# Patient Record
Sex: Female | Born: 1970
Health system: Southern US, Community
[De-identification: ages and names within clinical notes are randomized; demographics above are authoritative.]

## PROBLEM LIST (undated history)

## (undated) ENCOUNTER — Ambulatory Visit: Admission: EM | Payer: Self-pay

## (undated) DIAGNOSIS — H9011 Conductive hearing loss, unilateral, right ear, with unrestricted hearing on the contralateral side: Secondary | ICD-10-CM

## (undated) DIAGNOSIS — M0820 Juvenile rheumatoid arthritis with systemic onset, unspecified site: Secondary | ICD-10-CM

## (undated) DIAGNOSIS — F32A Depression, unspecified: Secondary | ICD-10-CM

## (undated) DIAGNOSIS — M1990 Unspecified osteoarthritis, unspecified site: Secondary | ICD-10-CM

## (undated) DIAGNOSIS — H9012 Conductive hearing loss, unilateral, left ear, with unrestricted hearing on the contralateral side: Secondary | ICD-10-CM

## (undated) DIAGNOSIS — F909 Attention-deficit hyperactivity disorder, unspecified type: Secondary | ICD-10-CM

## (undated) DIAGNOSIS — F329 Major depressive disorder, single episode, unspecified: Secondary | ICD-10-CM

## (undated) DIAGNOSIS — T7840XA Allergy, unspecified, initial encounter: Secondary | ICD-10-CM

## (undated) DIAGNOSIS — E785 Hyperlipidemia, unspecified: Secondary | ICD-10-CM

## (undated) HISTORY — DX: Juvenile rheumatoid arthritis with systemic onset, unspecified site: M08.20

## (undated) HISTORY — DX: Depression, unspecified: F32.A

## (undated) HISTORY — DX: Hyperlipidemia, unspecified: E78.5

## (undated) HISTORY — DX: Allergy, unspecified, initial encounter: T78.40XA

## (undated) HISTORY — DX: Unspecified osteoarthritis, unspecified site: M19.90

## (undated) HISTORY — DX: Major depressive disorder, single episode, unspecified: F32.9

## (undated) HISTORY — DX: Conductive hearing loss, unilateral, left ear, with unrestricted hearing on the contralateral side: H90.12

## (undated) HISTORY — DX: Attention-deficit hyperactivity disorder, unspecified type: F90.9

## (undated) HISTORY — DX: Conductive hearing loss, unilateral, right ear, with unrestricted hearing on the contralateral side: H90.11

## (undated) HISTORY — PX: TUBAL LIGATION: SHX77

---

## 1997-07-09 ENCOUNTER — Other Ambulatory Visit: Admission: RE | Admit: 1997-07-09 | Discharge: 1997-07-09 | Payer: Self-pay | Admitting: Obstetrics and Gynecology

## 1997-09-20 ENCOUNTER — Inpatient Hospital Stay (HOSPITAL_COMMUNITY): Admission: AD | Admit: 1997-09-20 | Discharge: 1997-09-20 | Payer: Self-pay | Admitting: Obstetrics and Gynecology

## 1997-11-16 ENCOUNTER — Ambulatory Visit (HOSPITAL_COMMUNITY): Admission: RE | Admit: 1997-11-16 | Discharge: 1997-11-16 | Payer: Self-pay | Admitting: Obstetrics and Gynecology

## 1998-02-01 ENCOUNTER — Inpatient Hospital Stay (HOSPITAL_COMMUNITY): Admission: AD | Admit: 1998-02-01 | Discharge: 1998-02-04 | Payer: Self-pay | Admitting: Obstetrics and Gynecology

## 1998-03-28 ENCOUNTER — Other Ambulatory Visit: Admission: RE | Admit: 1998-03-28 | Discharge: 1998-03-28 | Payer: Self-pay | Admitting: Obstetrics and Gynecology

## 2001-11-18 ENCOUNTER — Other Ambulatory Visit: Admission: RE | Admit: 2001-11-18 | Discharge: 2001-11-18 | Payer: Self-pay | Admitting: Obstetrics and Gynecology

## 2003-03-29 ENCOUNTER — Other Ambulatory Visit: Admission: RE | Admit: 2003-03-29 | Discharge: 2003-03-29 | Payer: Self-pay | Admitting: Obstetrics and Gynecology

## 2003-05-01 HISTORY — PX: SEPTOPLASTY: SUR1290

## 2004-04-28 ENCOUNTER — Ambulatory Visit: Payer: Self-pay | Admitting: Unknown Physician Specialty

## 2004-06-02 ENCOUNTER — Other Ambulatory Visit: Admission: RE | Admit: 2004-06-02 | Discharge: 2004-06-02 | Payer: Self-pay | Admitting: Obstetrics and Gynecology

## 2005-04-30 HISTORY — PX: LAPAROSCOPIC ENDOMETRIOSIS FULGURATION: SUR769

## 2007-06-07 ENCOUNTER — Ambulatory Visit: Payer: Self-pay | Admitting: Emergency Medicine

## 2014-08-30 ENCOUNTER — Other Ambulatory Visit: Payer: Self-pay | Admitting: Otolaryngology

## 2014-08-30 DIAGNOSIS — E041 Nontoxic single thyroid nodule: Secondary | ICD-10-CM

## 2014-08-31 ENCOUNTER — Ambulatory Visit
Admission: RE | Admit: 2014-08-31 | Discharge: 2014-08-31 | Disposition: A | Discharge status: Home / Self Care | Payer: BLUE CROSS/BLUE SHIELD | Source: Ambulatory Visit | Attending: Otolaryngology | Admitting: Otolaryngology

## 2014-08-31 DIAGNOSIS — E041 Nontoxic single thyroid nodule: Secondary | ICD-10-CM | POA: Diagnosis present

## 2014-08-31 DIAGNOSIS — E042 Nontoxic multinodular goiter: Secondary | ICD-10-CM | POA: Diagnosis not present

## 2014-12-30 ENCOUNTER — Other Ambulatory Visit: Payer: Self-pay | Admitting: Family Medicine

## 2014-12-30 ENCOUNTER — Telehealth: Payer: Self-pay

## 2014-12-30 MED ORDER — AMPHETAMINE-DEXTROAMPHETAMINE 20 MG PO TABS: 20.0000 mg | ORAL_TABLET | Freq: Two times a day (BID) | ORAL | Status: DC

## 2014-12-30 NOTE — Telephone Encounter (Signed)
Refill for Amphetamine Salts 20 mg 1tab BID  [address]

## 2015-03-30 ENCOUNTER — Other Ambulatory Visit: Payer: Self-pay | Admitting: Family Medicine

## 2015-07-20 ENCOUNTER — Other Ambulatory Visit: Payer: Self-pay | Admitting: Family Medicine

## 2015-08-05 ENCOUNTER — Telehealth: Payer: Self-pay | Admitting: Family Medicine

## 2015-08-05 NOTE — Telephone Encounter (Signed)
Pt came in looking for refill on Adderrall. No RX up front. Pt very upset. Pt has not been seen since May 2016. Dr. Laural BenesJohnson refused to write. Please call when RX is ready or please advise of next steps. Thanks.

## 2015-08-08 ENCOUNTER — Other Ambulatory Visit: Payer: Self-pay | Admitting: Family Medicine

## 2015-08-08 MED ORDER — AMPHETAMINE-DEXTROAMPHETAMINE 20 MG PO TABS: 20.0000 mg | ORAL_TABLET | Freq: Two times a day (BID) | ORAL | Status: DC

## 2015-08-08 NOTE — Telephone Encounter (Signed)
No answer, will call later (left VM)

## 2015-08-08 NOTE — Telephone Encounter (Signed)
Please check phone as pt waited 9.5 min for phone to be answered today Girl at front dest very poor.

## 2015-08-08 NOTE — Telephone Encounter (Signed)
Call pt 

## 2015-08-15 NOTE — Telephone Encounter (Signed)
Dr. Dossie Arbourrissman,  I will reach out to the patient to discuss her concerns.  I would also like to speak with you regarding Stephanie's feedback as well.  I'll try to catch you for a few minutes during lunch.  Thanks!

## 2015-08-30 ENCOUNTER — Other Ambulatory Visit: Payer: Self-pay | Admitting: Otolaryngology

## 2015-08-30 DIAGNOSIS — E041 Nontoxic single thyroid nodule: Secondary | ICD-10-CM

## 2015-09-06 ENCOUNTER — Ambulatory Visit
Admission: RE | Admit: 2015-09-06 | Discharge: 2015-09-06 | Disposition: A | Discharge status: Home / Self Care | Payer: BLUE CROSS/BLUE SHIELD | Source: Ambulatory Visit | Attending: Otolaryngology | Admitting: Otolaryngology

## 2015-09-06 DIAGNOSIS — E041 Nontoxic single thyroid nodule: Secondary | ICD-10-CM

## 2015-09-07 ENCOUNTER — Ambulatory Visit (INDEPENDENT_AMBULATORY_CARE_PROVIDER_SITE_OTHER): Payer: BLUE CROSS/BLUE SHIELD | Admitting: Family Medicine

## 2015-09-07 ENCOUNTER — Encounter: Payer: Self-pay | Admitting: Family Medicine

## 2015-09-07 VITALS — BP 121/76 | HR 82 | Temp 98.2°F | Ht 70.0 in | Wt 233.0 lb

## 2015-09-07 DIAGNOSIS — F900 Attention-deficit hyperactivity disorder, predominantly inattentive type: Secondary | ICD-10-CM | POA: Diagnosis not present

## 2015-09-07 DIAGNOSIS — F329 Major depressive disorder, single episode, unspecified: Secondary | ICD-10-CM | POA: Diagnosis not present

## 2015-09-07 DIAGNOSIS — F909 Attention-deficit hyperactivity disorder, unspecified type: Secondary | ICD-10-CM

## 2015-09-07 DIAGNOSIS — Z23 Encounter for immunization: Secondary | ICD-10-CM | POA: Diagnosis not present

## 2015-09-07 DIAGNOSIS — F32A Depression, unspecified: Secondary | ICD-10-CM

## 2015-09-07 MED ORDER — AMPHETAMINE-DEXTROAMPHETAMINE 20 MG PO TABS: 20.0000 mg | ORAL_TABLET | Freq: Two times a day (BID) | ORAL | Status: DC

## 2015-09-07 NOTE — Assessment & Plan Note (Signed)
The current medical regimen is effective;  continue present plan and medications.  

## 2015-09-07 NOTE — Assessment & Plan Note (Signed)
Doing well with meds no side effects 

## 2015-09-07 NOTE — Progress Notes (Signed)
   BP 121/76 mmHg  Pulse 82  Temp(Src) 98.2 F (36.8 C)  Ht 5\' 10"  (1.778 m)  Wt 233 lb (105.688 kg)  BMI 33.43 kg/m2  SpO2 99%   Subjective:    Patient ID: Cassandra Robbins, female    DOB: 21-Nov-1970, 45 y.o.   MRN: [id number]  HPI: Cassandra Robbins is a 45 y.o. female  Chief Complaint  Patient presents with  . ADHD   Patient doing well on medication sleeping well no complaints medication helping takes most days with occasional weekends off a lot of times doesn't take 2 a day and doesn't take past 2:00 in the afternoon. Depression nerves doing well with no complaints from medication taken faithfully without problems Relevant past medical, surgical, family and social history reviewed and updated as indicated. Interim medical history since our last visit reviewed. Allergies and medications reviewed and updated.  Review of Systems  Constitutional: Negative.   Respiratory: Negative.   Cardiovascular: Negative.     Per HPI unless specifically indicated above     Objective:    BP 121/76 mmHg  Pulse 82  Temp(Src) 98.2 F (36.8 C)  Ht 5\' 10"  (1.778 m)  Wt 233 lb (105.688 kg)  BMI 33.43 kg/m2  SpO2 99%  Wt Readings from Last 3 Encounters:  09/07/15 233 lb (105.688 kg)  08/31/14 222 lb (100.699 kg)    Physical Exam  Constitutional: She is oriented to person, place, and time. She appears well-developed and well-nourished. No distress.  HENT:  Head: Normocephalic and atraumatic.  Right Ear: Hearing normal.  Left Ear: Hearing normal.  Nose: Nose normal.  Eyes: Conjunctivae and lids are normal. Right eye exhibits no discharge. Left eye exhibits no discharge. No scleral icterus.  Cardiovascular: Normal rate, regular rhythm and normal heart sounds.   Pulmonary/Chest: Effort normal and breath sounds normal. No respiratory distress.  Musculoskeletal: Normal range of motion.  Neurological: She is alert and oriented to person, place, and time.  Skin: Skin is intact. No rash  noted.  Psychiatric: She has a normal mood and affect. Her speech is normal and behavior is normal. Judgment and thought content normal. Cognition and memory are normal.    No results found for this or any previous visit.    Assessment & Plan:   Problem List Items Addressed This Visit      Other   Adult ADHD    Doing well with meds no side effects      Depression    The current medical regimen is effective;  continue present plan and medications.        Other Visit Diagnoses    Immunization due    -  Primary    Relevant Medications    fluticasone (FLONASE) 50 MCG/ACT nasal spray    Other Relevant Orders    Tdap vaccine greater than or equal to 7yo IM (Completed)        Follow up plan: Return in about 6 months (around 03/09/2016) for ADHD check.

## 2015-09-07 NOTE — Patient Instructions (Signed)
Tdap Vaccine (Tetanus, Diphtheria and Pertussis): What You Need to Know 1. Why get vaccinated? Tetanus, diphtheria and pertussis are very serious diseases. Tdap vaccine can protect us from these diseases. And, Tdap vaccine given to pregnant women can protect newborn babies against pertussis. TETANUS (Lockjaw) is rare in the [place] today. It causes painful muscle tightening and stiffness, usually all over the body.  It can lead to tightening of muscles in the head and neck so you can't open your mouth, swallow, or sometimes even breathe. Tetanus kills about 1 out of 10 people who are infected even after receiving the best medical care. DIPHTHERIA is also rare in the [place] today. It can cause a thick coating to form in the back of the throat.  It can lead to breathing problems, heart failure, paralysis, and death. PERTUSSIS (Whooping Cough) causes severe coughing spells, which can cause difficulty breathing, vomiting and disturbed sleep.  It can also lead to weight loss, incontinence, and rib fractures. Up to 2 in 100 adolescents and 5 in 100 adults with pertussis are hospitalized or have complications, which could include pneumonia or death. These diseases are caused by bacteria. Diphtheria and pertussis are spread from person to person through secretions from coughing or sneezing. Tetanus enters the body through cuts, scratches, or wounds. Before vaccines, as many as 200,000 cases of diphtheria, 200,000 cases of pertussis, and hundreds of cases of tetanus, were reported in the [place] each year. Since vaccination began, reports of cases for tetanus and diphtheria have dropped by about 99% and for pertussis by about 80%. 2. Tdap vaccine Tdap vaccine can protect adolescents and adults from tetanus, diphtheria, and pertussis. One dose of Tdap is routinely given at age 11 or 12. People who did not get Tdap at that age should get it as soon as possible. Tdap is especially important  for healthcare professionals and anyone having close contact with a baby younger than 12 months. Pregnant women should get a dose of Tdap during every pregnancy, to protect the newborn from pertussis. Infants are most at risk for severe, life-threatening complications from pertussis. Another vaccine, called Td, protects against tetanus and diphtheria, but not pertussis. A Td booster should be given every 10 years. Tdap may be given as one of these boosters if you have never gotten Tdap before. Tdap may also be given after a severe cut or burn to prevent tetanus infection. Your doctor or the person giving you the vaccine can give you more information. Tdap may safely be given at the same time as other vaccines. 3. Some people should not get this vaccine  A person who has ever had a life-threatening allergic reaction after a previous dose of any diphtheria, tetanus or pertussis containing vaccine, OR has a severe allergy to any part of this vaccine, should not get Tdap vaccine. Tell the person giving the vaccine about any severe allergies.  Anyone who had coma or long repeated seizures within 7 days after a childhood dose of DTP or DTaP, or a previous dose of Tdap, should not get Tdap, unless a cause other than the vaccine was found. They can still get Td.  Talk to your doctor if you:  have seizures or another nervous system problem,  had severe pain or swelling after any vaccine containing diphtheria, tetanus or pertussis,  ever had a condition called Guillain-Barr Syndrome (GBS),  aren't feeling well on the day the shot is scheduled. 4. Risks With any medicine, including vaccines, there is   a chance of side effects. These are usually mild and go away on their own. Serious reactions are also possible but are rare. Most people who get Tdap vaccine do not have any problems with it. Mild problems following Tdap (Did not interfere with activities)  Pain where the shot was given (about 3 in 4  adolescents or 2 in 3 adults)  Redness or swelling where the shot was given (about 1 person in 5)  Mild fever of at least 100.4F (up to about 1 in 25 adolescents or 1 in 100 adults)  Headache (about 3 or 4 people in 10)  Tiredness (about 1 person in 3 or 4)  Nausea, vomiting, diarrhea, stomach ache (up to 1 in 4 adolescents or 1 in 10 adults)  Chills, sore joints (about 1 person in 10)  Body aches (about 1 person in 3 or 4)  Rash, swollen glands (uncommon) Moderate problems following Tdap (Interfered with activities, but did not require medical attention)  Pain where the shot was given (up to 1 in 5 or 6)  Redness or swelling where the shot was given (up to about 1 in 16 adolescents or 1 in 12 adults)  Fever over 102F (about 1 in 100 adolescents or 1 in 250 adults)  Headache (about 1 in 7 adolescents or 1 in 10 adults)  Nausea, vomiting, diarrhea, stomach ache (up to 1 or 3 people in 100)  Swelling of the entire arm where the shot was given (up to about 1 in 500). Severe problems following Tdap (Unable to perform usual activities; required medical attention)  Swelling, severe pain, bleeding and redness in the arm where the shot was given (rare). Problems that could happen after any vaccine:  People sometimes faint after a medical procedure, including vaccination. Sitting or lying down for about 15 minutes can help prevent fainting, and injuries caused by a fall. Tell your doctor if you feel dizzy, or have vision changes or ringing in the ears.  Some people get severe pain in the shoulder and have difficulty moving the arm where a shot was given. This happens very rarely.  Any medication can cause a severe allergic reaction. Such reactions from a vaccine are very rare, estimated at fewer than 1 in a million doses, and would happen within a few minutes to a few hours after the vaccination. As with any medicine, there is a very remote chance of a vaccine causing a serious  injury or death. The safety of vaccines is always being monitored. For more information, visit: www.cdc.gov/vaccinesafety/ 5. What if there is a serious problem? What should I look for?  Look for anything that concerns you, such as signs of a severe allergic reaction, very high fever, or unusual behavior.  Signs of a severe allergic reaction can include hives, swelling of the face and throat, difficulty breathing, a fast heartbeat, dizziness, and weakness. These would usually start a few minutes to a few hours after the vaccination. What should I do?  If you think it is a severe allergic reaction or other emergency that can't wait, call 9-1-1 or get the person to the nearest hospital. Otherwise, call your doctor.  Afterward, the reaction should be reported to the Vaccine Adverse Event Reporting System (VAERS). Your doctor might file this report, or you can do it yourself through the VAERS web site at www.vaers.hhs.gov, or by calling 1-800-822-7967. VAERS does not give medical advice.  6. The National Vaccine Injury Compensation Program The National Vaccine Injury Compensation Program (  VICP) is a federal program that was created to compensate people who may have been injured by certain vaccines. Persons who believe they may have been injured by a vaccine can learn about the program and about filing a claim by calling 1-800-338-2382 or visiting the VICP website at www.hrsa.gov/vaccinecompensation. There is a time limit to file a claim for compensation. 7. How can I learn more?  Ask your doctor. He or she can give you the vaccine package insert or suggest other sources of information.  Call your local or state health department.  Contact the Centers for Disease Control and Prevention (CDC):  Call 1-800-232-4636 (1-800-CDC-INFO) or  Visit CDC's website at www.cdc.gov/vaccines CDC Tdap Vaccine VIS (06/23/13)   This information is not intended to replace advice given to you by your health care  provider. Make sure you discuss any questions you have with your health care provider.   Document Released: 10/16/2011 Document Revised: 05/07/2014 Document Reviewed: 07/29/2013 Elsevier Interactive Patient Education 2016 Elsevier Inc.  

## 2016-01-20 ENCOUNTER — Other Ambulatory Visit: Payer: Self-pay | Admitting: Family Medicine

## 2016-03-08 ENCOUNTER — Ambulatory Visit (INDEPENDENT_AMBULATORY_CARE_PROVIDER_SITE_OTHER): Payer: BLUE CROSS/BLUE SHIELD | Admitting: Family Medicine

## 2016-03-08 ENCOUNTER — Encounter: Payer: Self-pay | Admitting: Family Medicine

## 2016-03-08 VITALS — BP 118/74 | HR 89 | Temp 98.3°F | Ht 70.1 in | Wt 226.4 lb

## 2016-03-08 DIAGNOSIS — Z23 Encounter for immunization: Secondary | ICD-10-CM

## 2016-03-08 DIAGNOSIS — Z0000 Encounter for general adult medical examination without abnormal findings: Secondary | ICD-10-CM | POA: Diagnosis not present

## 2016-03-08 LAB — URINALYSIS, ROUTINE W REFLEX MICROSCOPIC
Bilirubin, UA: NEGATIVE
Glucose, UA: NEGATIVE
Ketones, UA: NEGATIVE
Leukocytes, UA: NEGATIVE
Nitrite, UA: NEGATIVE
Protein, UA: NEGATIVE
RBC, UA: NEGATIVE
Specific Gravity, UA: 1.015 (ref 1.005–1.030)
Urobilinogen, Ur: 0.2 mg/dL (ref 0.2–1.0)
pH, UA: 6.5 (ref 5.0–7.5)

## 2016-03-08 MED ORDER — AMPHETAMINE-DEXTROAMPHETAMINE 20 MG PO TABS: 20.0000 mg | ORAL_TABLET | Freq: Two times a day (BID) | ORAL | 0 refills | Status: DC

## 2016-03-08 NOTE — Patient Instructions (Signed)

## 2016-03-08 NOTE — Progress Notes (Signed)
BP 118/74 (BP Location: Left Arm, Patient Position: Sitting, Cuff Size: Large)   Pulse 89   Temp 98.3 F (36.8 C)   Ht 5' 10.1" (1.781 m)   Wt 226 lb 6.4 oz (102.7 kg)   LMP 03/05/2016 (Exact Date)   SpO2 98%   BMI 32.39 kg/m    Subjective:    Patient ID: Cassandra Robbins, female    DOB: 18-May-1970, 45 y.o.   MRN: [id number]  HPI: Cassandra Robbins is a 45 y.o. female  Chief Complaint  Patient presents with  . ADHD  . Annual Exam   Patient doing well ADHD stable has some side effects of grinding her teeth at night. Has stopped and started medication and this problem stopped and started. Patient's been to her dentist and has a mouth guard ordered which will be coming soon. Going to GYN for breast exam pelvic exam No GYN complaints Relevant past medical, surgical, family and social history reviewed and updated as indicated. Interim medical history since our last visit reviewed. Allergies and medications reviewed and updated.  Review of Systems  Constitutional: Negative.   HENT: Negative.   Eyes: Negative.   Respiratory: Negative.   Cardiovascular: Negative.   Gastrointestinal: Negative.   Endocrine: Negative.   Genitourinary: Negative.   Musculoskeletal: Negative.   Skin: Negative.   Allergic/Immunologic: Negative.   Neurological: Negative.   Hematological: Negative.   Psychiatric/Behavioral: Negative.     Per HPI unless specifically indicated above     Objective:    BP 118/74 (BP Location: Left Arm, Patient Position: Sitting, Cuff Size: Large)   Pulse 89   Temp 98.3 F (36.8 C)   Ht 5' 10.1" (1.781 m)   Wt 226 lb 6.4 oz (102.7 kg)   LMP 03/05/2016 (Exact Date)   SpO2 98%   BMI 32.39 kg/m   Wt Readings from Last 3 Encounters:  03/08/16 226 lb 6.4 oz (102.7 kg)  09/07/15 233 lb (105.7 kg)  08/31/14 222 lb (100.7 kg)    Physical Exam  Constitutional: She is oriented to person, place, and time. She appears well-developed and well-nourished.  HENT:  Head:  Normocephalic and atraumatic.  Right Ear: External ear normal.  Left Ear: External ear normal.  Nose: Nose normal.  Mouth/Throat: Oropharynx is clear and moist.  Eyes: Conjunctivae and EOM are normal. Pupils are equal, round, and reactive to light.  Neck: Normal range of motion. Neck supple. Carotid bruit is not present.  Cardiovascular: Normal rate, regular rhythm and normal heart sounds.   No murmur heard. Pulmonary/Chest: Effort normal and breath sounds normal.  Abdominal: Soft. Bowel sounds are normal. There is no hepatosplenomegaly.  Musculoskeletal: Normal range of motion.  Neurological: She is alert and oriented to person, place, and time.  Skin: No rash noted.  Psychiatric: She has a normal mood and affect. Her behavior is normal. Judgment and thought content normal.    No results found for this or any previous visit.    Assessment & Plan:   Problem List Items Addressed This Visit    None    Visit Diagnoses    Healthcare maintenance    -  Primary   Relevant Orders   HIV antibody   Need for influenza vaccination       Relevant Orders   Flu Vaccine QUAD 36+ mos IM   Routine general medical examination at a health care facility       Relevant Orders   CBC with Differential/Platelet   Comprehensive  metabolic panel   TSH   Lipid panel   Urinalysis, Routine w reflex microscopic (not at [facility])       Follow up plan: Return in about 6 months (around 09/05/2016) for ADHD check.

## 2016-03-08 NOTE — Assessment & Plan Note (Signed)
The current medical regimen is effective;  continue present plan and medications.  

## 2016-03-09 LAB — LIPID PANEL
CHOL/HDL RATIO: 3.2 ratio (ref 0.0–4.4)
Cholesterol, Total: 199 mg/dL (ref 100–199)
HDL: 63 mg/dL (ref >39)
LDL CALC: 123 mg/dL — AB (ref 0–99)
TRIGLYCERIDES: 66 mg/dL (ref 0–149)
VLDL CHOLESTEROL CAL: 13 mg/dL (ref 5–40)

## 2016-03-09 LAB — CBC WITH DIFFERENTIAL/PLATELET
BASOS ABS: 0 10*3/uL (ref 0.0–0.2)
BASOS: 1 %
EOS (ABSOLUTE): 0.2 10*3/uL (ref 0.0–0.4)
Eos: 4 %
HEMOGLOBIN: 14.2 g/dL (ref 11.1–15.9)
Hematocrit: 41.7 % (ref 34.0–46.6)
IMMATURE GRANS (ABS): 0 10*3/uL (ref 0.0–0.1)
IMMATURE GRANULOCYTES: 0 %
LYMPHS: 34 %
Lymphocytes Absolute: 1.7 10*3/uL (ref 0.7–3.1)
MCH: 29.8 pg (ref 26.6–33.0)
MCHC: 34.1 g/dL (ref 31.5–35.7)
MCV: 87 fL (ref 79–97)
MONOCYTES: 6 %
Monocytes Absolute: 0.3 10*3/uL (ref 0.1–0.9)
NEUTROS ABS: 2.8 10*3/uL (ref 1.4–7.0)
NEUTROS PCT: 55 %
PLATELETS: 374 10*3/uL (ref 150–379)
RBC: 4.77 x10E6/uL (ref 3.77–5.28)
RDW: 12.5 % (ref 12.3–15.4)
WBC: 5 10*3/uL (ref 3.4–10.8)

## 2016-03-09 LAB — COMPREHENSIVE METABOLIC PANEL
ALBUMIN: 4.3 g/dL (ref 3.5–5.5)
ALT: 16 IU/L (ref 0–32)
AST: 15 IU/L (ref 0–40)
Albumin/Globulin Ratio: 1.4 (ref 1.2–2.2)
Alkaline Phosphatase: 97 IU/L (ref 39–117)
BUN / CREAT RATIO: 20 (ref 9–23)
BUN: 17 mg/dL (ref 6–24)
Bilirubin Total: 0.5 mg/dL (ref 0.0–1.2)
CALCIUM: 9.6 mg/dL (ref 8.7–10.2)
CO2: 26 mmol/L (ref 18–29)
Chloride: 98 mmol/L (ref 96–106)
Creatinine, Ser: 0.85 mg/dL (ref 0.57–1.00)
GFR, EST AFRICAN AMERICAN: 95 mL/min/{1.73_m2} (ref >59)
GFR, EST NON AFRICAN AMERICAN: 82 mL/min/{1.73_m2} (ref >59)
GLUCOSE: 92 mg/dL (ref 65–99)
Globulin, Total: 3 g/dL (ref 1.5–4.5)
Potassium: 4.7 mmol/L (ref 3.5–5.2)
Sodium: 140 mmol/L (ref 134–144)
TOTAL PROTEIN: 7.3 g/dL (ref 6.0–8.5)

## 2016-03-09 LAB — HIV ANTIBODY (ROUTINE TESTING W REFLEX): HIV Screen 4th Generation wRfx: NONREACTIVE

## 2016-03-09 LAB — TSH: TSH: 0.783 u[IU]/mL (ref 0.450–4.500)

## 2016-03-12 ENCOUNTER — Encounter: Payer: Self-pay | Admitting: Family Medicine

## 2016-06-11 ENCOUNTER — Other Ambulatory Visit: Payer: Self-pay

## 2016-06-11 MED ORDER — AMPHETAMINE-DEXTROAMPHETAMINE 20 MG PO TABS: 20.0000 mg | ORAL_TABLET | Freq: Two times a day (BID) | ORAL | 0 refills | Status: DC

## 2016-06-11 NOTE — Telephone Encounter (Signed)
Pt stopped by for refill on adderall. Pt requested 90 day supply, advised I was unsure we would write for 90 days but did agree to pass info on to provider. Pt was last given refill in November - 30 day supply. Please advise.

## 2016-06-11 NOTE — Telephone Encounter (Signed)
90 days ok

## 2016-09-05 ENCOUNTER — Ambulatory Visit (INDEPENDENT_AMBULATORY_CARE_PROVIDER_SITE_OTHER): Payer: BLUE CROSS/BLUE SHIELD | Admitting: Family Medicine

## 2016-09-05 ENCOUNTER — Encounter: Payer: Self-pay | Admitting: Family Medicine

## 2016-09-05 VITALS — BP 112/58 | HR 91 | Wt 230.0 lb

## 2016-09-05 DIAGNOSIS — F909 Attention-deficit hyperactivity disorder, unspecified type: Secondary | ICD-10-CM | POA: Diagnosis not present

## 2016-09-05 MED ORDER — AMPHETAMINE-DEXTROAMPHETAMINE 20 MG PO TABS: 20.0000 mg | ORAL_TABLET | Freq: Two times a day (BID) | ORAL | 0 refills | Status: DC

## 2016-09-05 NOTE — Progress Notes (Signed)
BP (!) 112/58   Pulse 91   Wt 230 lb (104.3 kg)   SpO2 97%   BMI 32.91 kg/m    Subjective:    Patient ID: Cassandra Robbins, female    DOB: 28-Feb-1971, 46 y.o.   MRN: [id number]  HPI: Cassandra Cowingithiel L Heyer is a 46 y.o. female  Chief Complaint  Patient presents with  . Follow-up    ADHD  . Skin check    left side face/cheek  . Hand Injury    Pointer finger, joint. Cyst?   Patient all in all for ADHD doing okay mostly taken Adderall one a day instead of 2 a day has some cheek chewing issues that to a day. Works going well. 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 On left index finger anterior surface has small wartlike lesions superficially. Also has small seborrheic keratosis on left cheek  Relevant past medical, surgical, family and social history reviewed and updated as indicated. Interim medical history since our last visit reviewed. Allergies and medications reviewed and updated.  Review of Systems  Constitutional: Negative.   Respiratory: Negative.   Cardiovascular: Negative.     Per HPI unless specifically indicated above     Objective:    BP (!) 112/58   Pulse 91   Wt 230 lb (104.3 kg)   SpO2 97%   BMI 32.91 kg/m   Wt Readings from Last 3 Encounters:  09/05/16 230 lb (104.3 kg)  03/08/16 226 lb 6.4 oz (102.7 kg)  09/07/15 233 lb (105.7 kg)    Physical Exam  Constitutional: She is oriented to person, place, and time. She appears well-developed and well-nourished. No distress.  HENT:  Head: Normocephalic and atraumatic.  Right Ear: Hearing normal.  Left Ear: Hearing normal.  Nose: Nose normal.  Eyes: Conjunctivae and lids are normal. Right eye exhibits no discharge. Left eye exhibits no discharge. No scleral icterus.  Pulmonary/Chest: Effort normal. No respiratory distress.  Musculoskeletal: Normal range of motion.  Neurological: She is alert and oriented to person, place, and time.  Skin: Skin is intact. No rash noted.  Skin lesions as noted above    Psychiatric: She has a normal mood and affect. Her speech is normal and behavior is normal. Judgment and thought content normal. Cognition and memory are normal.    Results for orders placed or performed in visit on 03/08/16  HIV antibody  Result Value Ref Range   HIV Screen 4th Generation wRfx Non Reactive Non Reactive  CBC with Differential/Platelet  Result Value Ref Range   WBC 5.0 3.4 - 10.8 x10E3/uL   RBC 4.77 3.77 - 5.28 x10E6/uL   Hemoglobin 14.2 11.1 - 15.9 g/dL   Hematocrit 40.941.7 81.134.0 - 46.6 %   MCV 87 79 - 97 fL   MCH 29.8 26.6 - 33.0 pg   MCHC 34.1 31.5 - 35.7 g/dL   RDW 91.412.5 78.212.3 - 95.615.4 %   Platelets 374 150 - 379 x10E3/uL   Neutrophils 55 Not Estab. %   Lymphs 34 Not Estab. %   Monocytes 6 Not Estab. %   Eos 4 Not Estab. %   Basos 1 Not Estab. %   Neutrophils Absolute 2.8 1.4 - 7.0 x10E3/uL   Lymphocytes Absolute 1.7 0.7 - 3.1 x10E3/uL   Monocytes Absolute 0.3 0.1 - 0.9 x10E3/uL   EOS (ABSOLUTE) 0.2 0.0 - 0.4 x10E3/uL   Basophils Absolute 0.0 0.0 - 0.2 x10E3/uL   Immature Granulocytes 0 Not Estab. %  Immature Grans (Abs) 0.0 0.0 - 0.1 x10E3/uL  Comprehensive metabolic panel  Result Value Ref Range   Glucose 92 65 - 99 mg/dL   BUN 17 6 - 24 mg/dL   Creatinine, Ser 1.61 0.57 - 1.00 mg/dL   GFR calc non Af Amer 82 >59 mL/min/1.73   GFR calc Af Amer 95 >59 mL/min/1.73   BUN/Creatinine Ratio 20 9 - 23   Sodium 140 134 - 144 mmol/L   Potassium 4.7 3.5 - 5.2 mmol/L   Chloride 98 96 - 106 mmol/L   CO2 26 18 - 29 mmol/L   Calcium 9.6 8.7 - 10.2 mg/dL   Total Protein 7.3 6.0 - 8.5 g/dL   Albumin 4.3 3.5 - 5.5 g/dL   Globulin, Total 3.0 1.5 - 4.5 g/dL   Albumin/Globulin Ratio 1.4 1.2 - 2.2   Bilirubin Total 0.5 0.0 - 1.2 mg/dL   Alkaline Phosphatase 97 39 - 117 IU/L   AST 15 0 - 40 IU/L   ALT 16 0 - 32 IU/L  TSH  Result Value Ref Range   TSH 0.783 0.450 - 4.500 uIU/mL  Lipid panel  Result Value Ref Range   Cholesterol, Total 199 100 - 199 mg/dL    Triglycerides 66 0 - 149 mg/dL   HDL 63 >09 mg/dL   VLDL Cholesterol Cal 13 5 - 40 mg/dL   LDL Calculated 604 (H) 0 - 99 mg/dL   Chol/HDL Ratio 3.2 0.0 - 4.4 ratio units  Urinalysis, Routine w reflex microscopic (not at [facility])  Result Value Ref Range   Specific Gravity, UA 1.015 1.005 - 1.030   pH, UA 6.5 5.0 - 7.5   Color, UA Yellow Yellow   Appearance Ur Clear Clear   Leukocytes, UA Negative Negative   Protein, UA Negative Negative/Trace   Glucose, UA Negative Negative   Ketones, UA Negative Negative   RBC, UA Negative Negative   Bilirubin, UA Negative Negative   Urobilinogen, Ur 0.2 0.2 - 1.0 mg/dL   Nitrite, UA Negative Negative      Assessment & Plan:   Problem List Items Addressed This Visit      Other   Adult ADHD - Primary    The current medical regimen is effective;  continue present plan and medications.           Follow up plan: Return in about 6 months (around 03/08/2017) for Physical Exam.

## 2016-09-05 NOTE — Assessment & Plan Note (Signed)
The current medical regimen is effective;  continue present plan and medications.  

## 2016-09-11 ENCOUNTER — Other Ambulatory Visit: Payer: Self-pay | Admitting: Otolaryngology

## 2016-09-11 DIAGNOSIS — E041 Nontoxic single thyroid nodule: Secondary | ICD-10-CM

## 2016-09-12 ENCOUNTER — Other Ambulatory Visit: Payer: Self-pay | Admitting: Otolaryngology

## 2016-09-12 DIAGNOSIS — E041 Nontoxic single thyroid nodule: Secondary | ICD-10-CM

## 2017-03-18 ENCOUNTER — Ambulatory Visit: Payer: BLUE CROSS/BLUE SHIELD | Admitting: Family Medicine

## 2017-03-18 ENCOUNTER — Encounter: Payer: Self-pay | Admitting: Family Medicine

## 2017-03-18 VITALS — BP 118/80 | HR 80 | Temp 98.5°F | Ht 70.87 in | Wt 235.0 lb

## 2017-03-18 DIAGNOSIS — Z23 Encounter for immunization: Secondary | ICD-10-CM

## 2017-03-18 DIAGNOSIS — Z0000 Encounter for general adult medical examination without abnormal findings: Secondary | ICD-10-CM

## 2017-03-18 DIAGNOSIS — F909 Attention-deficit hyperactivity disorder, unspecified type: Secondary | ICD-10-CM

## 2017-03-18 LAB — MICROSCOPIC EXAMINATION: BACTERIA UA: NONE SEEN

## 2017-03-18 LAB — URINALYSIS, ROUTINE W REFLEX MICROSCOPIC
Bilirubin, UA: NEGATIVE
Glucose, UA: NEGATIVE
KETONES UA: NEGATIVE
NITRITE UA: NEGATIVE
PH UA: 6.5 (ref 5.0–7.5)
Protein, UA: NEGATIVE
RBC, UA: NEGATIVE
Specific Gravity, UA: 1.01 (ref 1.005–1.030)
Urobilinogen, Ur: 0.2 mg/dL (ref 0.2–1.0)

## 2017-03-18 MED ORDER — AMPHETAMINE-DEXTROAMPHETAMINE 20 MG PO TABS: 20.0000 mg | ORAL_TABLET | Freq: Two times a day (BID) | ORAL | 0 refills | Status: DC

## 2017-03-18 MED ORDER — FLUTICASONE PROPIONATE 50 MCG/ACT NA SUSP: 2.0000 | Freq: Every day | NASAL | 11 refills | Status: DC

## 2017-03-18 NOTE — Assessment & Plan Note (Signed)
The current medical regimen is effective;  continue present plan and medications.  

## 2017-03-18 NOTE — Progress Notes (Signed)
BP 118/80   Pulse 80   Temp 98.5 F (36.9 C) (Oral)   Ht 5' 10.87" (1.8 m)   Wt 235 lb (106.6 kg)   SpO2 96%   BMI 32.90 kg/m    Subjective:    Patient ID: Cassandra Robbins, female    DOB: [birth year], 46 y.o.   MRN: [id number]  HPI: Cassandra Cowingithiel L Pankowski is a 46 y.o. female  Chief Complaint  Patient presents with  . Annual Exam   Doing okay with ADHD taken Adderall to a day mostly some days doesn't take depends on work schedule. When does take it is able to sleep. Does well otherwise. Flonase for allergies doing stable. Does have a URI now that's may be getting better.  Relevant past medical, surgical, family and social history reviewed and updated as indicated. Interim medical history since our last visit reviewed. Allergies and medications reviewed and updated.  Review of Systems  Constitutional: Negative.   HENT: Negative.   Eyes: Negative.   Respiratory: Negative.   Cardiovascular: Negative.   Gastrointestinal: Negative.   Endocrine: Negative.   Genitourinary: Negative.   Musculoskeletal: Negative.   Skin: Negative.   Allergic/Immunologic: Negative.   Neurological: Negative.   Hematological: Negative.   Psychiatric/Behavioral: Negative.     Per HPI unless specifically indicated above     Objective:    BP 118/80   Pulse 80   Temp 98.5 F (36.9 C) (Oral)   Ht 5' 10.87" (1.8 m)   Wt 235 lb (106.6 kg)   SpO2 96%   BMI 32.90 kg/m   Wt Readings from Last 3 Encounters:  03/18/17 235 lb (106.6 kg)  09/05/16 230 lb (104.3 kg)  03/08/16 226 lb 6.4 oz (102.7 kg)    Physical Exam  Constitutional: She is oriented to person, place, and time. She appears well-developed and well-nourished.  HENT:  Head: Normocephalic and atraumatic.  Right Ear: External ear normal.  Left Ear: External ear normal.  Nose: Nose normal.  Mouth/Throat: Oropharynx is clear and moist.  Eyes: Conjunctivae and EOM are normal. Pupils are equal, round, and reactive to light.  Neck: Normal  range of motion. Neck supple. Carotid bruit is not present.  Cardiovascular: Normal rate, regular rhythm and normal heart sounds.  No murmur heard. Pulmonary/Chest: Effort normal and breath sounds normal. She exhibits no mass. Right breast exhibits no mass, no skin change and no tenderness. Left breast exhibits no mass, no skin change and no tenderness. Breasts are symmetrical.  Abdominal: Soft. Bowel sounds are normal. There is no hepatosplenomegaly.  Musculoskeletal: Normal range of motion.  Neurological: She is alert and oriented to person, place, and time.  Skin: No rash noted.  Psychiatric: She has a normal mood and affect. Her behavior is normal. Judgment and thought content normal.    Results for orders placed or performed in visit on 03/08/16  HIV antibody  Result Value Ref Range   HIV Screen 4th Generation wRfx Non Reactive Non Reactive  CBC with Differential/Platelet  Result Value Ref Range   WBC 5.0 3.4 - 10.8 x10E3/uL   RBC 4.77 3.77 - 5.28 x10E6/uL   Hemoglobin 14.2 11.1 - 15.9 g/dL   Hematocrit 91.441.7 78.234.0 - 46.6 %   MCV 87 79 - 97 fL   MCH 29.8 26.6 - 33.0 pg   MCHC 34.1 31.5 - 35.7 g/dL   RDW 95.612.5 21.312.3 - 08.615.4 %   Platelets 374 150 - 379 x10E3/uL   Neutrophils 55 Not Estab. %  Lymphs 34 Not Estab. %   Monocytes 6 Not Estab. %   Eos 4 Not Estab. %   Basos 1 Not Estab. %   Neutrophils Absolute 2.8 1.4 - 7.0 x10E3/uL   Lymphocytes Absolute 1.7 0.7 - 3.1 x10E3/uL   Monocytes Absolute 0.3 0.1 - 0.9 x10E3/uL   EOS (ABSOLUTE) 0.2 0.0 - 0.4 x10E3/uL   Basophils Absolute 0.0 0.0 - 0.2 x10E3/uL   Immature Granulocytes 0 Not Estab. %   Immature Grans (Abs) 0.0 0.0 - 0.1 x10E3/uL  Comprehensive metabolic panel  Result Value Ref Range   Glucose 92 65 - 99 mg/dL   BUN 17 6 - 24 mg/dL   Creatinine, Ser 1.610.85 0.57 - 1.00 mg/dL   GFR calc non Af Amer 82 >59 mL/min/1.73   GFR calc Af Amer 95 >59 mL/min/1.73   BUN/Creatinine Ratio 20 9 - 23   Sodium 140 134 - 144 mmol/L    Potassium 4.7 3.5 - 5.2 mmol/L   Chloride 98 96 - 106 mmol/L   CO2 26 18 - 29 mmol/L   Calcium 9.6 8.7 - 10.2 mg/dL   Total Protein 7.3 6.0 - 8.5 g/dL   Albumin 4.3 3.5 - 5.5 g/dL   Globulin, Total 3.0 1.5 - 4.5 g/dL   Albumin/Globulin Ratio 1.4 1.2 - 2.2   Bilirubin Total 0.5 0.0 - 1.2 mg/dL   Alkaline Phosphatase 97 39 - 117 IU/L   AST 15 0 - 40 IU/L   ALT 16 0 - 32 IU/L  TSH  Result Value Ref Range   TSH 0.783 0.450 - 4.500 uIU/mL  Lipid panel  Result Value Ref Range   Cholesterol, Total 199 100 - 199 mg/dL   Triglycerides 66 0 - 149 mg/dL   HDL 63 >09>39 mg/dL   VLDL Cholesterol Cal 13 5 - 40 mg/dL   LDL Calculated 604123 (H) 0 - 99 mg/dL   Chol/HDL Ratio 3.2 0.0 - 4.4 ratio units  Urinalysis, Routine w reflex microscopic ([facility])  Result Value Ref Range   Specific Gravity, UA 1.015 1.005 - 1.030   pH, UA 6.5 5.0 - 7.5   Color, UA Yellow Yellow   Appearance Ur Clear Clear   Leukocytes, UA Negative Negative   Protein, UA Negative Negative/Trace   Glucose, UA Negative Negative   Ketones, UA Negative Negative   RBC, UA Negative Negative   Bilirubin, UA Negative Negative   Urobilinogen, Ur 0.2 0.2 - 1.0 mg/dL   Nitrite, UA Negative Negative      Assessment & Plan:   Problem List Items Addressed This Visit    None    Visit Diagnoses    Immunization due           Follow up plan: No Follow-up on file.

## 2017-03-19 ENCOUNTER — Encounter: Payer: Self-pay | Admitting: Family Medicine

## 2017-03-19 LAB — COMPREHENSIVE METABOLIC PANEL
ALK PHOS: 97 IU/L (ref 39–117)
ALT: 22 IU/L (ref 0–32)
AST: 31 IU/L (ref 0–40)
Albumin/Globulin Ratio: 1.4 (ref 1.2–2.2)
Albumin: 4.1 g/dL (ref 3.5–5.5)
BILIRUBIN TOTAL: 0.4 mg/dL (ref 0.0–1.2)
BUN/Creatinine Ratio: 25 — ABNORMAL HIGH (ref 9–23)
BUN: 15 mg/dL (ref 6–24)
CHLORIDE: 101 mmol/L (ref 96–106)
CO2: 24 mmol/L (ref 20–29)
Calcium: 9 mg/dL (ref 8.7–10.2)
Creatinine, Ser: 0.6 mg/dL (ref 0.57–1.00)
GFR calc Af Amer: 126 mL/min/{1.73_m2} (ref >59)
GFR calc non Af Amer: 109 mL/min/{1.73_m2} (ref >59)
GLUCOSE: 96 mg/dL (ref 65–99)
Globulin, Total: 3 g/dL (ref 1.5–4.5)
Potassium: 4.3 mmol/L (ref 3.5–5.2)
Sodium: 141 mmol/L (ref 134–144)
Total Protein: 7.1 g/dL (ref 6.0–8.5)

## 2017-03-19 LAB — LIPID PANEL
CHOLESTEROL TOTAL: 201 mg/dL — AB (ref 100–199)
Chol/HDL Ratio: 3.6 ratio (ref 0.0–4.4)
HDL: 56 mg/dL (ref >39)
LDL Calculated: 129 mg/dL — ABNORMAL HIGH (ref 0–99)
Triglycerides: 80 mg/dL (ref 0–149)
VLDL CHOLESTEROL CAL: 16 mg/dL (ref 5–40)

## 2017-03-19 LAB — CBC WITH DIFFERENTIAL/PLATELET
BASOS: 0 %
Basophils Absolute: 0 10*3/uL (ref 0.0–0.2)
EOS (ABSOLUTE): 0.3 10*3/uL (ref 0.0–0.4)
EOS: 3 %
HEMATOCRIT: 40.5 % (ref 34.0–46.6)
HEMOGLOBIN: 13.8 g/dL (ref 11.1–15.9)
IMMATURE GRANULOCYTES: 0 %
Immature Grans (Abs): 0 10*3/uL (ref 0.0–0.1)
LYMPHS ABS: 1.8 10*3/uL (ref 0.7–3.1)
Lymphs: 20 %
MCH: 30.3 pg (ref 26.6–33.0)
MCHC: 34.1 g/dL (ref 31.5–35.7)
MCV: 89 fL (ref 79–97)
MONOCYTES: 7 %
MONOS ABS: 0.6 10*3/uL (ref 0.1–0.9)
NEUTROS PCT: 70 %
Neutrophils Absolute: 6.3 10*3/uL (ref 1.4–7.0)
Platelets: 343 10*3/uL (ref 150–379)
RBC: 4.55 x10E6/uL (ref 3.77–5.28)
RDW: 13.1 % (ref 12.3–15.4)
WBC: 9 10*3/uL (ref 3.4–10.8)

## 2017-03-19 LAB — TSH: TSH: 0.68 u[IU]/mL (ref 0.450–4.500)

## 2017-06-20 ENCOUNTER — Encounter: Payer: Self-pay | Admitting: Family Medicine

## 2017-08-13 ENCOUNTER — Ambulatory Visit: Payer: BLUE CROSS/BLUE SHIELD | Admitting: Family Medicine

## 2017-08-13 ENCOUNTER — Ambulatory Visit
Admission: RE | Admit: 2017-08-13 | Discharge: 2017-08-13 | Disposition: A | Discharge status: Home / Self Care | Payer: BLUE CROSS/BLUE SHIELD | Source: Ambulatory Visit | Attending: Family Medicine | Admitting: Family Medicine

## 2017-08-13 ENCOUNTER — Telehealth: Payer: Self-pay | Admitting: Family Medicine

## 2017-08-13 ENCOUNTER — Encounter: Payer: Self-pay | Admitting: Family Medicine

## 2017-08-13 VITALS — BP 144/92 | HR 89 | Temp 98.5°F | Wt 232.4 lb

## 2017-08-13 DIAGNOSIS — M769 Unspecified enthesopathy, lower limb, excluding foot: Secondary | ICD-10-CM | POA: Diagnosis not present

## 2017-08-13 DIAGNOSIS — W228XXA Striking against or struck by other objects, initial encounter: Secondary | ICD-10-CM | POA: Insufficient documentation

## 2017-08-13 DIAGNOSIS — M25562 Pain in left knee: Secondary | ICD-10-CM | POA: Insufficient documentation

## 2017-08-13 DIAGNOSIS — S92514A Nondisplaced fracture of proximal phalanx of right lesser toe(s), initial encounter for closed fracture: Secondary | ICD-10-CM | POA: Diagnosis not present

## 2017-08-13 DIAGNOSIS — G8929 Other chronic pain: Secondary | ICD-10-CM | POA: Diagnosis not present

## 2017-08-13 DIAGNOSIS — M79674 Pain in right toe(s): Secondary | ICD-10-CM | POA: Diagnosis present

## 2017-08-13 NOTE — Telephone Encounter (Signed)
Please let her know that she did break her toe. It's not displaced and she didn't do anything to the metarsal. I can refer her to podiatry if she wants, but it doesn't look like she should need surgery- just keep it buddy taped and try to stay off of it as much as she can.   Her knee showed some very mild arthritis, but was otherwise normal.  Let me know what she wants to do.

## 2017-08-13 NOTE — Telephone Encounter (Signed)
Patient states that she does not want to do anything further.

## 2017-08-13 NOTE — Progress Notes (Signed)
BP (!) 144/92 (BP Location: Right Arm, Patient Position: Sitting, Cuff Size: Large)   Pulse 89   Temp 98.5 F (36.9 C)   Wt 232 lb 7 oz (105.4 kg)   SpO2 100%   BMI 32.54 kg/m    Subjective:    Patient ID: Cassandra Robbins, female    DOB: Apr 10, 1971, 47 y.o.   MRN: [id number]  HPI: Cassandra Robbins is a 47 y.o. female  Chief Complaint  Patient presents with  . Toe Pain    right 5th toe, patient hit it on a box Sunday morning, she states that it was out a 45 degree angle, she put it back into place. She would like an xray  . Knee Pain    left   TOE PAIN Duration: Since Sunday Involved toe: rightpinky toe  Mechanism of injury: trauma Onset: sudden Severity: severe  Quality: dull, aching and throbbing Frequency: constant Radiation: yes Aggravating factors: weight bearing, walking and running  Alleviating factors: rest and NSAIDs  Status: stable Treatments attempted: ice, rest and ibuprofen  Relief with NSAIDs?: mild Morning stiffness: no Redness: no  Bruising: yes Swelling: yes Paresthesias / decreased sensation: no Fevers: no   KNEE PAIN Duration: weeks- has been worse the past couple of days Involved knee: left Mechanism of injury: trauma Location:just the knee cap Onset: sudden Severity: moderate  Quality:  sharp Frequency: constant Radiation: no Aggravating factors: moving it    Alleviating factors: not moving it   Status: stable Treatments attempted: rest and ibuprofen  Relief with NSAIDs?:  mild Weakness with weight bearing or walking: no Sensation of giving way: yes Locking: no Popping: no Bruising: no Swelling: yes Redness: yes Paresthesias/decreased sensation: no Fevers: no   Relevant past medical, surgical, family and social history reviewed and updated as indicated. Interim medical history since our last visit reviewed. Allergies and medications reviewed and updated.  Review of Systems  Constitutional: Negative.   Respiratory:  Negative.   Cardiovascular: Negative.   Musculoskeletal: Positive for arthralgias, gait problem, joint swelling and myalgias. Negative for back pain, neck pain and neck stiffness.  Skin: Positive for color change. Negative for pallor, rash and wound.  Neurological: Negative for dizziness, tremors, seizures, syncope, facial asymmetry, speech difficulty, weakness, light-headedness, numbness and headaches.  Hematological: Negative for adenopathy. Does not bruise/bleed easily.  Psychiatric/Behavioral: Negative.     Per HPI unless specifically indicated above     Objective:    BP (!) 144/92 (BP Location: Right Arm, Patient Position: Sitting, Cuff Size: Large)   Pulse 89   Temp 98.5 F (36.9 C)   Wt 232 lb 7 oz (105.4 kg)   SpO2 100%   BMI 32.54 kg/m   Wt Readings from Last 3 Encounters:  08/13/17 232 lb 7 oz (105.4 kg)  03/18/17 235 lb (106.6 kg)  09/05/16 230 lb (104.3 kg)    Physical Exam  Constitutional: She is oriented to person, place, and time. She appears well-developed and well-nourished. No distress.  HENT:  Head: Normocephalic and atraumatic.  Right Ear: Hearing normal.  Left Ear: Hearing normal.  Nose: Nose normal.  Eyes: Conjunctivae and lids are normal. Right eye exhibits no discharge. Left eye exhibits no discharge. No scleral icterus.  Cardiovascular: Normal rate, regular rhythm, normal heart sounds and intact distal pulses. Exam reveals no gallop and no friction rub.  No murmur heard. Pulmonary/Chest: Effort normal and breath sounds normal. No stridor. No respiratory distress. She has no wheezes. She has no rales. She  exhibits no tenderness.  Musculoskeletal: Normal range of motion. She exhibits edema, tenderness and deformity.  Bruising and swelling and tenderness to R pinky toe, currently buddy taped to R 4th toe  Tenderness to palpation of the knee cap and patellar tendon. FROM, negative anterior and posterior drawer, negative mcmurrays, negative appley's  compression and distraction  Neurological: She is alert and oriented to person, place, and time.  Skin: Skin is warm and intact. Capillary refill takes less than 2 seconds. No rash noted. She is not diaphoretic. No erythema. No pallor.  Psychiatric: She has a normal mood and affect. Her speech is normal and behavior is normal. Judgment and thought content normal. Cognition and memory are normal.  Nursing note and vitals reviewed.   Results for orders placed or performed in visit on 03/18/17  Microscopic Examination  Result Value Ref Range   WBC, UA 0-5 0 - 5 /hpf   RBC, UA 0-2 0 - 2 /hpf   Epithelial Cells (non renal) 0-10 0 - 10 /hpf   Bacteria, UA None seen None seen/Few  Comprehensive metabolic panel  Result Value Ref Range   Glucose 96 65 - 99 mg/dL   BUN 15 6 - 24 mg/dL   Creatinine, Ser 0.45 0.57 - 1.00 mg/dL   GFR calc non Af Amer 109 >59 mL/min/1.73   GFR calc Af Amer 126 >59 mL/min/1.73   BUN/Creatinine Ratio 25 (H) 9 - 23   Sodium 141 134 - 144 mmol/L   Potassium 4.3 3.5 - 5.2 mmol/L   Chloride 101 96 - 106 mmol/L   CO2 24 20 - 29 mmol/L   Calcium 9.0 8.7 - 10.2 mg/dL   Total Protein 7.1 6.0 - 8.5 g/dL   Albumin 4.1 3.5 - 5.5 g/dL   Globulin, Total 3.0 1.5 - 4.5 g/dL   Albumin/Globulin Ratio 1.4 1.2 - 2.2   Bilirubin Total 0.4 0.0 - 1.2 mg/dL   Alkaline Phosphatase 97 39 - 117 IU/L   AST 31 0 - 40 IU/L   ALT 22 0 - 32 IU/L  Lipid panel  Result Value Ref Range   Cholesterol, Total 201 (H) 100 - 199 mg/dL   Triglycerides 80 0 - 149 mg/dL   HDL 56 >40 mg/dL   VLDL Cholesterol Cal 16 5 - 40 mg/dL   LDL Calculated 981 (H) 0 - 99 mg/dL   Chol/HDL Ratio 3.6 0.0 - 4.4 ratio  CBC with Differential/Platelet  Result Value Ref Range   WBC 9.0 3.4 - 10.8 x10E3/uL   RBC 4.55 3.77 - 5.28 x10E6/uL   Hemoglobin 13.8 11.1 - 15.9 g/dL   Hematocrit 19.1 47.8 - 46.6 %   MCV 89 79 - 97 fL   MCH 30.3 26.6 - 33.0 pg   MCHC 34.1 31.5 - 35.7 g/dL   RDW 29.5 62.1 - 30.8 %    Platelets 343 150 - 379 x10E3/uL   Neutrophils 70 Not Estab. %   Lymphs 20 Not Estab. %   Monocytes 7 Not Estab. %   Eos 3 Not Estab. %   Basos 0 Not Estab. %   Neutrophils Absolute 6.3 1.4 - 7.0 x10E3/uL   Lymphocytes Absolute 1.8 0.7 - 3.1 x10E3/uL   Monocytes Absolute 0.6 0.1 - 0.9 x10E3/uL   EOS (ABSOLUTE) 0.3 0.0 - 0.4 x10E3/uL   Basophils Absolute 0.0 0.0 - 0.2 x10E3/uL   Immature Granulocytes 0 Not Estab. %   Immature Grans (Abs) 0.0 0.0 - 0.1 x10E3/uL  TSH  Result Value  Ref Range   TSH 0.680 0.450 - 4.500 uIU/mL  Urinalysis, Routine w reflex microscopic  Result Value Ref Range   Specific Gravity, UA 1.010 1.005 - 1.030   pH, UA 6.5 5.0 - 7.5   Color, UA Yellow Yellow   Appearance Ur Hazy (A) Clear   Leukocytes, UA Trace (A) Negative   Protein, UA Negative Negative/Trace   Glucose, UA Negative Negative   Ketones, UA Negative Negative   RBC, UA Negative Negative   Bilirubin, UA Negative Negative   Urobilinogen, Ur 0.2 0.2 - 1.0 mg/dL   Nitrite, UA Negative Negative   Microscopic Examination See below:       Assessment & Plan:   Problem List Items Addressed This Visit    None    Visit Diagnoses    Pain in right toe(s)    -  Primary   Concern for fracture. Will obtain x-ray. Pain controlled with ibuprofen. Call with any concerns. Stay off of it as much as she can.    Relevant Orders   DG Foot Complete Right   Chronic pain of left knee       Will obtain x-ray. Stretches given. Continue ibuprofen. Call with any concerns or if not getting better.    Relevant Orders   DG Knee Complete 4 Views Left       Follow up plan: Return Pending results.

## 2017-09-11 ENCOUNTER — Ambulatory Visit
Admission: RE | Admit: 2017-09-11 | Discharge: 2017-09-11 | Disposition: A | Discharge status: Home / Self Care | Payer: BLUE CROSS/BLUE SHIELD | Source: Ambulatory Visit | Attending: Otolaryngology | Admitting: Otolaryngology

## 2017-09-11 DIAGNOSIS — E042 Nontoxic multinodular goiter: Secondary | ICD-10-CM | POA: Diagnosis not present

## 2017-09-11 DIAGNOSIS — E041 Nontoxic single thyroid nodule: Secondary | ICD-10-CM

## 2017-09-16 ENCOUNTER — Ambulatory Visit: Payer: BLUE CROSS/BLUE SHIELD | Admitting: Family Medicine

## 2017-10-08 ENCOUNTER — Encounter: Payer: Self-pay | Admitting: Family Medicine

## 2017-10-08 ENCOUNTER — Ambulatory Visit (INDEPENDENT_AMBULATORY_CARE_PROVIDER_SITE_OTHER): Payer: BLUE CROSS/BLUE SHIELD | Admitting: Family Medicine

## 2017-10-08 DIAGNOSIS — S92511A Displaced fracture of proximal phalanx of right lesser toe(s), initial encounter for closed fracture: Secondary | ICD-10-CM

## 2017-10-08 DIAGNOSIS — F909 Attention-deficit hyperactivity disorder, unspecified type: Secondary | ICD-10-CM | POA: Diagnosis not present

## 2017-10-08 DIAGNOSIS — S92911A Unspecified fracture of right toe(s), initial encounter for closed fracture: Secondary | ICD-10-CM

## 2017-10-08 HISTORY — DX: Unspecified fracture of right toe(s), initial encounter for closed fracture: S92.911A

## 2017-10-08 MED ORDER — AMPHETAMINE-DEXTROAMPHETAMINE 20 MG PO TABS: 20.0000 mg | ORAL_TABLET | Freq: Two times a day (BID) | ORAL | 0 refills | Status: DC

## 2017-10-08 NOTE — Assessment & Plan Note (Signed)
With continued problems in her foot will refer to podiatry to further evaluate.

## 2017-10-08 NOTE — Assessment & Plan Note (Signed)
The current medical regimen is effective;  continue present plan and medications.  

## 2017-10-08 NOTE — Progress Notes (Signed)
BP 118/76   Pulse 87   Wt 231 lb (104.8 kg)   SpO2 98%   BMI 32.34 kg/m    Subjective:    Patient ID: Cassandra Robbins, female    DOB: [birth year], 47 y.o.   MRN: [id number]  HPI: Cassandra Robbins is a 47 y.o. female  Chief Complaint  Patient presents with  . Follow-up   Patient 2 months ago broke her right little toe.  Patient self reduced from sticking out at 90 degrees to straight.  Now toe is still not doing right and significant discomfort and swelling. Also taking Adderall without problems.  Takes generally a half a tablet in the mornings.  So prescription last longer than expected.  Patient having good response and able to be productive at work and not have problems with sleep. Does have some teeth clenching side effects but otherwise does okay. Relevant past medical, surgical, family and social history reviewed and updated as indicated. Interim medical history since our last visit reviewed. Allergies and medications reviewed and updated.  Review of Systems  Constitutional: Negative.   Respiratory: Negative.   Cardiovascular: Negative.     Per HPI unless specifically indicated above     Objective:    BP 118/76   Pulse 87   Wt 231 lb (104.8 kg)   SpO2 98%   BMI 32.34 kg/m   Wt Readings from Last 3 Encounters:  10/08/17 231 lb (104.8 kg)  08/13/17 232 lb 7 oz (105.4 kg)  03/18/17 235 lb (106.6 kg)    Physical Exam  Constitutional: She is oriented to person, place, and time. She appears well-developed and well-nourished.  HENT:  Head: Normocephalic and atraumatic.  Eyes: Conjunctivae and EOM are normal.  Neck: Normal range of motion.  Cardiovascular: Normal rate, regular rhythm and normal heart sounds.  Pulmonary/Chest: Effort normal and breath sounds normal.  Musculoskeletal: Normal range of motion.  Neurological: She is alert and oriented to person, place, and time.  Skin: No erythema.  Psychiatric: She has a normal mood and affect. Her behavior is  normal. Judgment and thought content normal.    Results for orders placed or performed in visit on 03/18/17  Microscopic Examination  Result Value Ref Range   WBC, UA 0-5 0 - 5 /hpf   RBC, UA 0-2 0 - 2 /hpf   Epithelial Cells (non renal) 0-10 0 - 10 /hpf   Bacteria, UA None seen None seen/Few  Comprehensive metabolic panel  Result Value Ref Range   Glucose 96 65 - 99 mg/dL   BUN 15 6 - 24 mg/dL   Creatinine, Ser 4.09 0.57 - 1.00 mg/dL   GFR calc non Af Amer 109 >59 mL/min/1.73   GFR calc Af Amer 126 >59 mL/min/1.73   BUN/Creatinine Ratio 25 (H) 9 - 23   Sodium 141 134 - 144 mmol/L   Potassium 4.3 3.5 - 5.2 mmol/L   Chloride 101 96 - 106 mmol/L   CO2 24 20 - 29 mmol/L   Calcium 9.0 8.7 - 10.2 mg/dL   Total Protein 7.1 6.0 - 8.5 g/dL   Albumin 4.1 3.5 - 5.5 g/dL   Globulin, Total 3.0 1.5 - 4.5 g/dL   Albumin/Globulin Ratio 1.4 1.2 - 2.2   Bilirubin Total 0.4 0.0 - 1.2 mg/dL   Alkaline Phosphatase 97 39 - 117 IU/L   AST 31 0 - 40 IU/L   ALT 22 0 - 32 IU/L  Lipid panel  Result Value Ref Range  Cholesterol, Total 201 (H) 100 - 199 mg/dL   Triglycerides 80 0 - 149 mg/dL   HDL 56 >16>39 mg/dL   VLDL Cholesterol Cal 16 5 - 40 mg/dL   LDL Calculated 109129 (H) 0 - 99 mg/dL   Chol/HDL Ratio 3.6 0.0 - 4.4 ratio  CBC with Differential/Platelet  Result Value Ref Range   WBC 9.0 3.4 - 10.8 x10E3/uL   RBC 4.55 3.77 - 5.28 x10E6/uL   Hemoglobin 13.8 11.1 - 15.9 g/dL   Hematocrit 60.440.5 54.034.0 - 46.6 %   MCV 89 79 - 97 fL   MCH 30.3 26.6 - 33.0 pg   MCHC 34.1 31.5 - 35.7 g/dL   RDW 98.113.1 19.112.3 - 47.815.4 %   Platelets 343 150 - 379 x10E3/uL   Neutrophils 70 Not Estab. %   Lymphs 20 Not Estab. %   Monocytes 7 Not Estab. %   Eos 3 Not Estab. %   Basos 0 Not Estab. %   Neutrophils Absolute 6.3 1.4 - 7.0 x10E3/uL   Lymphocytes Absolute 1.8 0.7 - 3.1 x10E3/uL   Monocytes Absolute 0.6 0.1 - 0.9 x10E3/uL   EOS (ABSOLUTE) 0.3 0.0 - 0.4 x10E3/uL   Basophils Absolute 0.0 0.0 - 0.2 x10E3/uL    Immature Granulocytes 0 Not Estab. %   Immature Grans (Abs) 0.0 0.0 - 0.1 x10E3/uL  TSH  Result Value Ref Range   TSH 0.680 0.450 - 4.500 uIU/mL  Urinalysis, Routine w reflex microscopic  Result Value Ref Range   Specific Gravity, UA 1.010 1.005 - 1.030   pH, UA 6.5 5.0 - 7.5   Color, UA Yellow Yellow   Appearance Ur Hazy (A) Clear   Leukocytes, UA Trace (A) Negative   Protein, UA Negative Negative/Trace   Glucose, UA Negative Negative   Ketones, UA Negative Negative   RBC, UA Negative Negative   Bilirubin, UA Negative Negative   Urobilinogen, Ur 0.2 0.2 - 1.0 mg/dL   Nitrite, UA Negative Negative   Microscopic Examination See below:       Assessment & Plan:   Problem List Items Addressed This Visit      Musculoskeletal and Integument   Toe fracture, right    With continued problems in her foot will refer to podiatry to further evaluate.      Relevant Orders   Ambulatory referral to Podiatry     Other   Adult ADHD    The current medical regimen is effective;  continue present plan and medications.           Follow up plan: Return in about 6 months (around 04/09/2018) for Physical Exam.

## 2017-11-13 ENCOUNTER — Encounter: Payer: Self-pay | Admitting: Podiatry

## 2017-11-13 ENCOUNTER — Ambulatory Visit (INDEPENDENT_AMBULATORY_CARE_PROVIDER_SITE_OTHER): Payer: BLUE CROSS/BLUE SHIELD

## 2017-11-13 ENCOUNTER — Ambulatory Visit: Payer: BLUE CROSS/BLUE SHIELD | Admitting: Podiatry

## 2017-11-13 DIAGNOSIS — S90121A Contusion of right lesser toe(s) without damage to nail, initial encounter: Secondary | ICD-10-CM

## 2017-11-13 DIAGNOSIS — S92504A Nondisplaced unspecified fracture of right lesser toe(s), initial encounter for closed fracture: Secondary | ICD-10-CM

## 2017-11-13 NOTE — Progress Notes (Signed)
  Subjective:  Patient ID: Cassandra Robbins, female    DOB: 02-12-1971,  MRN: [id number] HPI Chief Complaint  Patient presents with  . Toe Pain    Patient presents today for right 5th toe pain x 3 months.  She reports back in April she hit her 5th toe on a box and broke it.  She had xray which showed nondisplaced fracture.  She continues to have pain and swelling in her toe at the joint.  She has been taking Ibuprofen and buddy taping her 4th, 5th toes    47 y.o. female presents with the above complaint.   ROS: Denies fever chills nausea vomiting muscle aches pains calf pain back pain chest pain shortness of breath and headache.  Past Medical History:  Diagnosis Date  . Adult ADHD   . Conductive hearing loss in right ear   . Depression   . 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 LAPAROSCOPIC ENDOMETRIOSIS FULGURATION  2007  . SEPTOPLASTY  2005  . TUBAL LIGATION      Current Outpatient Medications:  .  amphetamine-dextroamphetamine (ADDERALL) 20 MG tablet, Take 1 tablet (20 mg total) by mouth 2 (two) times daily., Disp: 180 tablet, Rfl: 0 .  fluticasone (FLONASE) 50 MCG/ACT nasal spray, Place 2 sprays daily into both nostrils., Disp: 16 g, Rfl: 11  Allergies  Allergen Reactions  . Penicillins Anaphylaxis  . Amoxicillin Rash   Review of Systems Objective:  There were no vitals filed for this visit.  General: Well developed, nourished, in no acute distress, alert and oriented x3   Dermatological: Skin is warm, dry and supple bilateral. Nails x 10 are well maintained; remaining integument appears unremarkable at this time. There are no open sores, no preulcerative lesions, no rash or signs of infection present.  Vascular: Dorsalis Pedis artery and Posterior Tibial artery pedal pulses are 2/4 bilateral with immedate capillary fill time. Pedal hair growth present. No varicosities and no lower extremity edema present bilateral.   Neruologic:  Grossly intact via light touch bilateral. Vibratory intact via tuning fork bilateral. Protective threshold with Semmes Wienstein monofilament intact to all pedal sites bilateral. Patellar and Achilles deep tendon reflexes 2+ bilateral. No Babinski or clonus noted bilateral.   Musculoskeletal: No gross boney pedal deformities bilateral. No pain, crepitus, or limitation noted with foot and ankle range of motion bilateral. Muscular strength 5/5 in all groups tested bilateral.  Mildly tender fifth toe right foot on palpation of the proximal phalanx.  Appears to be no instability of the toe at this point.  Gait: Unassisted, Nonantalgic.    Radiographs:  Radiographs taken today demonstrate a fracture proximal phalanx fifth digit right foot appears to be healing minimally displaced.  Assessment & Plan:   Assessment: Fracture proximal phalanx fifth digit right foot minimal increase in deformity.  Plan: Instructed her not to buddy tape it to the fourth toe but to tape it individually and demonstrated this to her explained to her this will take several months for this to feel much better she understands that and is amenable to it.      T. [place], [place]

## 2018-04-15 ENCOUNTER — Ambulatory Visit (INDEPENDENT_AMBULATORY_CARE_PROVIDER_SITE_OTHER): Payer: BLUE CROSS/BLUE SHIELD | Admitting: Family Medicine

## 2018-04-15 ENCOUNTER — Encounter: Payer: Self-pay | Admitting: Family Medicine

## 2018-04-15 VITALS — BP 130/90 | HR 86 | Temp 98.4°F | Ht 69.3 in | Wt 232.0 lb

## 2018-04-15 DIAGNOSIS — Z0000 Encounter for general adult medical examination without abnormal findings: Secondary | ICD-10-CM

## 2018-04-15 DIAGNOSIS — Z23 Encounter for immunization: Secondary | ICD-10-CM | POA: Diagnosis not present

## 2018-04-15 DIAGNOSIS — F3341 Major depressive disorder, recurrent, in partial remission: Secondary | ICD-10-CM

## 2018-04-15 DIAGNOSIS — F909 Attention-deficit hyperactivity disorder, unspecified type: Secondary | ICD-10-CM

## 2018-04-15 LAB — URINALYSIS, ROUTINE W REFLEX MICROSCOPIC
BILIRUBIN UA: NEGATIVE
Glucose, UA: NEGATIVE
Ketones, UA: NEGATIVE
NITRITE UA: NEGATIVE
PH UA: 7.5 (ref 5.0–7.5)
Protein, UA: NEGATIVE
RBC UA: NEGATIVE
Specific Gravity, UA: 1.015 (ref 1.005–1.030)
UUROB: 0.2 mg/dL (ref 0.2–1.0)

## 2018-04-15 LAB — MICROSCOPIC EXAMINATION: RBC MICROSCOPIC, UA: NONE SEEN /HPF (ref 0–2)

## 2018-04-15 MED ORDER — AMPHETAMINE-DEXTROAMPHETAMINE 20 MG PO TABS: 20.0000 mg | ORAL_TABLET | Freq: Two times a day (BID) | ORAL | 0 refills | Status: DC

## 2018-04-15 MED ORDER — FLUTICASONE PROPIONATE 50 MCG/ACT NA SUSP: 2.0000 | Freq: Every day | NASAL | 11 refills | Status: DC

## 2018-04-15 NOTE — Patient Instructions (Signed)

## 2018-04-15 NOTE — Assessment & Plan Note (Signed)
The current medical regimen is effective;  continue present plan and medications.  

## 2018-04-15 NOTE — Assessment & Plan Note (Signed)
Stable no meds for now

## 2018-04-15 NOTE — Progress Notes (Signed)
BP 130/90   Pulse 86   Temp 98.4 F (36.9 C) (Oral)   Ht 5' 9.3" (1.76 m)   Wt 232 lb (105.2 kg)   LMP 03/16/2018 (Approximate)   SpO2 99%   BMI 33.96 kg/m    Subjective:    Patient ID: Cassandra Robbins, female    DOB: 02-06-1971, 47 y.o.   MRN: [id number]  HPI: Cassandra Robbins is a 47 y.o. female  Chief Complaint  Patient presents with  . Annual Exam   Patient all in all doing well personally but having a great deal of stress taking care of her daughter who has a presumptive diagnosis of P OTS. Patient taking care of her daughter who continues to have syncopal spells when she stands up and so is bedridden.  She is only 47 years old with limited resources. Patient responds to her stress with eating and sleeping and consequently sleeps about 12 hours a night does limited exercise and has gained weight.  Goes to GYN for pelvic and breast exam.   Relevant past medical, surgical, family and social history reviewed and updated as indicated. Interim medical history since our last visit reviewed. Allergies and medications reviewed and updated.  Review of Systems  Constitutional: Negative.   HENT: Negative.   Eyes: Negative.   Respiratory: Negative.   Cardiovascular: Negative.   Gastrointestinal: Negative.   Endocrine: Negative.   Genitourinary: Negative.   Musculoskeletal: Negative.   Skin: Negative.   Allergic/Immunologic: Negative.   Neurological: Negative.   Hematological: Negative.   Psychiatric/Behavioral: Negative.     Per HPI unless specifically indicated above     Objective:    BP 130/90   Pulse 86   Temp 98.4 F (36.9 C) (Oral)   Ht 5' 9.3" (1.76 m)   Wt 232 lb (105.2 kg)   LMP 03/16/2018 (Approximate)   SpO2 99%   BMI 33.96 kg/m   Wt Readings from Last 3 Encounters:  04/15/18 232 lb (105.2 kg)  10/08/17 231 lb (104.8 kg)  08/13/17 232 lb 7 oz (105.4 kg)    Physical Exam Constitutional:      Appearance: She is well-developed.  HENT:   Head: Normocephalic and atraumatic.     Right Ear: External ear normal.     Left Ear: External ear normal.     Nose: Nose normal.  Eyes:     Conjunctiva/sclera: Conjunctivae normal.     Pupils: Pupils are equal, round, and reactive to light.  Neck:     Musculoskeletal: Normal range of motion and neck supple.     Vascular: No carotid bruit.  Cardiovascular:     Rate and Rhythm: Normal rate and regular rhythm.     Heart sounds: Normal heart sounds. No murmur.  Pulmonary:     Effort: Pulmonary effort is normal.     Breath sounds: Normal breath sounds.  Abdominal:     General: Bowel sounds are normal.     Palpations: Abdomen is soft.  Musculoskeletal: Normal range of motion.  Skin:    Findings: No rash.  Neurological:     Mental Status: She is alert and oriented to person, place, and time.  Psychiatric:        Behavior: Behavior normal.        Thought Content: Thought content normal.        Judgment: Judgment normal.     Results for orders placed or performed in visit on 03/18/17  Microscopic Examination  Result Value Ref Range  WBC, UA 0-5 0 - 5 /hpf   RBC, UA 0-2 0 - 2 /hpf   Epithelial Cells (non renal) 0-10 0 - 10 /hpf   Bacteria, UA None seen None seen/Few  Comprehensive metabolic panel  Result Value Ref Range   Glucose 96 65 - 99 mg/dL   BUN 15 6 - 24 mg/dL   Creatinine, Ser 5.78 0.57 - 1.00 mg/dL   GFR calc non Af Amer 109 >59 mL/min/1.73   GFR calc Af Amer 126 >59 mL/min/1.73   BUN/Creatinine Ratio 25 (H) 9 - 23   Sodium 141 134 - 144 mmol/L   Potassium 4.3 3.5 - 5.2 mmol/L   Chloride 101 96 - 106 mmol/L   CO2 24 20 - 29 mmol/L   Calcium 9.0 8.7 - 10.2 mg/dL   Total Protein 7.1 6.0 - 8.5 g/dL   Albumin 4.1 3.5 - 5.5 g/dL   Globulin, Total 3.0 1.5 - 4.5 g/dL   Albumin/Globulin Ratio 1.4 1.2 - 2.2   Bilirubin Total 0.4 0.0 - 1.2 mg/dL   Alkaline Phosphatase 97 39 - 117 IU/L   AST 31 0 - 40 IU/L   ALT 22 0 - 32 IU/L  Lipid panel  Result Value Ref Range    Cholesterol, Total 201 (H) 100 - 199 mg/dL   Triglycerides 80 0 - 149 mg/dL   HDL 56 >46 mg/dL   VLDL Cholesterol Cal 16 5 - 40 mg/dL   LDL Calculated 962 (H) 0 - 99 mg/dL   Chol/HDL Ratio 3.6 0.0 - 4.4 ratio  CBC with Differential/Platelet  Result Value Ref Range   WBC 9.0 3.4 - 10.8 x10E3/uL   RBC 4.55 3.77 - 5.28 x10E6/uL   Hemoglobin 13.8 11.1 - 15.9 g/dL   Hematocrit 95.2 84.1 - 46.6 %   MCV 89 79 - 97 fL   MCH 30.3 26.6 - 33.0 pg   MCHC 34.1 31.5 - 35.7 g/dL   RDW 32.4 40.1 - 02.7 %   Platelets 343 150 - 379 x10E3/uL   Neutrophils 70 Not Estab. %   Lymphs 20 Not Estab. %   Monocytes 7 Not Estab. %   Eos 3 Not Estab. %   Basos 0 Not Estab. %   Neutrophils Absolute 6.3 1.4 - 7.0 x10E3/uL   Lymphocytes Absolute 1.8 0.7 - 3.1 x10E3/uL   Monocytes Absolute 0.6 0.1 - 0.9 x10E3/uL   EOS (ABSOLUTE) 0.3 0.0 - 0.4 x10E3/uL   Basophils Absolute 0.0 0.0 - 0.2 x10E3/uL   Immature Granulocytes 0 Not Estab. %   Immature Grans (Abs) 0.0 0.0 - 0.1 x10E3/uL  TSH  Result Value Ref Range   TSH 0.680 0.450 - 4.500 uIU/mL  Urinalysis, Routine w reflex microscopic  Result Value Ref Range   Specific Gravity, UA 1.010 1.005 - 1.030   pH, UA 6.5 5.0 - 7.5   Color, UA Yellow Yellow   Appearance Ur Hazy (A) Clear   Leukocytes, UA Trace (A) Negative   Protein, UA Negative Negative/Trace   Glucose, UA Negative Negative   Ketones, UA Negative Negative   RBC, UA Negative Negative   Bilirubin, UA Negative Negative   Urobilinogen, Ur 0.2 0.2 - 1.0 mg/dL   Nitrite, UA Negative Negative   Microscopic Examination See below:       Assessment & Plan:   Problem List Items Addressed This Visit      Other   Adult ADHD    The current medical regimen is effective;  continue present plan  and medications.       Depression    Stable no meds for now       Other Visit Diagnoses    Need for influenza vaccination    -  Primary   Relevant Orders   Flu Vaccine QUAD 36+ mos IM (Completed)    Routine general medical examination at a health care facility       Relevant Orders   CBC with Differential/Platelet   Comprehensive metabolic panel   TSH   Lipid panel   Urinalysis, Routine w reflex microscopic   Immunization due       Relevant Medications   fluticasone (FLONASE) 50 MCG/ACT nasal spray       Follow up plan: Return in about 6 months (around 10/15/2018) for ADHD.

## 2018-04-16 ENCOUNTER — Encounter: Payer: Self-pay | Admitting: Family Medicine

## 2018-04-16 LAB — CBC WITH DIFFERENTIAL/PLATELET
BASOS: 1 %
Basophils Absolute: 0 10*3/uL (ref 0.0–0.2)
EOS (ABSOLUTE): 0.3 10*3/uL (ref 0.0–0.4)
Eos: 4 %
HEMOGLOBIN: 14 g/dL (ref 11.1–15.9)
Hematocrit: 42.7 % (ref 34.0–46.6)
Immature Grans (Abs): 0 10*3/uL (ref 0.0–0.1)
Immature Granulocytes: 0 %
LYMPHS ABS: 2 10*3/uL (ref 0.7–3.1)
Lymphs: 32 %
MCH: 28.9 pg (ref 26.6–33.0)
MCHC: 32.8 g/dL (ref 31.5–35.7)
MCV: 88 fL (ref 79–97)
MONOS ABS: 0.5 10*3/uL (ref 0.1–0.9)
Monocytes: 7 %
NEUTROS ABS: 3.5 10*3/uL (ref 1.4–7.0)
Neutrophils: 56 %
Platelets: 337 10*3/uL (ref 150–450)
RBC: 4.85 x10E6/uL (ref 3.77–5.28)
RDW: 12.3 % (ref 12.3–15.4)
WBC: 6.3 10*3/uL (ref 3.4–10.8)

## 2018-04-16 LAB — COMPREHENSIVE METABOLIC PANEL
A/G RATIO: 1.7 (ref 1.2–2.2)
ALBUMIN: 4.4 g/dL (ref 3.5–5.5)
ALK PHOS: 100 IU/L (ref 39–117)
ALT: 17 IU/L (ref 0–32)
AST: 16 IU/L (ref 0–40)
BUN / CREAT RATIO: 18 (ref 9–23)
BUN: 12 mg/dL (ref 6–24)
Bilirubin Total: 0.3 mg/dL (ref 0.0–1.2)
CO2: 23 mmol/L (ref 20–29)
Calcium: 9.2 mg/dL (ref 8.7–10.2)
Chloride: 102 mmol/L (ref 96–106)
Creatinine, Ser: 0.68 mg/dL (ref 0.57–1.00)
GFR calc Af Amer: 120 mL/min/{1.73_m2} (ref >59)
GFR, EST NON AFRICAN AMERICAN: 104 mL/min/{1.73_m2} (ref >59)
Globulin, Total: 2.6 g/dL (ref 1.5–4.5)
Glucose: 96 mg/dL (ref 65–99)
POTASSIUM: 4.6 mmol/L (ref 3.5–5.2)
Sodium: 139 mmol/L (ref 134–144)
Total Protein: 7 g/dL (ref 6.0–8.5)

## 2018-04-16 LAB — LIPID PANEL
CHOLESTEROL TOTAL: 228 mg/dL — AB (ref 100–199)
Chol/HDL Ratio: 3.9 ratio (ref 0.0–4.4)
HDL: 58 mg/dL (ref >39)
LDL CALC: 145 mg/dL — AB (ref 0–99)
Triglycerides: 127 mg/dL (ref 0–149)
VLDL Cholesterol Cal: 25 mg/dL (ref 5–40)

## 2018-04-16 LAB — TSH: TSH: 0.771 u[IU]/mL (ref 0.450–4.500)

## 2018-10-16 ENCOUNTER — Encounter: Payer: Self-pay | Admitting: Family Medicine

## 2018-10-16 ENCOUNTER — Other Ambulatory Visit: Payer: Self-pay

## 2018-10-16 ENCOUNTER — Ambulatory Visit (INDEPENDENT_AMBULATORY_CARE_PROVIDER_SITE_OTHER): Payer: Self-pay | Admitting: Family Medicine

## 2018-10-16 DIAGNOSIS — F909 Attention-deficit hyperactivity disorder, unspecified type: Secondary | ICD-10-CM

## 2018-10-16 DIAGNOSIS — F331 Major depressive disorder, recurrent, moderate: Secondary | ICD-10-CM | POA: Insufficient documentation

## 2018-10-16 DIAGNOSIS — F3341 Major depressive disorder, recurrent, in partial remission: Secondary | ICD-10-CM

## 2018-10-16 MED ORDER — AMPHETAMINE-DEXTROAMPHETAMINE 20 MG PO TABS: 20.0000 mg | ORAL_TABLET | Freq: Two times a day (BID) | ORAL | 0 refills | Status: DC

## 2018-10-16 NOTE — Assessment & Plan Note (Signed)
The current medical regimen is effective;  continue present plan and medications.  

## 2018-10-16 NOTE — Progress Notes (Signed)
There were no vitals taken for this visit.   Subjective:    Patient ID: Cassandra Robbins, female    DOB: 08/01/1970, 48 y.o.   MRN: [id number]  HPI: Cassandra Robbins is a 48 y.o. female  Med check  Discussed with patient doing well rare use of ADHD medication but sometimes still needs it.  Biggest challenges taking care of her daughter with newly diagnosed pots.   Relevant past medical, surgical, family and social history reviewed and updated as indicated. Interim medical history since our last visit reviewed. Allergies and medications reviewed and updated.  Review of Systems  Constitutional: Negative.   Respiratory: Negative.   Cardiovascular: Negative.     Per HPI unless specifically indicated above     Objective:    There were no vitals taken for this visit.  Wt Readings from Last 3 Encounters:  04/15/18 232 lb (105.2 kg)  10/08/17 231 lb (104.8 kg)  08/13/17 232 lb 7 oz (105.4 kg)    Physical Exam  Results for orders placed or performed in visit on 04/15/18  Microscopic Examination   URINE  Result Value Ref Range   WBC, UA 0-5 0 - 5 /hpf   RBC, UA None seen 0 - 2 /hpf   Epithelial Cells (non renal) 0-10 0 - 10 /hpf   Renal Epithel, UA 0-10 (A) None seen /hpf   Bacteria, UA Few None seen/Few  CBC with Differential/Platelet  Result Value Ref Range   WBC 6.3 3.4 - 10.8 x10E3/uL   RBC 4.85 3.77 - 5.28 x10E6/uL   Hemoglobin 14.0 11.1 - 15.9 g/dL   Hematocrit 42.7 34.0 - 46.6 %   MCV 88 79 - 97 fL   MCH 28.9 26.6 - 33.0 pg   MCHC 32.8 31.5 - 35.7 g/dL   RDW 12.3 12.3 - 15.4 %   Platelets 337 150 - 450 x10E3/uL   Neutrophils 56 Not Estab. %   Lymphs 32 Not Estab. %   Monocytes 7 Not Estab. %   Eos 4 Not Estab. %   Basos 1 Not Estab. %   Neutrophils Absolute 3.5 1.4 - 7.0 x10E3/uL   Lymphocytes Absolute 2.0 0.7 - 3.1 x10E3/uL   Monocytes Absolute 0.5 0.1 - 0.9 x10E3/uL   EOS (ABSOLUTE) 0.3 0.0 - 0.4 x10E3/uL   Basophils Absolute 0.0 0.0 - 0.2 x10E3/uL   Immature Granulocytes 0 Not Estab. %   Immature Grans (Abs) 0.0 0.0 - 0.1 x10E3/uL  Comprehensive metabolic panel  Result Value Ref Range   Glucose 96 65 - 99 mg/dL   BUN 12 6 - 24 mg/dL   Creatinine, Ser 0.68 0.57 - 1.00 mg/dL   GFR calc non Af Amer 104 >59 mL/min/1.73   GFR calc Af Amer 120 >59 mL/min/1.73   BUN/Creatinine Ratio 18 9 - 23   Sodium 139 134 - 144 mmol/L   Potassium 4.6 3.5 - 5.2 mmol/L   Chloride 102 96 - 106 mmol/L   CO2 23 20 - 29 mmol/L   Calcium 9.2 8.7 - 10.2 mg/dL   Total Protein 7.0 6.0 - 8.5 g/dL   Albumin 4.4 3.5 - 5.5 g/dL   Globulin, Total 2.6 1.5 - 4.5 g/dL   Albumin/Globulin Ratio 1.7 1.2 - 2.2   Bilirubin Total 0.3 0.0 - 1.2 mg/dL   Alkaline Phosphatase 100 39 - 117 IU/L   AST 16 0 - 40 IU/L   ALT 17 0 - 32 IU/L  TSH  Result Value Ref Range  TSH 0.771 0.450 - 4.500 uIU/mL  Lipid panel  Result Value Ref Range   Cholesterol, Total 228 (H) 100 - 199 mg/dL   Triglycerides 960127 0 - 149 mg/dL   HDL 58 >45>39 mg/dL   VLDL Cholesterol Cal 25 5 - 40 mg/dL   LDL Calculated 409145 (H) 0 - 99 mg/dL   Chol/HDL Ratio 3.9 0.0 - 4.4 ratio  Urinalysis, Routine w reflex microscopic  Result Value Ref Range   Specific Gravity, UA 1.015 1.005 - 1.030   pH, UA 7.5 5.0 - 7.5   Color, UA Yellow Yellow   Appearance Ur Clear Clear   Leukocytes, UA 1+ (A) Negative   Protein, UA Negative Negative/Trace   Glucose, UA Negative Negative   Ketones, UA Negative Negative   RBC, UA Negative Negative   Bilirubin, UA Negative Negative   Urobilinogen, Ur 0.2 0.2 - 1.0 mg/dL   Nitrite, UA Negative Negative   Microscopic Examination See below:       Assessment & Plan:   Problem List Items Addressed This Visit      Other   Adult ADHD    The current medical regimen is effective;  continue present plan and medications.       Recurrent major depressive disorder, in partial remission (HCC)    The current medical regimen is effective;  continue present plan and medications.            Telemedicine using audio/video telecommunications for a synchronous communication visit. Today's visit due to COVID-19 isolation precautions I connected with and verified that I am speaking with the correct person using two identifiers.   I discussed the limitations, risks, security and privacy concerns of performing an evaluation and management service by telecommunication and the availability of in person appointments. I also discussed with the patient that there may be a patient responsible charge related to this service. The patient expressed understanding and agreed to proceed. The patient's location is home. I am at home.   I discussed the assessment and treatment plan with the patient. The patient was provided an opportunity to ask questions and all were answered. The patient agreed with the plan and demonstrated an understanding of the instructions.   The patient was advised to call back or seek an in-person evaluation if the symptoms worsen or if the condition fails to improve as anticipated.   I provided 21+ minutes of time during this encounter. Follow up plan: Return in about 6 months (around 04/17/2019).

## 2019-02-24 ENCOUNTER — Telehealth: Payer: Self-pay | Admitting: Internal Medicine

## 2019-02-24 NOTE — Telephone Encounter (Signed)
Unable to contact patient, voice box full ,to set up new patient appointment with Dr. Derrel Nip.

## 2019-03-09 ENCOUNTER — Encounter: Payer: Self-pay | Admitting: Family Medicine

## 2019-04-16 ENCOUNTER — Ambulatory Visit: Payer: BLUE CROSS/BLUE SHIELD | Admitting: Internal Medicine

## 2019-05-05 ENCOUNTER — Ambulatory Visit: Payer: Self-pay | Admitting: Family Medicine

## 2019-05-08 ENCOUNTER — Encounter: Payer: Self-pay | Admitting: Internal Medicine

## 2019-05-08 ENCOUNTER — Ambulatory Visit (INDEPENDENT_AMBULATORY_CARE_PROVIDER_SITE_OTHER): Payer: Self-pay | Admitting: Internal Medicine

## 2019-05-08 ENCOUNTER — Other Ambulatory Visit: Payer: Self-pay

## 2019-05-08 VITALS — Ht 69.0 in | Wt 232.0 lb

## 2019-05-08 DIAGNOSIS — F3341 Major depressive disorder, recurrent, in partial remission: Secondary | ICD-10-CM

## 2019-05-08 DIAGNOSIS — F909 Attention-deficit hyperactivity disorder, unspecified type: Secondary | ICD-10-CM

## 2019-05-08 DIAGNOSIS — F411 Generalized anxiety disorder: Secondary | ICD-10-CM

## 2019-05-08 DIAGNOSIS — R635 Abnormal weight gain: Secondary | ICD-10-CM

## 2019-05-08 DIAGNOSIS — Z1231 Encounter for screening mammogram for malignant neoplasm of breast: Secondary | ICD-10-CM

## 2019-05-08 MED ORDER — BUPROPION HCL ER (XL) 150 MG PO TB24: 150.0000 mg | ORAL_TABLET | Freq: Every day | ORAL | 2 refills | Status: DC

## 2019-05-08 NOTE — Progress Notes (Signed)
Virtual Visit via [email or web address]  This visit type was conducted due to national recommendations for restrictions regarding the COVID-19 pandemic (e.g. social distancing).  This format is felt to be most appropriate for this patient at this time.  All issues noted in this document were discussed and addressed.  No physical exam was performed (except for noted visual exam findings with Video Visits).   I connected with@ on 05/08/19 at  3:30 PM EST by a video enabled telemedicine application  and verified that I am speaking with the correct person using two identifiers. Location patient: home Location provider: work or home office Persons participating in the virtual visit: patient, provider  I discussed the limitations, risks, security and privacy concerns of performing an evaluation and management service by telephone and the availability of in person appointments. I also discussed with the patient that there may be a patient responsible charge related to this service. The patient expressed understanding and agreed to proceed.  Reason for visit:  Establish care  HPI:  Cassandra Robbins is a 49 yr old female with a history of ADHD, depression and Still's  Who is referred by Kirke Corin for primary care.  Her previous PCP has retired.  She has several issues she would like to discuss today.   Binge/stress  eating.  Started during the COVID 19 EPIDEMIC    Weight gain of 30 lbs  Depression and OCD behavioral traits recognized by patient as a form of anxiety .  HER ANXIETY has been aggravated her her concern for her daughter's health , who  Was recently diagnosed with POTS after several syncopal episodes. Her depression has been complicated by profound grief following the loss of a very close friend and his wife following a homicide/suicide .   Needs flu  Vaccine and mammogram (overdue by possibly  3 years) Last PAP smear 2018    ROS: See pertinent positives and negatives per HPI.  Past Medical History:   Diagnosis Date  . Adult ADHD   . Conductive hearing loss in right ear   . Depression   . 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 LAPAROSCOPIC ENDOMETRIOSIS FULGURATION  2007  . SEPTOPLASTY  2005  . TUBAL LIGATION      Family History  Problem Relation Age of Onset  . Asthma Mother   . Lung disease Mother   . Thyroid disease Mother   . Cancer Father 70    SOCIAL HX:  reports that she has never smoked. She has never used smokeless tobacco. She reports current alcohol use. She reports that she does not use drugs.   Current Outpatient Medications:  .  amphetamine-dextroamphetamine (ADDERALL) 20 MG tablet, Take 1 tablet (20 mg total) by mouth 2 (two) times daily., Disp: 180 tablet, Rfl: 0 .  fluticasone (FLONASE) 50 MCG/ACT nasal spray, Place 2 sprays into both nostrils daily., Disp: 16 g, Rfl: 11 .  buPROPion (WELLBUTRIN XL) 150 MG 24 hr tablet, Take 1 tablet (150 mg total) by mouth daily., Disp: 30 tablet, Rfl: 2  EXAM:  VITALS per patient if applicable:  GENERAL: alert, oriented, appears well and in no acute distress  HEENT: atraumatic, conjunttiva clear, no obvious abnormalities on inspection of external nose and ears  NECK: normal movements of the head and neck  LUNGS: on inspection no signs of respiratory distress, breathing rate appears normal, no obvious gross SOB, gasping or wheezing  CV: no obvious cyanosis  Cassandra: moves all  visible extremities without noticeable abnormality  PSYCH/NEURO: pleasant and cooperative, no obvious depression or anxiety, speech and thought processing grossly intact  ASSESSMENT AND PLAN:  Discussed the following assessment and plan:  Encounter for screening mammogram for malignant neoplasm of breast - Plan: 3d mammogram [facility]  Weight gain - Plan: Comprehensive metabolic panel, Hemoglobin A1c, Thyroid Panel With TSH, Lipid panel  Adult ADHD  Recurrent major depressive disorder, in partial  remission (HCC)  GAD (generalized anxiety disorder)  Adult ADHD Managed with Ritalin, usually only the morning dose. Refill history confirmed via [place] Controlled Substance databas, accessed by me today (Last refill was June 2019,  [address],  Written by Vonita Moss)..  Recurrent major depressive disorder, in partial remission (HCC) Resuming wellbutrin given her overeating and ADD   GAD (generalized anxiety disorder) Discussed adding sertraline if the welbutrin aggravates her symptoms  .  Sertraline chosen for management of obsessive/compulsive habits self described.   Weight gain 30 lb weight gain in the past year reported.  Stress eating habits reported.  I have addressed  BMI and recommended a low glycemic index diet utilizing smaller more frequent meals to increase metabolism.  I have also recommended that patient start exercising with a goal of 30 minutes of aerobic exercise a minimum of 5 days per week. Screening for lipid disorders, thyroid and diabetes to be done today.      I discussed the assessment and treatment plan with the patient. The patient was provided an opportunity to ask questions and all were answered. The patient agreed with the plan and demonstrated an understanding of the instructions.   The patient was advised to call back or seek an in-person evaluation if the symptoms worsen or if the condition fails to improve as anticipated.    Sherlene Shams, MD

## 2019-05-08 NOTE — Patient Instructions (Signed)
Welcome!!!!!!  TO RECAP:   Starting wellbutrin XL 150 mg daily.    No changes for two weeks (UNLESS NOT TOLERATING)   Your annual mammogram has been ordered.  You are encouraged (required) to call to make your appointment at [facility]  [phone]   Fasting labs soon  (office will call)

## 2019-05-10 ENCOUNTER — Encounter: Payer: Self-pay | Admitting: Internal Medicine

## 2019-05-10 DIAGNOSIS — R635 Abnormal weight gain: Secondary | ICD-10-CM | POA: Insufficient documentation

## 2019-05-10 DIAGNOSIS — F411 Generalized anxiety disorder: Secondary | ICD-10-CM | POA: Insufficient documentation

## 2019-05-10 NOTE — Assessment & Plan Note (Addendum)
Managed with Ritalin, usually only the morning dose. Refill history confirmed via [place] Controlled Substance databas, accessed by me today (Last refill was June 2019,  [address],  Written by Vonita Moss).Marland Kitchen

## 2019-05-10 NOTE — Assessment & Plan Note (Signed)
30 lb weight gain in the past year reported.  Stress eating habits reported.  I have addressed  BMI and recommended a low glycemic index diet utilizing smaller more frequent meals to increase metabolism.  I have also recommended that patient start exercising with a goal of 30 minutes of aerobic exercise a minimum of 5 days per week. Screening for lipid disorders, thyroid and diabetes to be done today.

## 2019-05-10 NOTE — Assessment & Plan Note (Signed)
Discussed adding sertraline if the welbutrin aggravates her symptoms  .  Sertraline chosen for management of obsessive/compulsive habits self described.

## 2019-05-10 NOTE — Assessment & Plan Note (Signed)
Resuming wellbutrin given her overeating and ADD

## 2019-05-26 ENCOUNTER — Encounter: Payer: Self-pay | Admitting: Internal Medicine

## 2020-03-21 DIAGNOSIS — U071 COVID-19: Secondary | ICD-10-CM

## 2020-03-21 HISTORY — DX: COVID-19: U07.1

## 2020-11-16 ENCOUNTER — Ambulatory Visit
Admission: EM | Admit: 2020-11-16 | Discharge: 2020-11-16 | Disposition: A | Discharge status: Home / Self Care | Payer: 59 | Attending: Sports Medicine | Admitting: Sports Medicine

## 2020-11-16 ENCOUNTER — Other Ambulatory Visit: Payer: Self-pay

## 2020-11-16 ENCOUNTER — Ambulatory Visit (INDEPENDENT_AMBULATORY_CARE_PROVIDER_SITE_OTHER)
Admit: 2020-11-16 | Discharge: 2020-11-16 | Disposition: A | Discharge status: Home / Self Care | Payer: 59 | Attending: Emergency Medicine | Admitting: Emergency Medicine

## 2020-11-16 ENCOUNTER — Ambulatory Visit: Payer: Self-pay

## 2020-11-16 DIAGNOSIS — R109 Unspecified abdominal pain: Secondary | ICD-10-CM | POA: Insufficient documentation

## 2020-11-16 LAB — CBC WITH DIFFERENTIAL/PLATELET
Abs Immature Granulocytes: 0.02 10*3/uL (ref 0.00–0.07)
Basophils Absolute: 0 10*3/uL (ref 0.0–0.1)
Basophils Relative: 1 %
Eosinophils Absolute: 0.2 10*3/uL (ref 0.0–0.5)
Eosinophils Relative: 3 %
HCT: 42.8 % (ref 36.0–46.0)
Hemoglobin: 14.6 g/dL (ref 12.0–15.0)
Immature Granulocytes: 0 %
Lymphocytes Relative: 27 %
Lymphs Abs: 2 10*3/uL (ref 0.7–4.0)
MCH: 29.5 pg (ref 26.0–34.0)
MCHC: 34.1 g/dL (ref 30.0–36.0)
MCV: 86.5 fL (ref 80.0–100.0)
Monocytes Absolute: 0.4 10*3/uL (ref 0.1–1.0)
Monocytes Relative: 6 %
Neutro Abs: 4.5 10*3/uL (ref 1.7–7.7)
Neutrophils Relative %: 63 %
Platelets: 281 10*3/uL (ref 150–400)
RBC: 4.95 MIL/uL (ref 3.87–5.11)
RDW: 12.6 % (ref 11.5–15.5)
WBC: 7.1 10*3/uL (ref 4.0–10.5)
nRBC: 0 % (ref 0.0–0.2)

## 2020-11-16 LAB — URINALYSIS, COMPLETE (UACMP) WITH MICROSCOPIC
Bilirubin Urine: NEGATIVE
Glucose, UA: NEGATIVE mg/dL
Hgb urine dipstick: NEGATIVE
Ketones, ur: NEGATIVE mg/dL
Nitrite: NEGATIVE
Protein, ur: NEGATIVE mg/dL
Specific Gravity, Urine: 1.01 (ref 1.005–1.030)
pH: 6.5 (ref 5.0–8.0)

## 2020-11-16 LAB — COMPREHENSIVE METABOLIC PANEL
ALT: 21 U/L (ref 0–44)
AST: 21 U/L (ref 15–41)
Albumin: 4.5 g/dL (ref 3.5–5.0)
Alkaline Phosphatase: 89 U/L (ref 38–126)
Anion gap: 6 (ref 5–15)
BUN: 17 mg/dL (ref 6–20)
CO2: 26 mmol/L (ref 22–32)
Calcium: 9.3 mg/dL (ref 8.9–10.3)
Chloride: 102 mmol/L (ref 98–111)
Creatinine, Ser: 0.73 mg/dL (ref 0.44–1.00)
GFR, Estimated: >60 mL/min (ref >60)
Glucose, Bld: 102 mg/dL — ABNORMAL HIGH (ref 70–99)
Potassium: 4.5 mmol/L (ref 3.5–5.1)
Sodium: 134 mmol/L — ABNORMAL LOW (ref 135–145)
Total Bilirubin: 0.5 mg/dL (ref 0.3–1.2)
Total Protein: 8.4 g/dL — ABNORMAL HIGH (ref 6.5–8.1)

## 2020-11-16 MED ORDER — KETOROLAC TROMETHAMINE 60 MG/2ML IM SOLN
60.0000 mg | Freq: Once | INTRAMUSCULAR | Status: AC
  Administered 2020-11-16: 60 mg via INTRAMUSCULAR

## 2020-11-16 MED ORDER — PHENAZOPYRIDINE HCL 200 MG PO TABS: 200.0000 mg | ORAL_TABLET | Freq: Three times a day (TID) | ORAL | 0 refills | Status: DC

## 2020-11-16 MED ORDER — CIPROFLOXACIN HCL 500 MG PO TABS: 500.0000 mg | ORAL_TABLET | Freq: Two times a day (BID) | ORAL | 0 refills | Status: AC

## 2020-11-16 NOTE — Discharge Instructions (Addendum)
Take the Cipro twice daily for 5 days with food for treatment of urinary tract infection.  Use the Pyridium every 8 hours as needed for urinary discomfort.  This will turn your urine a bright red-orange.  Increase your oral fluid intake so that you increase your urine production and or flushing your urinary system.  Take an over-the-counter probiotic, such as Culturelle-Align-Activia, 1 hour after each dose of antibiotic to prevent diarrhea or yeast infections from forming.  We will culture urine and change the antibiotics if necessary.  Return for reevaluation, or see your primary care provider, for any new or worsening symptoms.  

## 2020-11-16 NOTE — ED Triage Notes (Signed)
Pt c/o lower back pain that radiates to her right abdominal area for the last two days with some nausea.

## 2020-11-16 NOTE — ED Notes (Signed)
CT Abd/Pelvis Auth# [id number]

## 2020-11-16 NOTE — ED Provider Notes (Signed)
[facility]    CSN: [id number] Arrival date & time: 11/16/20  1014      History   Chief Complaint Chief Complaint  Patient presents with   Back Pain    HPI CHELLI YERKES is a 50 y.o. female.   HPI  50 year old female here for evaluation of right-sided flank pain with radiation to right groin.  Patient ports that she has had right flank pain which radiates to her right groin for the past 2 days.  This is associated with nausea and increased urinary frequency.  She states that the pain in her right flank increases after she voids.  She states there is nothing she can do to improve the pain and does not increase by movement.  She denies any fevers or chills but states that she has felt more cold at home and had to wear a jacket.  She denies any falls or heavy lifting.  She has no pain with urination or sending blood in her urine.  Patient does not have a history of kidney stones but her sister has had kidney stones.  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      Home Medications    Prior to Admission medications   Medication Sig Start Date End Date Taking? Authorizing Provider  amphetamine-dextroamphetamine (ADDERALL) 20 MG tablet Take 1 tablet (20 mg total) by mouth 2 (two) times daily. 10/16/18  Yes Crissman, Redge Gainer, MD  ciprofloxacin (CIPRO) 500 MG tablet Take 1 tablet (500 mg total) by mouth 2 (two) times daily for 5  days. 11/16/20 11/21/20 Yes Becky Augusta, NP  fluticasone [facility]) 50 MCG/ACT nasal spray Place 2 sprays into both nostrils daily. 04/15/18  Yes Crissman, Redge Gainer, MD  phenazopyridine (PYRIDIUM) 200 MG tablet Take 1 tablet (200 mg total) by mouth 3 (three) times daily. 11/16/20  Yes Becky Augusta, NP  buPROPion (WELLBUTRIN XL) 150 MG 24 hr tablet Take 1 tablet (150 mg total) by mouth daily. 05/08/19   Sherlene Shams, MD    Family History Family History  Problem Relation Age of Onset   Asthma Mother    Lung disease Mother    Thyroid disease Mother    Cancer Father 65    Social History Social History   Tobacco Use   Smoking status: Never   Smokeless tobacco: Never  Vaping Use   Vaping Use: Never used  Substance Use Topics   Alcohol use: Yes    Comment: on occasion   Drug use: No     Allergies   Penicillins and Amoxicillin   Review of Systems Review of Systems  Constitutional:  Negative for activity change, appetite change and fever.  Genitourinary:  Positive for flank pain and frequency. Negative for dysuria, hematuria and urgency.  Musculoskeletal:  Positive for back pain.  Hematological: Negative.   Psychiatric/Behavioral: Negative.  Physical Exam Triage Vital Signs ED Triage Vitals  Enc Vitals Group     BP 11/16/20 1054 120/80     Pulse Rate 11/16/20 1054 94     Resp 11/16/20 1054 16     Temp 11/16/20 1054 99.5 F (37.5 C)     Temp Source 11/16/20 1054 Oral     SpO2 11/16/20 1054 99 %     Weight 11/16/20 1053 230 lb (104.3 kg)     Height --      Head Circumference --      Peak Flow --      Pain Score 11/16/20 1053 4     Pain Loc --      Pain Edu? --      Excl. in GC? --    No data found.  Updated Vital Signs BP 120/80 (BP Location: Left Arm)   Pulse 94   Temp 99.5 F (37.5 C) (Oral)   Resp 16   Wt 230 lb (104.3 kg)   SpO2 99%   BMI 33.97 kg/m   Visual Acuity Right Eye Distance:   Left Eye Distance:   Bilateral Distance:    Right Eye  Near:   Left Eye Near:    Bilateral Near:     Physical Exam Vitals and nursing note reviewed.  Constitutional:      General: She is not in acute distress.    Appearance: Normal appearance. She is obese. She is not ill-appearing.  HENT:     Head: Normocephalic and atraumatic.  Cardiovascular:     Rate and Rhythm: Normal rate and regular rhythm.     Pulses: Normal pulses.     Heart sounds: Normal heart sounds. No murmur heard.   No gallop.  Pulmonary:     Effort: Pulmonary effort is normal.     Breath sounds: Normal breath sounds. No wheezing, rhonchi or rales.  Abdominal:     General: Bowel sounds are normal. There is no distension.     Palpations: Abdomen is soft.     Tenderness: There is abdominal tenderness. There is right CVA tenderness. There is no left CVA tenderness, guarding or rebound.  Skin:    General: Skin is warm and dry.     Capillary Refill: Capillary refill takes less than 2 seconds.     Findings: No erythema or rash.  Neurological:     General: No focal deficit present.     Mental Status: She is alert and oriented to person, place, and time.  Psychiatric:        Mood and Affect: Mood normal.        Behavior: Behavior normal.        Thought Content: Thought content normal.        Judgment: Judgment normal.     UC Treatments / Results  Labs (all labs ordered are listed, but only abnormal results are displayed) Labs Reviewed  URINALYSIS, COMPLETE (UACMP) WITH MICROSCOPIC - Abnormal; Notable for the following components:      Result Value   APPearance HAZY (*)    Leukocytes,Ua SMALL (*)    Bacteria, UA MANY (*)    All other components within normal limits  COMPREHENSIVE METABOLIC PANEL - Abnormal; Notable for the following components:   Sodium 134 (*)    Glucose, Bld 102 (*)    Total Protein 8.4 (*)    All other components within normal limits  URINE CULTURE  CBC WITH DIFFERENTIAL/PLATELET    EKG   Radiology CT  ABDOMEN PELVIS WO  CONTRAST  Result Date: 11/16/2020 CLINICAL DATA:  Flank pain, kidney stone suspected EXAM: CT ABDOMEN AND PELVIS WITHOUT CONTRAST TECHNIQUE: Multidetector CT imaging of the abdomen and pelvis was performed following the standard protocol without IV contrast. COMPARISON:  None. FINDINGS: Lower chest: Right middle lobe scarring/linear atelectasis. No acute abnormality. Hepatobiliary: No focal liver abnormality is seen. No gallstones, gallbladder wall thickening, or biliary dilatation. Pancreas: Unremarkable. No pancreatic ductal dilatation or surrounding inflammatory changes. Spleen: Normal in size without focal abnormality. Adrenals/Urinary Tract: There is a 1.6 cm right adrenal low-density nodule with less than 10 Hounsfield units, statistically likely to be an adenoma. Normal left adrenal gland. No hydronephrosis. There is a 2 mm nonobstructive stone in the right mid kidney. There is no ureterolithiasis. Bladder is unremarkable. Stomach/Bowel: Stomach is within normal limits. There is no evidence of bowel obstruction. Appendix is normal. Scattered colonic diverticuli. No evidence of diverticulitis. Vascular/Lymphatic: No significant vascular findings are present. No enlarged abdominal or pelvic lymph nodes. Reproductive: Unremarkable. Other: No abdominal wall hernia or abnormality. No abdominopelvic ascites. Musculoskeletal: No acute osseous abnormality. No suspicious lytic or blastic lesions. Multilevel degenerative changes of the spine. IMPRESSION: No evidence of obstructive uropathy. Nonobstructive 2 mm stone in the right mid kidney. No other acute findings in the abdomen or pelvis. Electronically Signed   By: Caprice Renshaw   On: 11/16/2020 13:18    Procedures Procedures (including critical care time)  Medications Ordered in UC Medications  ketorolac (TORADOL) injection 60 mg (60 mg Intramuscular Given 11/16/20 1242)    Initial Impression / Assessment and Plan / UC Course  I have reviewed the triage  vital signs and the nursing notes.  Pertinent labs & imaging results that were available during my care of the patient were reviewed by me and considered in my medical decision making (see chart for details).  Patient is a very pleasant 50 year old female who does appear to be in a moderate degree of pain here for evaluation of right flank pain that radiates to her right groin has been present for last 2 days.  This is associated with nausea.  Patient states that voiding increases the pain in her right flank but she does not have any pee with urination.  She is also no urgency or hematuria.  Patient's physical exam reveals a benign cardiopulmonary exam with clear lungs sounds in all fields.  Patient does have right CVA tenderness to mild percussion.  She states that after I talked to her back the pain in her right flank increased.  She also has some tenderness with palpation of the right lumbar paraspinous region.  Abdomen is protuberant but soft with positive bowel sounds in all quadrants.  Patient has no suprapubic tenderness.  She does complain that when palpating the right upper quadrant she has pain in her right groin.  Patient also has right lower quadrant pain and tenderness over McBurney's point.  Urinalysis collected at triage does not show the presence of blood it does have white blood cells and many bacteria with an equal mix of epithelial cells and WBCs.  Patient's exam is concerning for possibility of a stone versus appendicitis.  Will check CBC, CMP, and obtain CT of the abdomen and pelvis.  CBC is unremarkable.  CMP shows mild hyponatremia with a sodium of 134.  Potassium is 4.5 and normal.  Mildly elevated glucose of 102 and mildly elevated total protein of 8.4 otherwise all other values and transaminases are normal.  Patient  is awaiting CT scan and is complaining of increased pain in the right flank.  Patient offered Toradol injection and she agreed so will medicate patient with 60 mg of  Toradol IM.  CT scan of the abdomen pelvis is negative for ureterolithiasis or appendicitis.  There is a 2 mm nonobstructing stone in the right mid kidney.  We will treat patient for UTI with Cipro twice daily for 5 days and Pyridium to help with urinary discomfort.   Final Clinical Impressions(s) / UC Diagnoses   Final diagnoses:  Acute right flank pain     Discharge Instructions      Take the Cipro twice daily for 5 days with food for treatment of urinary tract infection.  Use the Pyridium every 8 hours as needed for urinary discomfort.  This will turn your urine a bright red-orange.  Increase your oral fluid intake so that you increase your urine production and or flushing your urinary system.  Take an over-the-counter probiotic, such as Culturelle-Align-Activia, 1 hour after each dose of antibiotic to prevent diarrhea or yeast infections from forming.  We will culture urine and change the antibiotics if necessary.  Return for reevaluation, or see your primary care provider, for any new or worsening symptoms.      ED Prescriptions     Medication Sig Dispense Auth. Provider   ciprofloxacin (CIPRO) 500 MG tablet Take 1 tablet (500 mg total) by mouth 2 (two) times daily for 5 days. 10 tablet Becky Augustayan, Kari Kerth, NP   phenazopyridine (PYRIDIUM) 200 MG tablet Take 1 tablet (200 mg total) by mouth 3 (three) times daily. 6 tablet Becky Augustayan, Jamyra Zweig, NP      PDMP not reviewed this encounter.   Becky Augustayan, Rutger Salton, NP 11/16/20 1332

## 2020-11-17 ENCOUNTER — Telehealth (INDEPENDENT_AMBULATORY_CARE_PROVIDER_SITE_OTHER): Payer: 59 | Admitting: Internal Medicine

## 2020-11-17 ENCOUNTER — Encounter: Payer: Self-pay | Admitting: Internal Medicine

## 2020-11-17 DIAGNOSIS — B029 Zoster without complications: Secondary | ICD-10-CM

## 2020-11-17 MED ORDER — VALACYCLOVIR HCL 1 G PO TABS: 1000.0000 mg | ORAL_TABLET | Freq: Two times a day (BID) | ORAL | 0 refills | Status: AC

## 2020-11-17 MED ORDER — METHYLPREDNISOLONE 4 MG PO TBPK: ORAL_TABLET | ORAL | 0 refills | Status: DC

## 2020-11-17 MED ORDER — PREGABALIN 50 MG PO CAPS: 50.0000 mg | ORAL_CAPSULE | Freq: Every day | ORAL | 2 refills | Status: DC

## 2020-11-17 NOTE — Progress Notes (Signed)
There were no vitals taken for this visit.   Subjective:    Patient ID: Cassandra Robbins, female    DOB: 1970/12/01, 50 y.o.   MRN: [id number]  Chief Complaint  Patient presents with  . Rash    HPI: Cassandra Robbins is a 50 y.o. female   This visit was completed via video visit through MyChart due to the restrictions of the COVID-19 pandemic. All issues as above were discussed and addressed. Physical exam was done as above through visual confirmation on video through MyChart. If it was felt that the patient should be evaluated in the office, they were directed there. The patient verbally consented to this visit. Location of the patient: home Location of the provider: work Those involved with this call:  Provider: Loura Pardon, MD CMA: Tristan Schroeder, CMA Front Desk/Registration: Harriet Pho  Time spent on call: 10 minutes with patient face to face via video conference. More than 50% of this time was spent in counseling and coordination of care. 10 minutes total spent in review of patient's record and preparation of their chart.    Rash This is a new (went to UC yesterday had flank pain and started hurting x 2 days before this. had a kidney stone and was diagnosed with such) problem. The current episode started yesterday. The affected locations include the right hip. The rash is characterized by pain, redness and itchiness (2 " wide comes around like the dermatome feels like its on fire.). Pertinent negatives include no rhinorrhea or shortness of breath.   Chief Complaint  Patient presents with  . Rash    Relevant past medical, surgical, family and social history reviewed and updated as indicated. Interim medical history since our last visit reviewed. Allergies and medications reviewed and updated.  Review of Systems  HENT:  Negative for rhinorrhea.   Respiratory:  Negative for shortness of breath.   Skin:  Positive for rash.   Per HPI unless specifically indicated above      Objective:    There were no vitals taken for this visit.  Wt Readings from Last 3 Encounters:  11/16/20 230 lb (104.3 kg)  05/08/19 232 lb (105.2 kg)  04/15/18 232 lb (105.2 kg)    Physical Exam Vitals and nursing note reviewed.  Constitutional:      General: She is not in acute distress.    Appearance: Normal appearance. She is not ill-appearing or diaphoretic.  Eyes:     Conjunctiva/sclera: Conjunctivae normal.  Pulmonary:     Breath sounds: No rhonchi.  Abdominal:     General: Abdomen is flat. Bowel sounds are normal. There is no distension.     Palpations: Abdomen is soft. There is no mass.     Tenderness: There is no abdominal tenderness. There is no guarding.  Skin:    General: Skin is warm and dry.     Coloration: Skin is not jaundiced.     Findings: Lesion and rash present. No erythema.  Neurological:     Mental Status: She is alert.    Results for orders placed or performed during the hospital encounter of 11/16/20  Urinalysis, Complete w Microscopic  Result Value Ref Range   Color, Urine YELLOW YELLOW   APPearance HAZY (A) CLEAR   Specific Gravity, Urine 1.010 1.005 - 1.030   pH 6.5 5.0 - 8.0   Glucose, UA NEGATIVE NEGATIVE mg/dL   Hgb urine dipstick NEGATIVE NEGATIVE   Bilirubin Urine NEGATIVE NEGATIVE   Ketones, ur  NEGATIVE NEGATIVE mg/dL   Protein, ur NEGATIVE NEGATIVE mg/dL   Nitrite NEGATIVE NEGATIVE   Leukocytes,Ua SMALL (A) NEGATIVE   Squamous Epithelial / LPF 6-10 0 - 5   WBC, UA 6-10 0 - 5 WBC/hpf   RBC / HPF 0-5 0 - 5 RBC/hpf   Bacteria, UA MANY (A) NONE SEEN  CBC with Differential  Result Value Ref Range   WBC 7.1 4.0 - 10.5 K/uL   RBC 4.95 3.87 - 5.11 MIL/uL   Hemoglobin 14.6 12.0 - 15.0 g/dL   HCT 27.2 53.6 - 64.4 %   MCV 86.5 80.0 - 100.0 fL   MCH 29.5 26.0 - 34.0 pg   MCHC 34.1 30.0 - 36.0 g/dL   RDW 03.4 74.2 - 59.5 %   Platelets 281 150 - 400 K/uL   nRBC 0.0 0.0 - 0.2 %   Neutrophils Relative % 63 %   Neutro Abs 4.5 1.7 - 7.7  K/uL   Lymphocytes Relative 27 %   Lymphs Abs 2.0 0.7 - 4.0 K/uL   Monocytes Relative 6 %   Monocytes Absolute 0.4 0.1 - 1.0 K/uL   Eosinophils Relative 3 %   Eosinophils Absolute 0.2 0.0 - 0.5 K/uL   Basophils Relative 1 %   Basophils Absolute 0.0 0.0 - 0.1 K/uL   Immature Granulocytes 0 %   Abs Immature Granulocytes 0.02 0.00 - 0.07 K/uL  Comprehensive metabolic panel  Result Value Ref Range   Sodium 134 (L) 135 - 145 mmol/L   Potassium 4.5 3.5 - 5.1 mmol/L   Chloride 102 98 - 111 mmol/L   CO2 26 22 - 32 mmol/L   Glucose, Bld 102 (H) 70 - 99 mg/dL   BUN 17 6 - 20 mg/dL   Creatinine, Ser 6.38 0.44 - 1.00 mg/dL   Calcium 9.3 8.9 - 75.6 mg/dL   Total Protein 8.4 (H) 6.5 - 8.1 g/dL   Albumin 4.5 3.5 - 5.0 g/dL   AST 21 15 - 41 U/L   ALT 21 0 - 44 U/L   Alkaline Phosphatase 89 38 - 126 U/L   Total Bilirubin 0.5 0.3 - 1.2 mg/dL   GFR, Estimated >43 >32 mL/min   Anion gap 6 5 - 15        Current Outpatient Medications:  .  amphetamine-dextroamphetamine (ADDERALL) 20 MG tablet, Take 1 tablet (20 mg total) by mouth 2 (two) times daily., Disp: 180 tablet, Rfl: 0 .  ciprofloxacin (CIPRO) 500 MG tablet, Take 1 tablet (500 mg total) by mouth 2 (two) times daily for 5 days., Disp: 10 tablet, Rfl: 0 .  fluticasone (FLONASE) 50 MCG/ACT nasal spray, Place 2 sprays into both nostrils daily., Disp: 16 g, Rfl: 11 .  phenazopyridine (PYRIDIUM) 200 MG tablet, Take 1 tablet (200 mg total) by mouth 3 (three) times daily., Disp: 6 tablet, Rfl: 0    Assessment & Plan:   Shingles.  Will start pt on valtrex Medrol dose pak Will need to start lyrica for pain  Fu with me to establish care   Problem List Items Addressed This Visit   None    No orders of the defined types were placed in this encounter.    No orders of the defined types were placed in this encounter.    Follow up plan: No follow-ups on file.

## 2020-11-18 LAB — URINE CULTURE

## 2020-11-22 ENCOUNTER — Ambulatory Visit: Payer: 59

## 2020-11-24 ENCOUNTER — Encounter: Payer: Self-pay | Admitting: Internal Medicine

## 2020-11-28 NOTE — Telephone Encounter (Signed)
Pl let pt know she can take this tid or if she would rather increase to 300 mg q pm only to avoid dizziness. Thnx.

## 2020-11-30 ENCOUNTER — Other Ambulatory Visit: Payer: Self-pay

## 2020-11-30 ENCOUNTER — Encounter: Payer: Self-pay | Admitting: Internal Medicine

## 2020-11-30 ENCOUNTER — Ambulatory Visit (INDEPENDENT_AMBULATORY_CARE_PROVIDER_SITE_OTHER): Payer: 59 | Admitting: Internal Medicine

## 2020-11-30 ENCOUNTER — Telehealth: Payer: Self-pay

## 2020-11-30 VITALS — BP 139/90 | HR 90 | Temp 99.2°F | Ht 68.9 in | Wt 241.4 lb

## 2020-11-30 DIAGNOSIS — B029 Zoster without complications: Secondary | ICD-10-CM | POA: Diagnosis not present

## 2020-11-30 DIAGNOSIS — Z1329 Encounter for screening for other suspected endocrine disorder: Secondary | ICD-10-CM | POA: Diagnosis not present

## 2020-11-30 DIAGNOSIS — Z136 Encounter for screening for cardiovascular disorders: Secondary | ICD-10-CM | POA: Diagnosis not present

## 2020-11-30 LAB — URINALYSIS, ROUTINE W REFLEX MICROSCOPIC
Bilirubin, UA: NEGATIVE
Glucose, UA: NEGATIVE
Ketones, UA: NEGATIVE
Leukocytes,UA: NEGATIVE
Nitrite, UA: NEGATIVE
Protein,UA: NEGATIVE
RBC, UA: NEGATIVE
Specific Gravity, UA: 1.02 (ref 1.005–1.030)
Urobilinogen, Ur: 0.2 mg/dL (ref 0.2–1.0)
pH, UA: 7.5 (ref 5.0–7.5)

## 2020-11-30 LAB — BAYER DCA HB A1C WAIVED: HB A1C (BAYER DCA - WAIVED): 5.9 % (ref <7.0)

## 2020-11-30 MED ORDER — LIDOCAINE 5 % EX PTCH: 1.0000 | MEDICATED_PATCH | CUTANEOUS | 1 refills | Status: DC

## 2020-11-30 MED ORDER — PREGABALIN 75 MG PO CAPS: 75.0000 mg | ORAL_CAPSULE | Freq: Two times a day (BID) | ORAL | 1 refills | Status: DC

## 2020-11-30 MED ORDER — AMPHETAMINE-DEXTROAMPHETAMINE 20 MG PO TABS: 20.0000 mg | ORAL_TABLET | Freq: Two times a day (BID) | ORAL | 0 refills | Status: DC

## 2020-11-30 NOTE — Telephone Encounter (Signed)
PA started for Lidocaine 5% patches today through cover my meds, awaiting on determination

## 2020-11-30 NOTE — Progress Notes (Signed)
BP 139/90   Pulse 90   Temp 99.2 F (37.3 C) (Oral)   Ht 5' 8.9" (1.75 m)   Wt 241 lb 6.4 oz (109.5 kg)   SpO2 98%   BMI 35.75 kg/m    Subjective:    Patient ID: Cassandra Robbins, female    DOB: November 30, 1970, 50 y.o.   MRN: [id number]  Chief Complaint  Patient presents with   Herpes Zoster    F/U from video visit still having a lot of pain.    nodule on adrenal gland    Found by ct on 11/16/20    HPI: Cassandra Robbins is a 50 y.o. female  Has been stressed out , her fiance has had # of face had multiple surgeries for such.  Sisters had a knee replacment and diagnosed with Hodgkins lymphoma for such. Has been stressed - Rash since then . Pain last week is worse  Has had tubal ligation, was on mirena sec to not wanting periods.  Rash This is a recurrent problem. The rash is characterized by redness and pain. Pertinent negatives include no congestion, cough, diarrhea, eye pain, facial edema, fatigue, fever, rhinorrhea, shortness of breath or vomiting.  Flank Pain This is a new problem. Pertinent negatives include no abdominal pain, chest pain, dysuria or fever.   Chief Complaint  Patient presents with   Herpes Zoster    F/U from video visit still having a lot of pain.    nodule on adrenal gland    Found by ct on 11/16/20    Relevant past medical, surgical, family and social history reviewed and updated as indicated. Interim medical history since our last visit reviewed. Allergies and medications reviewed and updated.  Review of Systems  Constitutional:  Negative for fatigue and fever.  HENT:  Negative for congestion, dental problem, drooling, ear discharge and rhinorrhea.   Eyes:  Negative for pain, discharge and itching.  Respiratory:  Negative for apnea, cough and shortness of breath.   Cardiovascular:  Negative for chest pain, palpitations and leg swelling.  Gastrointestinal:  Negative for abdominal distention, abdominal pain, anal bleeding, blood in stool,  constipation, diarrhea, nausea and vomiting.  Genitourinary:  Positive for flank pain. Negative for difficulty urinating, dyspareunia and dysuria.  Musculoskeletal:  Positive for back pain. Negative for arthralgias.  Skin:  Positive for rash.  Neurological:  Negative for dizziness and facial asymmetry.  Psychiatric/Behavioral:  Negative for sleep disturbance. The patient is nervous/anxious.    Per HPI unless specifically indicated above     Objective:    BP 139/90   Pulse 90   Temp 99.2 F (37.3 C) (Oral)   Ht 5' 8.9" (1.75 m)   Wt 241 lb 6.4 oz (109.5 kg)   SpO2 98%   BMI 35.75 kg/m   Wt Readings from Last 3 Encounters:  11/30/20 241 lb 6.4 oz (109.5 kg)  11/16/20 230 lb (104.3 kg)  05/08/19 232 lb (105.2 kg)    Physical Exam Vitals and nursing note reviewed.  Constitutional:      General: She is not in acute distress.    Appearance: Normal appearance. She is not ill-appearing or diaphoretic.  Eyes:     Conjunctiva/sclera: Conjunctivae normal.  Pulmonary:     Breath sounds: No rhonchi.  Abdominal:     General: Abdomen is flat. Bowel sounds are normal. There is no distension.     Palpations: Abdomen is soft. There is no mass.     Tenderness: There is no  abdominal tenderness. There is no guarding.  Skin:    General: Skin is warm and dry.     Coloration: Skin is not jaundiced.     Findings: Erythema and rash present.     Comments: Right side of the lower abdomen / groin healing no vesicles  Neurological:     Mental Status: She is alert.    Results for orders placed or performed during the hospital encounter of 11/16/20  Urine Culture   Specimen: Urine, Clean Catch  Result Value Ref Range   Specimen Description      URINE, CLEAN CATCH Performed at [facility], [address] [place], [place] [address]    Special Requests      NONE Performed at [facility], [address], [place], [place] [address]    Culture MULTIPLE SPECIES  PRESENT, SUGGEST RECOLLECTION (A)    Report Status 11/18/2020 FINAL   Urinalysis, Complete w Microscopic  Result Value Ref Range   Color, Urine YELLOW YELLOW   APPearance HAZY (A) CLEAR   Specific Gravity, Urine 1.010 1.005 - 1.030   pH 6.5 5.0 - 8.0   Glucose, UA NEGATIVE NEGATIVE mg/dL   Hgb urine dipstick NEGATIVE NEGATIVE   Bilirubin Urine NEGATIVE NEGATIVE   Ketones, ur NEGATIVE NEGATIVE mg/dL   Protein, ur NEGATIVE NEGATIVE mg/dL   Nitrite NEGATIVE NEGATIVE   Leukocytes,Ua SMALL (A) NEGATIVE   Squamous Epithelial / LPF 6-10 0 - 5   WBC, UA 6-10 0 - 5 WBC/hpf   RBC / HPF 0-5 0 - 5 RBC/hpf   Bacteria, UA MANY (A) NONE SEEN  CBC with Differential  Result Value Ref Range   WBC 7.1 4.0 - 10.5 K/uL   RBC 4.95 3.87 - 5.11 MIL/uL   Hemoglobin 14.6 12.0 - 15.0 g/dL   HCT 15.4 00.8 - 67.6 %   MCV 86.5 80.0 - 100.0 fL   MCH 29.5 26.0 - 34.0 pg   MCHC 34.1 30.0 - 36.0 g/dL   RDW 19.5 09.3 - 26.7 %   Platelets 281 150 - 400 K/uL   nRBC 0.0 0.0 - 0.2 %   Neutrophils Relative % 63 %   Neutro Abs 4.5 1.7 - 7.7 K/uL   Lymphocytes Relative 27 %   Lymphs Abs 2.0 0.7 - 4.0 K/uL   Monocytes Relative 6 %   Monocytes Absolute 0.4 0.1 - 1.0 K/uL   Eosinophils Relative 3 %   Eosinophils Absolute 0.2 0.0 - 0.5 K/uL   Basophils Relative 1 %   Basophils Absolute 0.0 0.0 - 0.1 K/uL   Immature Granulocytes 0 %   Abs Immature Granulocytes 0.02 0.00 - 0.07 K/uL  Comprehensive metabolic panel  Result Value Ref Range   Sodium 134 (L) 135 - 145 mmol/L   Potassium 4.5 3.5 - 5.1 mmol/L   Chloride 102 98 - 111 mmol/L   CO2 26 22 - 32 mmol/L   Glucose, Bld 102 (H) 70 - 99 mg/dL   BUN 17 6 - 20 mg/dL   Creatinine, Ser 1.24 0.44 - 1.00 mg/dL   Calcium 9.3 8.9 - 58.0 mg/dL   Total Protein 8.4 (H) 6.5 - 8.1 g/dL   Albumin 4.5 3.5 - 5.0 g/dL   AST 21 15 - 41 U/L   ALT 21 0 - 44 U/L   Alkaline Phosphatase 89 38 - 126 U/L   Total Bilirubin 0.5 0.3 - 1.2 mg/dL   GFR, Estimated >99 >83 mL/min    Anion  gap 6 5 - 15        Current Outpatient Medications:    amphetamine-dextroamphetamine (ADDERALL) 20 MG tablet, Take 1 tablet (20 mg total) by mouth 2 (two) times daily., Disp: 180 tablet, Rfl: 0   fluticasone (FLONASE) 50 MCG/ACT nasal spray, Place 2 sprays into both nostrils daily., Disp: 16 g, Rfl: 11   pregabalin (LYRICA) 50 MG capsule, Take 1 capsule (50 mg total) by mouth daily., Disp: 30 capsule, Rfl: 2   methylPREDNISolone (MEDROL DOSEPAK) 4 MG TBPK tablet, Use as directed (Patient not taking: Reported on 11/30/2020), Disp: 1 each, Rfl: 0   phenazopyridine (PYRIDIUM) 200 MG tablet, Take 1 tablet (200 mg total) by mouth 3 (three) times daily. (Patient not taking: Reported on 11/30/2020), Disp: 6 tablet, Rfl: 0    No evidence of obstructive uropathy. Nonobstructive 2 mm stone in the right mid kidney.   No other acute findings in the abdomen or pelvis. Assessment & Plan:  ADD was on adderall for such, off of this - 6 months ago  Will restart such 2 months supplyu provided  Pt to come back for a phsycial   2. Shingles:  will need to increase lyrica to 75 mg bid.  Check a1c -- in office -- 5.9 pt says he has put on a lot of weight Lifestyle modifications advised to pt. A1c at   Portion control and avoiding high carb low fat diet advised.  Diet plan given to pt   exercise plan given and encouraged.  To increase exercise to 150 mins a week ie 21/2 hours a week. Pt verbalises understanding of the above.    3. Nephrolithiasis will check UA as last one from [place] [facility] was abnl  4. Asymptomatic UTI  Will recheck   Problem List Items Addressed This Visit   None    Orders Placed This Encounter  Procedures   Bayer DCA Hb A1c Waived (STAT)   CBC with Differential/Platelet   Comprehensive metabolic panel   Urinalysis, Routine w reflex microscopic     Meds ordered this encounter  Medications   pregabalin (LYRICA) 75 MG capsule    Sig: Take 1 capsule (75 mg total) by mouth 2  (two) times daily.    Dispense:  60 capsule    Refill:  1   lidocaine (LIDODERM) 5 %    Sig: Place 1 patch onto the skin daily. Remove & Discard patch within 12 hours or as directed by MD    Dispense:  30 patch    Refill:  1   amphetamine-dextroamphetamine (ADDERALL) 20 MG tablet    Sig: Take 1 tablet (20 mg total) by mouth 2 (two) times daily.    Dispense:  60 tablet    Refill:  0     Follow up plan: No follow-ups on file.

## 2020-12-01 LAB — CBC WITH DIFFERENTIAL/PLATELET
Basophils Absolute: 0.1 10*3/uL (ref 0.0–0.2)
Basos: 1 %
EOS (ABSOLUTE): 0.2 10*3/uL (ref 0.0–0.4)
Eos: 3 %
Hematocrit: 43.3 % (ref 34.0–46.6)
Hemoglobin: 14.4 g/dL (ref 11.1–15.9)
Immature Grans (Abs): 0 10*3/uL (ref 0.0–0.1)
Immature Granulocytes: 0 %
Lymphocytes Absolute: 2.3 10*3/uL (ref 0.7–3.1)
Lymphs: 34 %
MCH: 29.4 pg (ref 26.6–33.0)
MCHC: 33.3 g/dL (ref 31.5–35.7)
MCV: 89 fL (ref 79–97)
Monocytes Absolute: 0.5 10*3/uL (ref 0.1–0.9)
Monocytes: 7 %
Neutrophils Absolute: 3.8 10*3/uL (ref 1.4–7.0)
Neutrophils: 55 %
Platelets: 328 10*3/uL (ref 150–450)
RBC: 4.89 x10E6/uL (ref 3.77–5.28)
RDW: 12.5 % (ref 11.7–15.4)
WBC: 6.8 10*3/uL (ref 3.4–10.8)

## 2020-12-01 LAB — COMPREHENSIVE METABOLIC PANEL
ALT: 27 IU/L (ref 0–32)
AST: 21 IU/L (ref 0–40)
Albumin/Globulin Ratio: 1.6 (ref 1.2–2.2)
Albumin: 4.4 g/dL (ref 3.8–4.9)
Alkaline Phosphatase: 104 IU/L (ref 44–121)
BUN/Creatinine Ratio: 23 (ref 9–23)
BUN: 16 mg/dL (ref 6–24)
Bilirubin Total: 0.3 mg/dL (ref 0.0–1.2)
CO2: 22 mmol/L (ref 20–29)
Calcium: 9.1 mg/dL (ref 8.7–10.2)
Chloride: 101 mmol/L (ref 96–106)
Creatinine, Ser: 0.71 mg/dL (ref 0.57–1.00)
Globulin, Total: 2.7 g/dL (ref 1.5–4.5)
Glucose: 93 mg/dL (ref 65–99)
Potassium: 4.4 mmol/L (ref 3.5–5.2)
Sodium: 140 mmol/L (ref 134–144)
Total Protein: 7.1 g/dL (ref 6.0–8.5)
eGFR: 103 mL/min/{1.73_m2} (ref >59)

## 2020-12-02 ENCOUNTER — Telehealth: Payer: Self-pay

## 2020-12-02 ENCOUNTER — Ambulatory Visit: Payer: 59 | Admitting: Internal Medicine

## 2020-12-02 NOTE — Telephone Encounter (Signed)
PA for Lidocaine 5% patches has been denied. 

## 2021-01-26 ENCOUNTER — Other Ambulatory Visit: Payer: Self-pay

## 2021-01-26 ENCOUNTER — Other Ambulatory Visit: Payer: 59

## 2021-01-26 DIAGNOSIS — B029 Zoster without complications: Secondary | ICD-10-CM

## 2021-01-26 DIAGNOSIS — Z1329 Encounter for screening for other suspected endocrine disorder: Secondary | ICD-10-CM

## 2021-01-26 DIAGNOSIS — Z136 Encounter for screening for cardiovascular disorders: Secondary | ICD-10-CM

## 2021-01-27 LAB — CMP14+EGFR
ALT: 17 IU/L (ref 0–32)
AST: 16 IU/L (ref 0–40)
Albumin/Globulin Ratio: 1.6 (ref 1.2–2.2)
Albumin: 4.2 g/dL (ref 3.8–4.9)
Alkaline Phosphatase: 109 IU/L (ref 44–121)
BUN/Creatinine Ratio: 25 — ABNORMAL HIGH (ref 9–23)
BUN: 18 mg/dL (ref 6–24)
Bilirubin Total: 0.3 mg/dL (ref 0.0–1.2)
CO2: 22 mmol/L (ref 20–29)
Calcium: 9.4 mg/dL (ref 8.7–10.2)
Chloride: 102 mmol/L (ref 96–106)
Creatinine, Ser: 0.72 mg/dL (ref 0.57–1.00)
Globulin, Total: 2.6 g/dL (ref 1.5–4.5)
Glucose: 107 mg/dL — ABNORMAL HIGH (ref 70–99)
Potassium: 4.5 mmol/L (ref 3.5–5.2)
Sodium: 140 mmol/L (ref 134–144)
Total Protein: 6.8 g/dL (ref 6.0–8.5)
eGFR: 101 mL/min/{1.73_m2} (ref >59)

## 2021-01-27 LAB — LIPID PANEL
Chol/HDL Ratio: 4.3 ratio (ref 0.0–4.4)
Cholesterol, Total: 234 mg/dL — ABNORMAL HIGH (ref 100–199)
HDL: 54 mg/dL (ref >39)
LDL Chol Calc (NIH): 150 mg/dL — ABNORMAL HIGH (ref 0–99)
Triglycerides: 165 mg/dL — ABNORMAL HIGH (ref 0–149)
VLDL Cholesterol Cal: 30 mg/dL (ref 5–40)

## 2021-01-27 LAB — TSH: TSH: 1.3 u[IU]/mL (ref 0.450–4.500)

## 2021-02-02 ENCOUNTER — Encounter: Payer: Self-pay | Admitting: Internal Medicine

## 2021-02-02 ENCOUNTER — Other Ambulatory Visit: Payer: Self-pay

## 2021-02-02 ENCOUNTER — Ambulatory Visit (INDEPENDENT_AMBULATORY_CARE_PROVIDER_SITE_OTHER): Payer: 59 | Admitting: Internal Medicine

## 2021-02-02 VITALS — BP 119/78 | HR 73 | Temp 97.8°F | Ht 68.9 in | Wt 240.8 lb

## 2021-02-02 DIAGNOSIS — F988 Other specified behavioral and emotional disorders with onset usually occurring in childhood and adolescence: Secondary | ICD-10-CM | POA: Diagnosis not present

## 2021-02-02 DIAGNOSIS — H539 Unspecified visual disturbance: Secondary | ICD-10-CM | POA: Insufficient documentation

## 2021-02-02 DIAGNOSIS — Z6835 Body mass index (BMI) 35.0-35.9, adult: Secondary | ICD-10-CM | POA: Insufficient documentation

## 2021-02-02 DIAGNOSIS — E781 Pure hyperglyceridemia: Secondary | ICD-10-CM | POA: Insufficient documentation

## 2021-02-02 DIAGNOSIS — Z23 Encounter for immunization: Secondary | ICD-10-CM | POA: Diagnosis not present

## 2021-02-02 HISTORY — DX: Other specified behavioral and emotional disorders with onset usually occurring in childhood and adolescence: F98.8

## 2021-02-02 MED ORDER — AMPHETAMINE-DEXTROAMPHETAMINE 20 MG PO TABS: 20.0000 mg | ORAL_TABLET | Freq: Two times a day (BID) | ORAL | 0 refills | Status: DC

## 2021-02-02 NOTE — Progress Notes (Signed)
BP 119/78   Pulse 73   Temp 97.8 F (36.6 C) (Oral)   Ht 5' 8.9" (1.75 m)   Wt 240 lb 12.8 oz (109.2 kg)   SpO2 98%   BMI 35.67 kg/m    Subjective:    Patient ID: Cassandra Robbins, female    DOB: Jan 28, 1971, 50 y.o.   MRN: [id number]  Chief Complaint  Patient presents with   Herpes Zoster    Patient states that her shingles have gone and has stopped taking the Lyrica     HPI: Cassandra Robbins is a 50 y.o. female  Pts here for a fu , says she has been stressed with daughters seizures - had syncopal episodes heart rate will increase @ exercise sees [facility] dysautonomia specialist / cardiology as well. Had a tilt table test and found this.  Spent a year  Pt has prediabetes , has gained weight as well. Is a stress eater has been in hosp with sister who has been diagnosed with cancer.  Pt has ADD as well is on adderrall for  such needs a refill  Off of lyrica for shingles , much improved now.    Hyperlipidemia This is a chronic problem. The problem is uncontrolled. Pertinent negatives include no chest pain or shortness of breath.   Chief Complaint  Patient presents with   Herpes Zoster    Patient states that her shingles have gone and has stopped taking the Lyrica     Relevant past medical, surgical, family and social history reviewed and updated as indicated. Interim medical history since our last visit reviewed. Allergies and medications reviewed and updated.  Review of Systems  Constitutional:  Negative for activity change, appetite change, chills, fatigue and fever.  HENT:  Negative for congestion.   Eyes:  Negative for visual disturbance.  Respiratory:  Negative for apnea, cough, chest tightness, shortness of breath and wheezing.   Cardiovascular:  Negative for chest pain, palpitations and leg swelling.  Gastrointestinal:  Negative for abdominal distention, abdominal pain, diarrhea and nausea.  Endocrine: Negative for cold intolerance, heat intolerance,  polydipsia, polyphagia and polyuria.  Genitourinary:  Negative for difficulty urinating, frequency, hematuria and urgency.  Skin:  Negative for color change and rash.  Neurological:  Negative for dizziness, speech difficulty, weakness, light-headedness, numbness and headaches.  Psychiatric/Behavioral:  Negative for behavioral problems and confusion. The patient is not nervous/anxious.    Per HPI unless specifically indicated above     Objective:    BP 119/78   Pulse 73   Temp 97.8 F (36.6 C) (Oral)   Ht 5' 8.9" (1.75 m)   Wt 240 lb 12.8 oz (109.2 kg)   SpO2 98%   BMI 35.67 kg/m   Wt Readings from Last 3 Encounters:  02/02/21 240 lb 12.8 oz (109.2 kg)  11/30/20 241 lb 6.4 oz (109.5 kg)  11/16/20 230 lb (104.3 kg)    Physical Exam Vitals and nursing note reviewed.  Constitutional:      General: She is not in acute distress.    Appearance: Normal appearance. She is not ill-appearing or diaphoretic.  HENT:     Head: Normocephalic and atraumatic.     Right Ear: Tympanic membrane and external ear normal. There is no impacted cerumen.     Left Ear: External ear normal.     Nose: No congestion or rhinorrhea.     Mouth/Throat:     Pharynx: No oropharyngeal exudate or posterior oropharyngeal erythema.  Eyes:  Conjunctiva/sclera: Conjunctivae normal.     Pupils: Pupils are equal, round, and reactive to light.  Cardiovascular:     Rate and Rhythm: Normal rate and regular rhythm.     Heart sounds: No murmur heard.   No friction rub. No gallop.  Pulmonary:     Effort: No respiratory distress.     Breath sounds: No stridor. No wheezing or rhonchi.  Chest:     Chest wall: No tenderness.  Abdominal:     General: Abdomen is flat. Bowel sounds are normal. There is no distension.     Palpations: Abdomen is soft. There is no mass.     Tenderness: There is no abdominal tenderness. There is no guarding.  Musculoskeletal:        General: No swelling or deformity.     Cervical back:  Normal range of motion and neck supple. No rigidity or tenderness.     Right lower leg: No edema.     Left lower leg: No edema.  Skin:    General: Skin is warm and dry.     Coloration: Skin is not jaundiced.     Findings: No erythema.  Neurological:     Mental Status: She is alert and oriented to person, place, and time. Mental status is at baseline.  Psychiatric:        Mood and Affect: Mood normal.        Behavior: Behavior normal.        Thought Content: Thought content normal.        Judgment: Judgment normal.    Results for orders placed or performed in visit on 01/26/21  CMP14+EGFR  Result Value Ref Range   Glucose 107 (H) 70 - 99 mg/dL   BUN 18 6 - 24 mg/dL   Creatinine, Ser 0.72 0.57 - 1.00 mg/dL   eGFR 101 >59 mL/min/1.73   BUN/Creatinine Ratio 25 (H) 9 - 23   Sodium 140 134 - 144 mmol/L   Potassium 4.5 3.5 - 5.2 mmol/L   Chloride 102 96 - 106 mmol/L   CO2 22 20 - 29 mmol/L   Calcium 9.4 8.7 - 10.2 mg/dL   Total Protein 6.8 6.0 - 8.5 g/dL   Albumin 4.2 3.8 - 4.9 g/dL   Globulin, Total 2.6 1.5 - 4.5 g/dL   Albumin/Globulin Ratio 1.6 1.2 - 2.2   Bilirubin Total 0.3 0.0 - 1.2 mg/dL   Alkaline Phosphatase 109 44 - 121 IU/L   AST 16 0 - 40 IU/L   ALT 17 0 - 32 IU/L  Lipid panel  Result Value Ref Range   Cholesterol, Total 234 (H) 100 - 199 mg/dL   Triglycerides 165 (H) 0 - 149 mg/dL   HDL 54 >39 mg/dL   VLDL Cholesterol Cal 30 5 - 40 mg/dL   LDL Chol Calc (NIH) 150 (H) 0 - 99 mg/dL   Chol/HDL Ratio 4.3 0.0 - 4.4 ratio  TSH  Result Value Ref Range   TSH 1.300 0.450 - 4.500 uIU/mL        Current Outpatient Medications:    amphetamine-dextroamphetamine (ADDERALL) 20 MG tablet, Take 1 tablet (20 mg total) by mouth 2 (two) times daily., Disp: 60 tablet, Rfl: 0   fluticasone (FLONASE) 50 MCG/ACT nasal spray, Place 2 sprays into both nostrils daily., Disp: 16 g, Rfl: 11   Results for MELENDA, BIELAK (MRN [id number]) as of 02/02/2021 08:58  Ref. Range 01/26/2021  09:37  Sodium Latest Ref Range: 134 - 144 mmol/L 140  Potassium Latest Ref Range: 3.5 - 5.2 mmol/L 4.5  Chloride Latest Ref Range: 96 - 106 mmol/L 102  CO2 Latest Ref Range: 20 - 29 mmol/L 22  Glucose Latest Ref Range: 70 - 99 mg/dL 107 (H)  BUN Latest Ref Range: 6 - 24 mg/dL 18  Creatinine Latest Ref Range: 0.57 - 1.00 mg/dL 0.72  Calcium Latest Ref Range: 8.7 - 10.2 mg/dL 9.4  BUN/Creatinine Ratio Latest Ref Range: 9 - 23  25 (H)  eGFR Latest Ref Range: >59 mL/min/1.73 101  Alkaline Phosphatase Latest Ref Range: 44 - 121 IU/L 109  Albumin Latest Ref Range: 3.8 - 4.9 g/dL 4.2  Albumin/Globulin Ratio Latest Ref Range: 1.2 - 2.2  1.6  AST Latest Ref Range: 0 - 40 IU/L 16  ALT Latest Ref Range: 0 - 32 IU/L 17  Total Protein Latest Ref Range: 6.0 - 8.5 g/dL 6.8  Total Bilirubin Latest Ref Range: 0.0 - 1.2 mg/dL 0.3  Total CHOL/HDL Ratio Latest Ref Range: 0.0 - 4.4 ratio 4.3  Cholesterol, Total Latest Ref Range: 100 - 199 mg/dL 234 (H)  HDL Cholesterol Latest Ref Range: >39 mg/dL 54  Triglycerides Latest Ref Range: 0 - 149 mg/dL 165 (H)  VLDL Cholesterol Cal Latest Ref Range: 5 - 40 mg/dL 30  LDL Chol Calc (NIH) Latest Ref Range: 0 - 99 mg/dL 150 (H)  Globulin, Total Latest Ref Range: 1.5 - 4.5 g/dL 2.6  TSH Latest Ref Range: 0.450 - 4.500 uIU/mL 1.300   Assessment & Plan:  HLD TG high  check LFT's work on diet,  low fat and high fiber diet explained to pt. Lifestyle modifications advised to pt.  Portion control and avoiding high carb low fat diet advised.  Diet plan given to pt   exercise plan given and encouraged.  To increase exercise to 150 mins a week ie 21/2 hours a week. Pt verbalises understanding of the above.     Ref. Range 04/15/2018 11:05 11/16/2020 11:43 11/30/2020 11:22 11/30/2020 11:26 01/26/2021 09:37  Total CHOL/HDL Ratio Latest Ref Range: 0.0 - 4.4 ratio 3.9    4.3  Cholesterol, Total Latest Ref Range: 100 - 199 mg/dL 228 (H)    234 (H)  HDL Cholesterol Latest Ref Range:  >39 mg/dL 58    54  LDL (calc) Latest Ref Range: 0 - 99 mg/dL 145 (H)      Triglycerides Latest Ref Range: 0 - 149 mg/dL 127    165 (H)  VLDL Cholesterol Cal Latest Ref Range: 5 - 40 mg/dL 25    30  LDL Chol Calc (NIH) Latest Ref Range: 0 - 99 mg/dL     150 (H)     2. ADD on adderall for such bid  adderral #60 refill ok pt will see me every 3 months  3. Visual changes/ worse at night Will refer to ophthalmology , pt to get visual insurance   Problem List Items Addressed This Visit   None Visit Diagnoses     Need for influenza vaccination    -  Primary   Relevant Orders   Flu Vaccine QUAD 18mo+IM (Fluarix, Fluzone & Alfiuria Quad PF) (Completed)        Orders Placed This Encounter  Procedures   Flu Vaccine QUAD 26mo+IM (Fluarix, Fluzone & Alfiuria Quad PF)     No orders of the defined types were placed in this encounter.    Follow up plan: No follow-ups on file.

## 2021-10-02 IMAGING — CT CT ABD-PELV W/O CM
1 of 2 series · 14 of 32 positions shown, 18 images · non-contrast
Comparison: None.
COMPARISON: None.

Addendum:
CLINICAL DATA: Flank pain, kidney stone suspected

EXAM:
CT ABDOMEN AND PELVIS WITHOUT CONTRAST
TECHNIQUE: Multidetector CT imaging of the abdomen and pelvis was performed
following the standard protocol without IV contrast.

[Series 2: axial st · axial · 0.75mm/px · z∈[-1035,-565]mm · 14 of 104 slices shown, 18 images]
[im 5/104  soft-tissue]
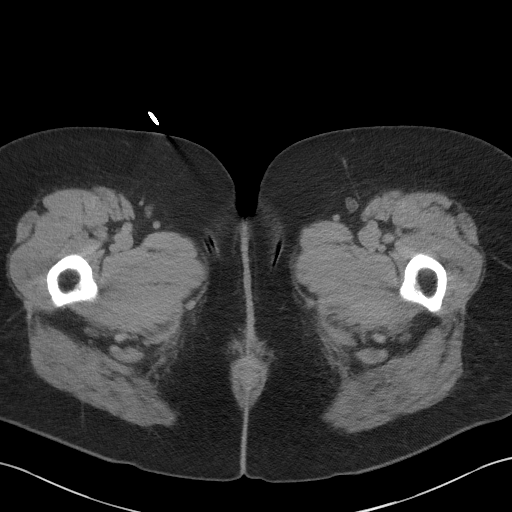
[im 5/104  bone]
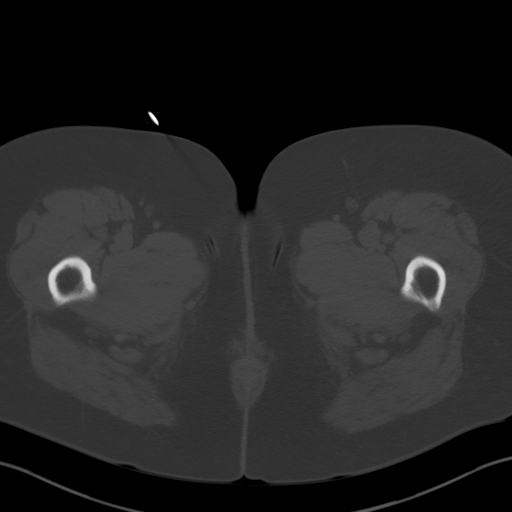
[im 14/104  soft-tissue]
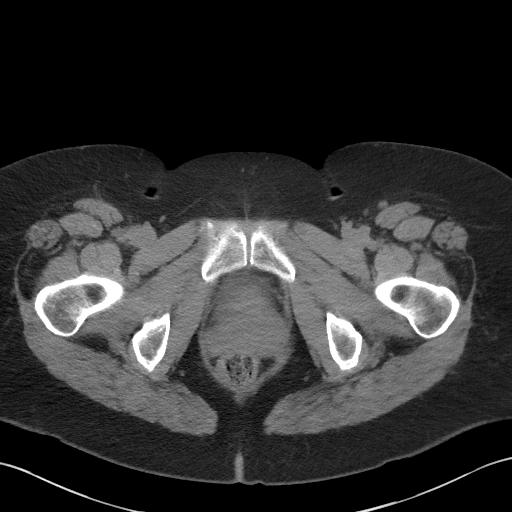
[im 23/104  soft-tissue]
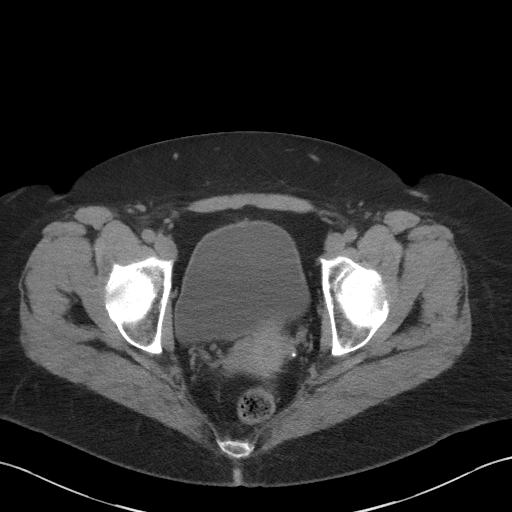
[im 32/104  soft-tissue]
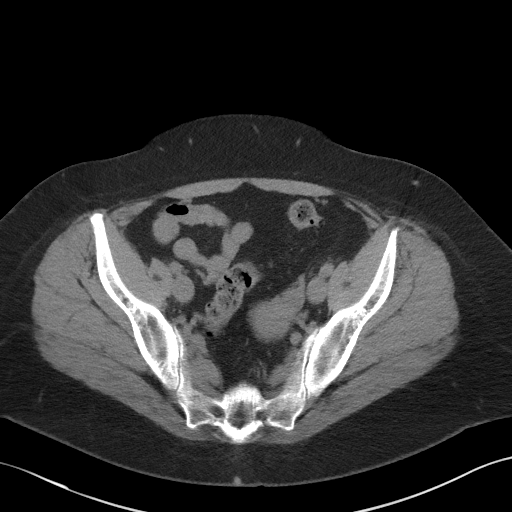
[im 41/104  soft-tissue]
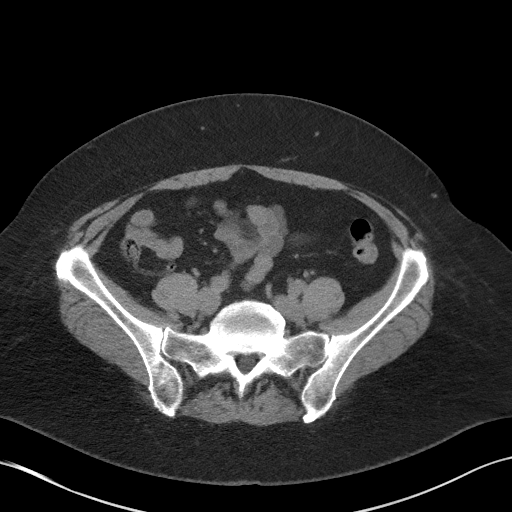
[im 50/104  soft-tissue]
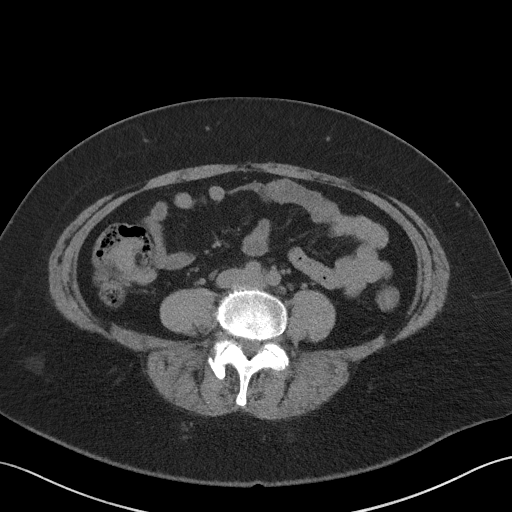
[im 54/104  soft-tissue]
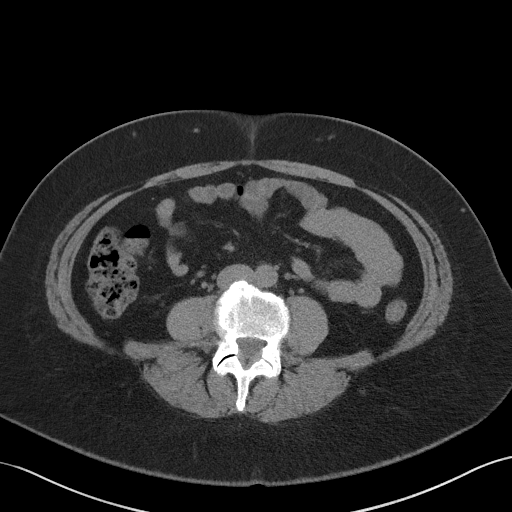
[im 63/104  soft-tissue]
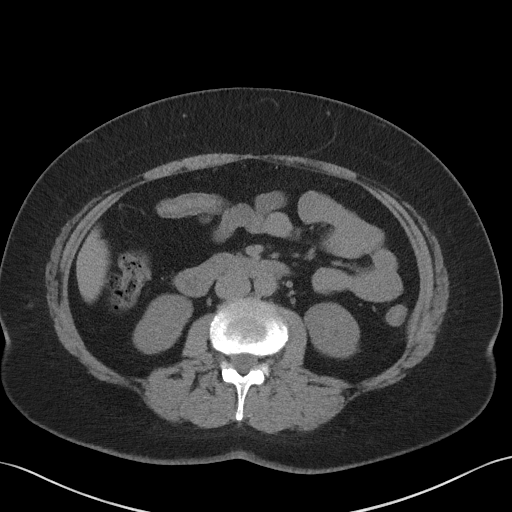
[im 72/104  soft-tissue]
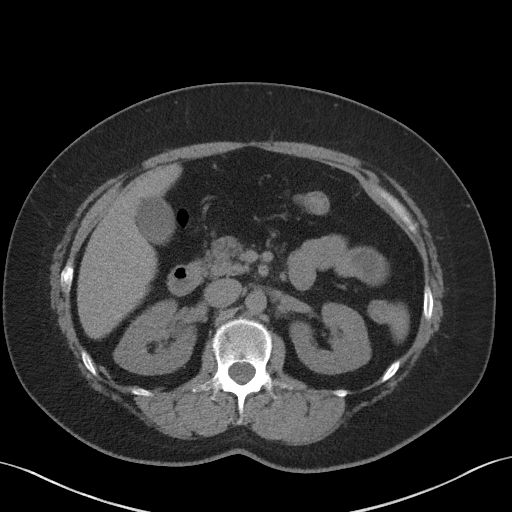
[im 72/104  bone]
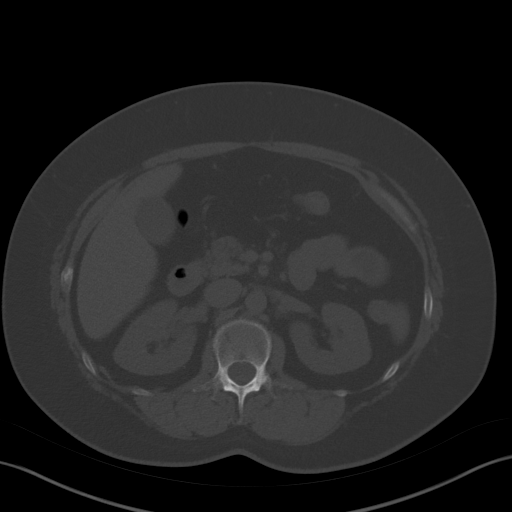
[im 81/104  soft-tissue]
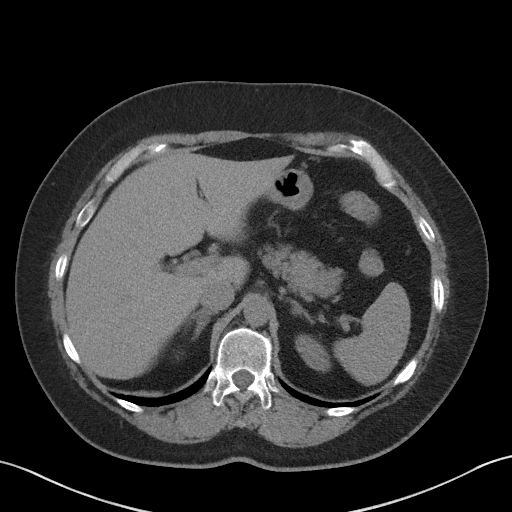
[im 86/104  lung]
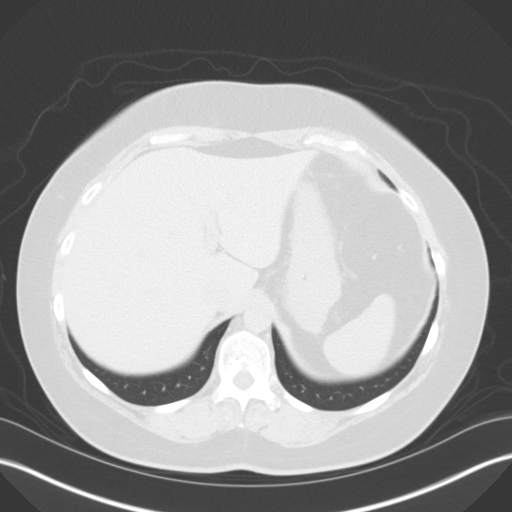
[im 90/104  soft-tissue]
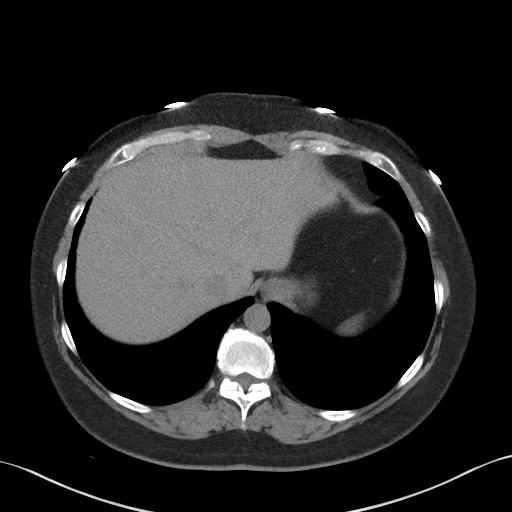
[im 90/104  lung]
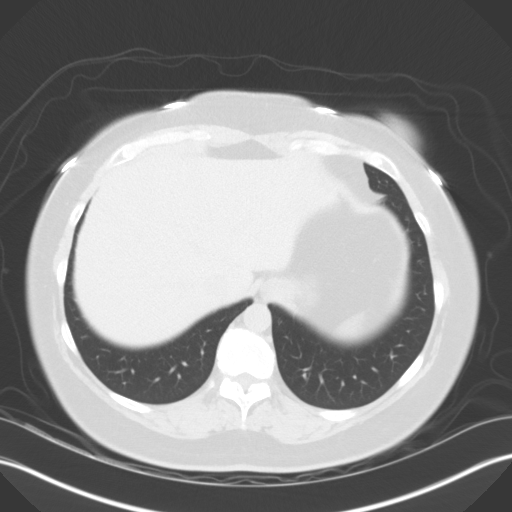
[im 95/104  lung]
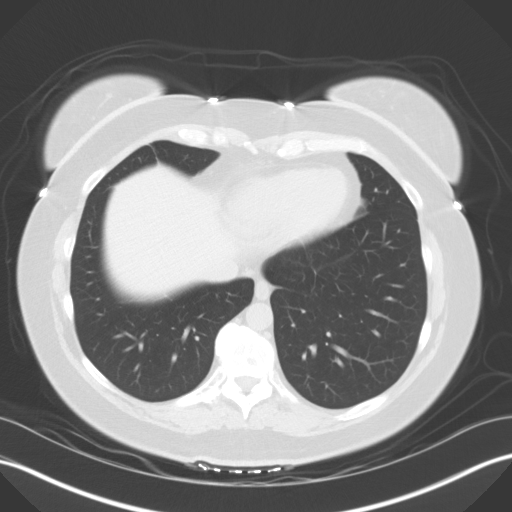
[im 99/104  soft-tissue]
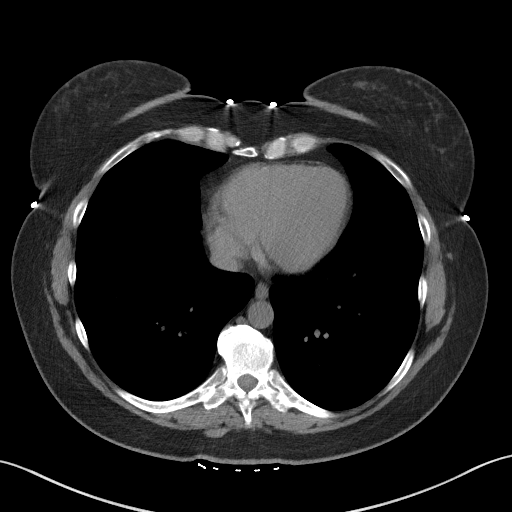
[im 99/104  lung]
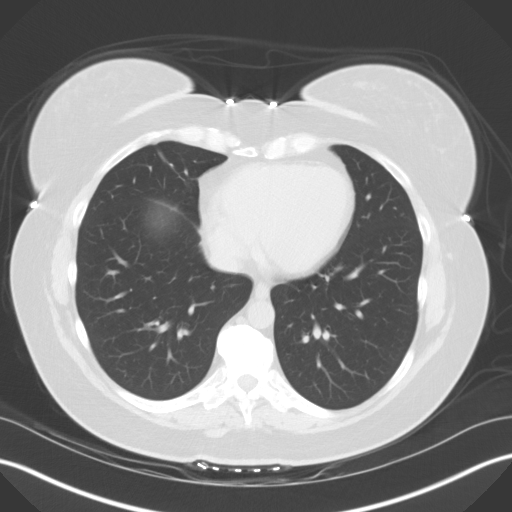

[14 of 32 positions shown; findings below may reference images not displayed]

FINDINGS: Lower chest: Right middle lobe scarring/linear atelectasis. No acute
abnormality.

Hepatobiliary: No focal liver abnormality is seen. No gallstones,
gallbladder wall thickening, or biliary dilatation.

Pancreas: Unremarkable. No pancreatic ductal dilatation or
surrounding inflammatory changes.

Spleen: Normal in size without focal abnormality.

Adrenals/Urinary Tract: There is a 1.6 cm right adrenal low-density
nodule with less than 10 Hounsfield units, statistically likely to
be an adenoma. Normal left adrenal gland. No hydronephrosis. There
is a 2 mm nonobstructive stone in the right mid kidney. There is no
ureterolithiasis. Bladder is unremarkable.

Stomach/Bowel: Stomach is within normal limits. There is no evidence
of bowel obstruction. Appendix is normal. Scattered colonic
diverticuli. No evidence of diverticulitis.

Vascular/Lymphatic: No significant vascular findings are present. No
enlarged abdominal or pelvic lymph nodes.

Reproductive: Unremarkable.

Other: No abdominal wall hernia or abnormality. No abdominopelvic
ascites.

Musculoskeletal: No acute osseous abnormality. No suspicious lytic
or blastic lesions. Multilevel degenerative changes of the spine.
IMPRESSION: No evidence of obstructive uropathy. Nonobstructive 2 mm stone in
the right mid kidney.

No other acute findings in the abdomen or pelvis.

ADDENDUM:
Incidental finding included in the findings but not in the initial
IMPRESSION: Incidental 1.6 cm right adrenal nodule with density characteristics
compatible with an adenoma. Correlate with biochemical profile and
recommend follow-up CT in 12 months.

*** End of Addendum ***
FINDINGS: Lower chest: Right middle lobe scarring/linear atelectasis. No acute
abnormality.

Hepatobiliary: No focal liver abnormality is seen. No gallstones,
gallbladder wall thickening, or biliary dilatation.

Pancreas: Unremarkable. No pancreatic ductal dilatation or
surrounding inflammatory changes.

Spleen: Normal in size without focal abnormality.

Adrenals/Urinary Tract: There is a 1.6 cm right adrenal low-density
nodule with less than 10 Hounsfield units, statistically likely to
be an adenoma. Normal left adrenal gland. No hydronephrosis. There
is a 2 mm nonobstructive stone in the right mid kidney. There is no
ureterolithiasis. Bladder is unremarkable.

Stomach/Bowel: Stomach is within normal limits. There is no evidence
of bowel obstruction. Appendix is normal. Scattered colonic
diverticuli. No evidence of diverticulitis.

Vascular/Lymphatic: No significant vascular findings are present. No
enlarged abdominal or pelvic lymph nodes.

Reproductive: Unremarkable.

Other: No abdominal wall hernia or abnormality. No abdominopelvic
ascites.

Musculoskeletal: No acute osseous abnormality. No suspicious lytic
or blastic lesions. Multilevel degenerative changes of the spine.
IMPRESSION: No evidence of obstructive uropathy. Nonobstructive 2 mm stone in
the right mid kidney.

No other acute findings in the abdomen or pelvis.

## 2021-12-28 ENCOUNTER — Telehealth: Payer: Self-pay

## 2021-12-28 DIAGNOSIS — Z1231 Encounter for screening mammogram for malignant neoplasm of breast: Secondary | ICD-10-CM

## 2021-12-28 NOTE — Telephone Encounter (Signed)
Called to order and scheduled mamm. Appt 01/23/22 at 1:40pm

## 2021-12-29 ENCOUNTER — Other Ambulatory Visit: Payer: Self-pay | Admitting: Internal Medicine

## 2021-12-29 NOTE — Telephone Encounter (Signed)
LOV 02/02/21  Future appt 01/08/22

## 2022-01-08 ENCOUNTER — Other Ambulatory Visit (HOSPITAL_COMMUNITY)
Admission: RE | Admit: 2022-01-08 | Discharge: 2022-01-08 | Disposition: A | Discharge status: Home / Self Care | Payer: 59 | Source: Ambulatory Visit | Attending: Physician Assistant | Admitting: Physician Assistant

## 2022-01-08 ENCOUNTER — Encounter: Payer: Self-pay | Admitting: Physician Assistant

## 2022-01-08 ENCOUNTER — Ambulatory Visit (INDEPENDENT_AMBULATORY_CARE_PROVIDER_SITE_OTHER): Payer: 59 | Admitting: Physician Assistant

## 2022-01-08 VITALS — BP 102/71 | HR 75 | Temp 98.5°F | Ht 68.9 in | Wt 249.2 lb

## 2022-01-08 DIAGNOSIS — Z1159 Encounter for screening for other viral diseases: Secondary | ICD-10-CM | POA: Diagnosis not present

## 2022-01-08 DIAGNOSIS — Z1211 Encounter for screening for malignant neoplasm of colon: Secondary | ICD-10-CM

## 2022-01-08 DIAGNOSIS — Z136 Encounter for screening for cardiovascular disorders: Secondary | ICD-10-CM

## 2022-01-08 DIAGNOSIS — F909 Attention-deficit hyperactivity disorder, unspecified type: Secondary | ICD-10-CM

## 2022-01-08 DIAGNOSIS — Z0000 Encounter for general adult medical examination without abnormal findings: Secondary | ICD-10-CM | POA: Diagnosis not present

## 2022-01-08 DIAGNOSIS — H9011 Conductive hearing loss, unilateral, right ear, with unrestricted hearing on the contralateral side: Secondary | ICD-10-CM | POA: Insufficient documentation

## 2022-01-08 DIAGNOSIS — Z23 Encounter for immunization: Secondary | ICD-10-CM

## 2022-01-08 DIAGNOSIS — Z124 Encounter for screening for malignant neoplasm of cervix: Secondary | ICD-10-CM

## 2022-01-08 DIAGNOSIS — Z131 Encounter for screening for diabetes mellitus: Secondary | ICD-10-CM | POA: Diagnosis not present

## 2022-01-08 MED ORDER — AMPHETAMINE-DEXTROAMPHETAMINE 20 MG PO TABS: 20.0000 mg | ORAL_TABLET | Freq: Two times a day (BID) | ORAL | 0 refills | Status: DC

## 2022-01-08 NOTE — Progress Notes (Signed)
Annual Physical Exam   Name: Cassandra Robbins   MRN: [id number]    DOB: [birth year]   Date:01/08/2022 Today's Provider: Talitha Givens, MHS, PA-C Introduced myself to the patient as a PA-C and provided education on APPs in clinical practice.          Subjective  Chief Complaint  Chief Complaint  Patient presents with   Annual Exam    HPI  Patient presents for annual CPE.  Diet: Reports she eats while she is stressed.  Exercise: Not engaged in regular exercise at this time.   Sleep: "not good"  Mood:having difficulty with daughter's health concerns and family issues   She would like her Adderall refilled today.  Reports improvement in wake-sleep cycle while taking it    Depression: Phq 9 is  positive    01/08/2022   10:07 AM 02/02/2021    8:39 AM 11/30/2020   10:43 AM 04/15/2018   10:16 AM 10/08/2017   10:52 AM  Depression screen PHQ 2/9  Decreased Interest 3 0 0 3 0  Down, Depressed, Hopeless 3 0 0 3 0  PHQ - 2 Score 6 0 0 6 0  Altered sleeping 2   3   Tired, decreased energy 3   3   Change in appetite 2   3   Feeling bad or failure about yourself  0   3   Trouble concentrating 2   1   Moving slowly or fidgety/restless 1   0   Suicidal thoughts 0   0   PHQ-9 Score 16   19   Difficult doing work/chores Somewhat difficult   Somewhat difficult    Hypertension: BP Readings from Last 3 Encounters:  01/08/22 102/71  02/02/21 119/78  11/30/20 139/90   Obesity: Wt Readings from Last 3 Encounters:  01/08/22 249 lb 3.2 oz (113 kg)  02/02/21 240 lb 12.8 oz (109.2 kg)  11/30/20 241 lb 6.4 oz (109.5 kg)   BMI Readings from Last 3 Encounters:  01/08/22 36.91 kg/m  02/02/21 35.67 kg/m  11/30/20 35.75 kg/m     Vaccines:   HPV: Aged out  Tdap: Up to date, next due in 2027 Shingrix: Due- discussed importance of this for prevention  Pneumonia: NA  Flu: Due in the next few weeks COVID-19: Has received 2 vaccines thus far.    Hep C Screening: ordered  today  STD testing and prevention (HIV/chl/gon/syphilis): no concerns  Intimate partner violence: negative Sexual History : Menstrual History/LMP/Abnormal Bleeding: having irregular menses, last period was in July, states she is having hot flashes  Discussed importance of follow up if any post-menopausal bleeding: not applicable Incontinence Symptoms: No.  Breast cancer:  - Last Mammogram: Scheduled for this month  - BRCA gene screening:   Osteoporosis Prevention : Discussed high calcium and vitamin D supplementation, weight bearing exercises Bone density :not applicable  Cervical cancer screening: Completed today   Skin cancer: Discussed monitoring for atypical lesions  Colorectal cancer: discussed colonoscopy for screening. Order placed for GI referral  Lung cancer:  Low Dose CT Chest recommended if Age 75-80 years, 20 pack-year currently smoking OR have quit w/in 15years. Patient does not qualify.   ECG: NA  Advanced Care Planning: A voluntary discussion about advance care planning including the explanation and discussion of advance directives.  Discussed health care proxy and Living will, and the patient was able to identify a health care proxy as no one.  Patient does have a living will in effect.   Lipids: Lab Results  Component Value Date   CHOL 234 (H) 01/26/2021   CHOL 228 (H) 04/15/2018   CHOL 201 (H) 03/18/2017   Lab Results  Component Value Date   HDL 54 01/26/2021   HDL 58 04/15/2018   HDL 56 03/18/2017   Lab Results  Component Value Date   LDLCALC 150 (H) 01/26/2021   LDLCALC 145 (H) 04/15/2018   LDLCALC 129 (H) 03/18/2017   Lab Results  Component Value Date   TRIG 165 (H) 01/26/2021   TRIG 127 04/15/2018   TRIG 80 03/18/2017   Lab Results  Component Value Date   CHOLHDL 4.3 01/26/2021   CHOLHDL 3.9 04/15/2018   CHOLHDL 3.6 03/18/2017   No results found for: "LDLDIRECT"  Glucose: Glucose  Date Value Ref Range Status  01/26/2021 107 (H) 70 - 99  mg/dL Final    Comment:                  **Please note reference interval change**  11/30/2020 93 65 - 99 mg/dL Final  44/16/8358 96 65 - 99 mg/dL Final   Glucose, Bld  Date Value Ref Range Status  11/16/2020 102 (H) 70 - 99 mg/dL Final    Comment:    Glucose reference range applies only to samples taken after fasting for at least 8 hours.    Patient Active Problem List   Diagnosis Date Noted   Conductive hearing loss in right ear 01/08/2022   Annual physical exam 01/08/2022   Visual changes 02/02/2021   Attention deficit disorder 02/02/2021   BMI 35.0-35.9,adult 02/02/2021   Hypertriglyceridemia 02/02/2021   Need for influenza vaccination 02/02/2021   COVID 03/21/2020   GAD (generalized anxiety disorder) 05/10/2019   Weight gain 05/10/2019   Recurrent major depressive disorder, in partial remission (HCC) 10/16/2018   Toe fracture, right 10/08/2017   Adult ADHD 09/07/2015    Past Surgical History:  Procedure Laterality Date   LAPAROSCOPIC ENDOMETRIOSIS FULGURATION  2007   SEPTOPLASTY  2005   TUBAL LIGATION      Family History  Problem Relation Age of Onset   Asthma Mother    Lung disease Mother    Thyroid disease Mother    Cancer Father 29    Social History   Socioeconomic History   Marital status: Divorced    Spouse name: Not on file   Number of children: Not on file   Years of education: Not on file   Highest education level: Not on file  Occupational History   Occupation: paramedic  Tobacco Use   Smoking status: Never   Smokeless tobacco: Never  Vaping Use   Vaping Use: Never used  Substance and Sexual Activity   Alcohol use: Yes    Comment: on occasion   Drug use: No   Sexual activity: Yes  Other Topics Concern   Not on file  Social History Narrative   She has been a paramedic for 32 years.     Social Determinants of Health   Financial Resource Strain: Not on file  Food Insecurity: Not on file  Transportation Needs: Not on file  Physical  Activity: Not on file  Stress: Not on file  Social Connections: Not on file  Intimate Partner Violence: Not on file     Current Outpatient Medications:    fluticasone (FLONASE) 50 MCG/ACT nasal spray, Place 2 sprays into both nostrils daily., Disp: 16 g, Rfl: 11  amphetamine-dextroamphetamine (ADDERALL) 20 MG tablet, Take 1 tablet (20 mg total) by mouth 2 (two) times daily., Disp: 60 tablet, Rfl: 0  Allergies  Allergen Reactions   Penicillins Anaphylaxis   Amoxicillin Rash     Review of Systems  Constitutional:  Positive for malaise/fatigue. Negative for chills, fever and weight loss.  HENT:  Positive for hearing loss (bilateral conductive). Negative for congestion, sore throat and tinnitus.   Eyes:  Negative for blurred vision, double vision and photophobia.  Respiratory:  Negative for cough, shortness of breath and wheezing.   Cardiovascular:  Positive for leg swelling. Negative for chest pain and palpitations.  Gastrointestinal:  Negative for blood in stool, constipation, diarrhea, heartburn, nausea and vomiting.  Genitourinary:  Negative for dysuria, frequency, hematuria and urgency.  Musculoskeletal:  Negative for falls, joint pain and myalgias.  Skin:  Negative for itching and rash.  Neurological:  Positive for headaches. Negative for dizziness, tingling, tremors, seizures, loss of consciousness and weakness.  Psychiatric/Behavioral:  Positive for depression.       Objective  Vitals:   01/08/22 1005  BP: 102/71  Pulse: 75  Temp: 98.5 F (36.9 C)  TempSrc: Oral  SpO2: 98%  Weight: 249 lb 3.2 oz (113 kg)  Height: 5' 8.9" (1.75 m)    Body mass index is 36.91 kg/m.  Physical Exam Vitals reviewed. Exam conducted with a chaperone present.  Constitutional:      General: She is awake.     Appearance: Normal appearance. She is well-developed, well-groomed and overweight.  HENT:     Head: Normocephalic and atraumatic.     Right Ear: Tympanic membrane, ear canal  and external ear normal. Decreased hearing noted.     Left Ear: Tympanic membrane, ear canal and external ear normal. Decreased hearing noted.     Mouth/Throat:     Lips: Pink.     Dentition: Normal dentition. Does not have dentures. No gingival swelling.     Pharynx: Oropharynx is clear. Uvula midline. No pharyngeal swelling, oropharyngeal exudate, posterior oropharyngeal erythema or uvula swelling.  Eyes:     General: Lids are normal. Gaze aligned appropriately.     Extraocular Movements: Extraocular movements intact.     Conjunctiva/sclera: Conjunctivae normal.     Pupils: Pupils are equal, round, and reactive to light.  Neck:     Thyroid: No thyroid mass, thyromegaly or thyroid tenderness.  Cardiovascular:     Rate and Rhythm: Normal rate and regular rhythm.     Pulses: Normal pulses.          Radial pulses are 2+ on the right side and 2+ on the left side.       Dorsalis pedis pulses are 2+ on the right side and 2+ on the left side.     Heart sounds: Normal heart sounds. No murmur heard.    No friction rub. No gallop.  Pulmonary:     Effort: Pulmonary effort is normal.     Breath sounds: Normal breath sounds. No decreased air movement. No decreased breath sounds, wheezing, rhonchi or rales.  Abdominal:     General: Abdomen is flat. Bowel sounds are normal.     Palpations: Abdomen is soft.     Tenderness: There is no abdominal tenderness.  Genitourinary:    General: Normal vulva.     Exam position: Lithotomy position.     Pubic Area: No rash or pubic lice.      Tanner stage (genital): 5.     Vagina: Vaginal  discharge present. No erythema, tenderness or bleeding.     Cervix: Normal.  Musculoskeletal:     Cervical back: Normal range of motion and neck supple. No edema. Normal range of motion.     Right lower leg: No edema.     Left lower leg: No edema.  Lymphadenopathy:     Head:     Right side of head: No submental, submandibular or preauricular adenopathy.     Left side of  head: No submental, submandibular or preauricular adenopathy.     Upper Body:     Right upper body: No supraclavicular adenopathy.     Left upper body: No supraclavicular adenopathy.  Neurological:     General: No focal deficit present.     Mental Status: She is alert and oriented to person, place, and time.     GCS: GCS eye subscore is 4. GCS verbal subscore is 5. GCS motor subscore is 6.     Cranial Nerves: Cranial nerves 2-12 are intact. No cranial nerve deficit, dysarthria or facial asymmetry.     Motor: Motor function is intact. No tremor, atrophy or abnormal muscle tone.     Gait: Gait is intact.  Psychiatric:        Attention and Perception: Attention and perception normal.        Mood and Affect: Mood and affect normal.        Speech: Speech normal.        Behavior: Behavior normal. Behavior is cooperative.        Thought Content: Thought content normal.        Cognition and Memory: Cognition and memory normal.        Judgment: Judgment normal.      No results found for this or any previous visit (from the past 2160 hour(s)).   Fall Risk:    01/08/2022   10:06 AM 02/02/2021    8:39 AM 11/30/2020   10:43 AM 05/08/2019    3:27 PM 04/15/2018   10:16 AM  Fall Risk   Falls in the past year? 0 1 0 0 0  Number falls in past yr: 0 0 0  0  Injury with Fall? 0 0 0  0  Risk for fall due to : No Fall Risks Other (Comment) No Fall Risks    Follow up Falls evaluation completed Falls evaluation completed Falls evaluation completed Falls evaluation completed      Functional Status Survey:     Assessment & Plan  Problem List Items Addressed This Visit       Other   Adult ADHD    Chronic, ongoing condition Patient reports she feels reduced anxiety and racing thoughts with Adderall use in the past PDMP reviewed, no evidence of worrisome controlled substance use patterns at this time  Will provide refill of Adderall 20 mg PO BID for one month Recommend follow up in 4 weeks to  discuss response and evaluate for depression/ anxiety as PHQ9 was elevated today.  Follow up in one month       Relevant Medications   amphetamine-dextroamphetamine (ADDERALL) 20 MG tablet   Annual physical exam - Primary    -USPSTF grade A and B recommendations reviewed with patient; age-appropriate recommendations, preventive care, screening tests, etc discussed and encouraged; healthy living encouraged; see AVS for patient education given to patient -Discussed importance of 150 minutes of physical activity weekly, eat two servings of fish weekly, eat one serving of tree nuts ( cashews, pistachios, pecans, almonds.Marland Kitchen)  every other day, eat 6 servings of fruit/vegetables daily and drink plenty of water and avoid sweet beverages.   -Reviewed Health Maintenance: Yes.        Relevant Orders   Comp Met (CMET)   CBC w/Diff   Other Visit Diagnoses     Screening for ischemic heart disease       Relevant Orders   Lipid Profile   Screening for diabetes mellitus       Relevant Orders   HgB A1c   Encounter for hepatitis C screening test for low risk patient       Relevant Orders   Hepatitis C antibody   Screening for colon cancer       Relevant Orders   Ambulatory referral to Gastroenterology   Screening for cervical cancer       Relevant Orders   Cytology - PAP   Need for shingles vaccine       Relevant Orders   Zoster Recombinant (Shingrix ) (Completed)        Return in about 4 weeks (around 02/05/2022) for Depression.   I, Madyn Ivins E Nechuma Boven, PA-C, have reviewed all documentation for this visit. The documentation on 01/08/22 for the exam, diagnosis, procedures, and orders are all accurate and complete.   Talitha Givens, MHS, PA-C [facility]

## 2022-01-08 NOTE — Assessment & Plan Note (Addendum)
Chronic, ongoing condition Patient reports she feels reduced anxiety and racing thoughts with Adderall use in the past PDMP reviewed, no evidence of worrisome controlled substance use patterns at this time  Will provide refill of Adderall 20 mg PO BID for one month Recommend follow up in 4 weeks to discuss response and evaluate for depression/ anxiety as PHQ9 was elevated today.  Follow up in one month

## 2022-01-08 NOTE — Assessment & Plan Note (Signed)
-  USPSTF grade A and B recommendations reviewed with patient; age-appropriate recommendations, preventive care, screening tests, etc discussed and encouraged; healthy living encouraged; see AVS for patient education given to patient -Discussed importance of 150 minutes of physical activity weekly, eat two servings of fish weekly, eat one serving of tree nuts ( cashews, pistachios, pecans, almonds..) every other day, eat 6 servings of fruit/vegetables daily and drink plenty of water and avoid sweet beverages.   -Reviewed Health Maintenance: Yes.   

## 2022-01-09 LAB — COMPREHENSIVE METABOLIC PANEL
ALT: 21 IU/L (ref 0–32)
AST: 18 IU/L (ref 0–40)
Albumin/Globulin Ratio: 1.6 (ref 1.2–2.2)
Albumin: 4.5 g/dL (ref 3.8–4.9)
Alkaline Phosphatase: 110 IU/L (ref 44–121)
BUN/Creatinine Ratio: 18 (ref 9–23)
BUN: 13 mg/dL (ref 6–24)
Bilirubin Total: 0.4 mg/dL (ref 0.0–1.2)
CO2: 20 mmol/L (ref 20–29)
Calcium: 9.6 mg/dL (ref 8.7–10.2)
Chloride: 103 mmol/L (ref 96–106)
Creatinine, Ser: 0.72 mg/dL (ref 0.57–1.00)
Globulin, Total: 2.8 g/dL (ref 1.5–4.5)
Glucose: 103 mg/dL — ABNORMAL HIGH (ref 70–99)
Potassium: 4.5 mmol/L (ref 3.5–5.2)
Sodium: 140 mmol/L (ref 134–144)
Total Protein: 7.3 g/dL (ref 6.0–8.5)
eGFR: 101 mL/min/{1.73_m2} (ref >59)

## 2022-01-09 LAB — CBC WITH DIFFERENTIAL/PLATELET
Basophils Absolute: 0.1 10*3/uL (ref 0.0–0.2)
Basos: 1 %
EOS (ABSOLUTE): 0.2 10*3/uL (ref 0.0–0.4)
Eos: 2 %
Hematocrit: 43.5 % (ref 34.0–46.6)
Hemoglobin: 14.2 g/dL (ref 11.1–15.9)
Immature Grans (Abs): 0 10*3/uL (ref 0.0–0.1)
Immature Granulocytes: 0 %
Lymphocytes Absolute: 2.2 10*3/uL (ref 0.7–3.1)
Lymphs: 34 %
MCH: 29.3 pg (ref 26.6–33.0)
MCHC: 32.6 g/dL (ref 31.5–35.7)
MCV: 90 fL (ref 79–97)
Monocytes Absolute: 0.4 10*3/uL (ref 0.1–0.9)
Monocytes: 7 %
Neutrophils Absolute: 3.6 10*3/uL (ref 1.4–7.0)
Neutrophils: 56 %
Platelets: 305 10*3/uL (ref 150–450)
RBC: 4.85 x10E6/uL (ref 3.77–5.28)
RDW: 12.8 % (ref 11.7–15.4)
WBC: 6.4 10*3/uL (ref 3.4–10.8)

## 2022-01-09 LAB — HEMOGLOBIN A1C
Est. average glucose Bld gHb Est-mCnc: 128 mg/dL
Hgb A1c MFr Bld: 6.1 % — ABNORMAL HIGH (ref 4.8–5.6)

## 2022-01-09 LAB — LIPID PANEL
Chol/HDL Ratio: 3.9 ratio (ref 0.0–4.4)
Cholesterol, Total: 225 mg/dL — ABNORMAL HIGH (ref 100–199)
HDL: 57 mg/dL (ref >39)
LDL Chol Calc (NIH): 143 mg/dL — ABNORMAL HIGH (ref 0–99)
Triglycerides: 143 mg/dL (ref 0–149)
VLDL Cholesterol Cal: 25 mg/dL (ref 5–40)

## 2022-01-09 LAB — HEPATITIS C ANTIBODY: Hep C Virus Ab: NONREACTIVE

## 2022-01-10 ENCOUNTER — Telehealth: Payer: Self-pay

## 2022-01-10 ENCOUNTER — Other Ambulatory Visit: Payer: Self-pay

## 2022-01-10 DIAGNOSIS — Z1211 Encounter for screening for malignant neoplasm of colon: Secondary | ICD-10-CM

## 2022-01-10 MED ORDER — NA SULFATE-K SULFATE-MG SULF 17.5-3.13-1.6 GM/177ML PO SOLN: 1.0000 | Freq: Once | ORAL | 0 refills | Status: AC

## 2022-01-10 NOTE — Telephone Encounter (Signed)
Gastroenterology Pre-Procedure Review  Request Date: 02/21/2022  Requesting Physician: Dr. Allegra Lai  PATIENT REVIEW QUESTIONS: The patient responded to the following health history questions as indicated:    1. Are you having any GI issues? no 2. Do you have a personal history of Polyps? no 3. Do you have a family history of Colon Cancer or Polyps? no 4. Diabetes Mellitus? no 5. Joint replacements in the past 12 months?no 6. Major health problems in the past 3 months?no 7. Any artificial heart valves, MVP, or defibrillator?no    MEDICATIONS & ALLERGIES:    Patient reports the following regarding taking any anticoagulation/antiplatelet therapy:   Plavix, Coumadin, Eliquis, Xarelto, Lovenox, Pradaxa, Brilinta, or Effient? no Aspirin? no  Patient confirms/reports the following medications:  Current Outpatient Medications  Medication Sig Dispense Refill   amphetamine-dextroamphetamine (ADDERALL) 20 MG tablet Take 1 tablet (20 mg total) by mouth 2 (two) times daily. 60 tablet 0   fluticasone (FLONASE) 50 MCG/ACT nasal spray Place 2 sprays into both nostrils daily. 16 g 11   No current facility-administered medications for this visit.    Patient confirms/reports the following allergies:  Allergies  Allergen Reactions   Penicillins Anaphylaxis   Amoxicillin Rash    No orders of the defined types were placed in this encounter.   AUTHORIZATION INFORMATION Primary Insurance: 1D#: Group #:  Secondary Insurance: 1D#: Group #:  SCHEDULE INFORMATION: Date: 02/21/2022 Time: Location: [facility]

## 2022-01-11 LAB — CYTOLOGY - PAP: Diagnosis: NEGATIVE

## 2022-01-23 ENCOUNTER — Ambulatory Visit
Admission: RE | Admit: 2022-01-23 | Discharge: 2022-01-23 | Disposition: A | Discharge status: Home / Self Care | Payer: 59 | Source: Ambulatory Visit | Attending: Nurse Practitioner | Admitting: Nurse Practitioner

## 2022-01-23 DIAGNOSIS — Z1231 Encounter for screening mammogram for malignant neoplasm of breast: Secondary | ICD-10-CM | POA: Insufficient documentation

## 2022-01-24 NOTE — Progress Notes (Signed)
Contacted via MyChart   Normal mammogram, may repeat in one year:)

## 2022-02-05 ENCOUNTER — Ambulatory Visit: Payer: 59 | Admitting: Physician Assistant

## 2022-02-05 ENCOUNTER — Encounter: Payer: Self-pay | Admitting: Physician Assistant

## 2022-02-05 VITALS — BP 127/86 | HR 76 | Temp 97.6°F | Ht 68.9 in | Wt 246.1 lb

## 2022-02-05 DIAGNOSIS — F331 Major depressive disorder, recurrent, moderate: Secondary | ICD-10-CM

## 2022-02-05 DIAGNOSIS — F909 Attention-deficit hyperactivity disorder, unspecified type: Secondary | ICD-10-CM | POA: Diagnosis not present

## 2022-02-05 DIAGNOSIS — R69 Illness, unspecified: Secondary | ICD-10-CM | POA: Diagnosis not present

## 2022-02-05 MED ORDER — BUPROPION HCL ER (XL) 150 MG PO TB24: 150.0000 mg | ORAL_TABLET | Freq: Every day | ORAL | 1 refills | Status: DC

## 2022-02-05 NOTE — Patient Instructions (Addendum)
I would recommend trying some of the online therapy services:  [facility]com [email or web address]  I would look for ones with subspecialties in PTSD and ADHD  If these do not work out well I can place a referral to therapy services as well.   I would like you to come back in 4-6 weeks to talk about how you are doing on the Wellbutrin and therapy referrals.  If you have concerns prior to that, please call our office.

## 2022-02-05 NOTE — Assessment & Plan Note (Addendum)
Chronic, recurrent, currently exacerbated  Reports depressed mood, sadness, irritability and hypersomnia as major concerns Will try re-implementing Wellbutrin 150 mg PO QD at this time as she reports pretty good response with this previously Discussed therapy and provided resources along with referral today Follow up in 6 weeks to assess response and make adjustments as needed

## 2022-02-05 NOTE — Progress Notes (Signed)
Established Patient Office Visit  Name: Cassandra Robbins   MRN: [id number]    DOB: 1970-07-01   Date:02/05/2022  Today's Provider: Talitha Givens, MHS, PA-C Introduced myself to the patient as a PA-C and provided education on APPs in clinical practice.         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   Mood status: uncontrolled Satisfied with current treatment?:  not on any medication  Symptom severity: moderate  Duration of current treatment :  Not on medication  Side effects: no Medication compliance: excellent compliance Psychotherapy/counseling: no in the past Previous psychiatric medications: wellbutrin- did well on this Depressed mood: yes Anxious mood: yes Anhedonia: yes Significant weight loss or gain: yes Insomnia: no  states she is sleeping too much Fatigue: yes Feelings of worthlessness or guilt: yes Impaired concentration/indecisiveness: yes Suicidal ideations: no - some passive suicidal thoughts, not waking up in the morning. No active intent or plan. Patient denies thoughts of self harm or plan.  Hopelessness: yes Crying spells: yes    02/05/2022    9:12 AM 01/08/2022   10:07 AM 02/02/2021    8:39 AM 11/30/2020   10:43 AM 04/15/2018   10:16 AM  Depression screen PHQ 2/9  Decreased Interest 1 3 0 0 3  Down, Depressed, Hopeless 1 3 0 0 3  PHQ - 2 Score 2 6 0 0 6  Altered  sleeping 2 2   3   Tired, decreased energy 2 3   3   Change in appetite 2 2   3   Feeling bad or failure about yourself  1 0   3  Trouble concentrating 2 2   1   Moving slowly or fidgety/restless 2 1   0  Suicidal thoughts 1 0   0  PHQ-9 Score 14 16   19   Difficult doing work/chores Somewhat difficult Somewhat difficult   Somewhat difficult   ANXIETY/STRESS Duration:exacerbated Anxious mood: no  Excessive worrying: no Irritability: yes  Sweating: yes Nausea: no Palpitations:no Hyperventilation: no Panic attacks: no Agoraphobia: no  Obscessions/compulsions: no Depressed mood: yes     02/05/2022    9:13 AM 01/08/2022   10:09 AM  GAD 7 : Generalized Anxiety Score  Nervous, Anxious, on Edge 1 2  Control/stop worrying 1 0  Worry too much - different things 1 0  Trouble relaxing 1 3  Restless 1 1  Easily annoyed or irritable 2 3  Afraid - awful might happen  0  Total GAD 7 Score  9  Anxiety Difficulty Somewhat difficult Somewhat difficult   Anhedonia: yes Weight changes: yes Insomnia: no  oversleeps.   Hypersomnia: yes Fatigue/loss of energy: yes Feelings of worthlessness: no Feelings of guilt: yes Impaired concentration/indecisiveness: yes Suicidal ideations: no  Crying spells: no Recent Stressors/Life Changes: yes  Relationship problems: no   Family stress: yes     Financial stress: no    Job stress: no    Recent death/loss: yes   Patient Active Problem List   Diagnosis Date Noted   Conductive hearing loss in right ear 01/08/2022   Annual physical exam 01/08/2022   Visual changes 02/02/2021   BMI 35.0-35.9,adult 02/02/2021   Hypertriglyceridemia 02/02/2021   Need for influenza vaccination 02/02/2021   GAD (generalized anxiety disorder) 05/10/2019   Weight gain 05/10/2019   Moderate recurrent major depression (HCC) 10/16/2018   Adult ADHD 09/07/2015    Past Surgical History:  Procedure Laterality Date   LAPAROSCOPIC ENDOMETRIOSIS FULGURATION  2007    SEPTOPLASTY  2005   TUBAL LIGATION      Family History  Problem Relation Age of Onset   Asthma Mother    Lung disease Mother    Thyroid disease Mother    Cancer Father 101   Breast cancer Neg Hx     Social History   Tobacco Use   Smoking status: Never   Smokeless tobacco: Never  Substance Use Topics   Alcohol use: Yes    Comment: on occasion     Current Outpatient Medications:    amphetamine-dextroamphetamine (ADDERALL) 20 MG tablet, Take 1 tablet (20 mg total) by mouth 2 (two) times daily., Disp: 60 tablet, Rfl: 0   buPROPion (WELLBUTRIN XL) 150 MG 24 hr tablet, Take 1 tablet (150 mg total) by mouth daily., Disp: 30 tablet, Rfl: 1   fluticasone (FLONASE) 50 MCG/ACT nasal spray, Place 2 sprays into both nostrils daily., Disp: 16 g, Rfl: 11  Allergies  Allergen Reactions   Penicillins Anaphylaxis   Amoxicillin Rash    I personally reviewed active problem list, medication list, allergies, notes from last encounter, lab results with the patient/caregiver today.   Review of Systems  Psychiatric/Behavioral:  Positive for depression. Negative for substance abuse and suicidal ideas. The patient is nervous/anxious. The patient does not have insomnia.        Objective  Vitals:   02/05/22 0904  BP: 127/86  Pulse: 76  Temp: 97.6 F (36.4 C)  TempSrc: Oral  SpO2: 97%  Weight: 246 lb 1.6 oz (111.6 kg)  Height: 5' 8.9" (1.75 m)    Body mass index is 36.45 kg/m.  Physical Exam Vitals reviewed.  Constitutional:      General: She is awake.     Appearance: Normal appearance. She is well-developed and well-groomed.  HENT:     Head: Normocephalic and atraumatic.  Eyes:     General: Lids are normal. Gaze aligned appropriately.     Extraocular Movements: Extraocular movements intact.     Conjunctiva/sclera: Conjunctivae normal.  Pulmonary:     Effort: Pulmonary effort is normal.  Neurological:     General: No focal deficit present.     Mental Status: She is alert  and oriented to person, place, and time.     GCS: GCS eye subscore is 4. GCS verbal subscore is 5. GCS motor subscore is 6.     Cranial Nerves: No dysarthria or facial asymmetry.     Motor: Motor function is intact. No weakness or tremor.  Psychiatric:        Attention and Perception: Attention and perception normal.        Mood and Affect: Mood and affect normal.        Speech: Speech normal.        Behavior: Behavior normal. Behavior is cooperative.  Thought Content: Thought content normal.        Cognition and Memory: Cognition and memory normal.        Judgment: Judgment normal.      Recent Results (from the past 2160 hour(s))  Cytology - PAP     Status: None   Collection Time: 01/08/22 11:02 AM  Result Value Ref Range   Adequacy      Satisfactory for evaluation; transformation zone component PRESENT.   Diagnosis      - Negative for intraepithelial lesion or malignancy (NILM)  Lipid Profile     Status: Abnormal   Collection Time: 01/08/22 11:24 AM  Result Value Ref Range   Cholesterol, Total 225 (H) 100 - 199 mg/dL   Triglycerides 143 0 - 149 mg/dL   HDL 57 >39 mg/dL   VLDL Cholesterol Cal 25 5 - 40 mg/dL   LDL Chol Calc (NIH) 143 (H) 0 - 99 mg/dL   Chol/HDL Ratio 3.9 0.0 - 4.4 ratio    Comment:                                   T. Chol/HDL Ratio                                             Men  Women                               1/2 Avg.Risk  3.4    3.3                                   Avg.Risk  5.0    4.4                                2X Avg.Risk  9.6    7.1                                3X Avg.Risk 23.4   11.0   Comp Met (CMET)     Status: Abnormal   Collection Time: 01/08/22 11:24 AM  Result Value Ref Range   Glucose 103 (H) 70 - 99 mg/dL   BUN 13 6 - 24 mg/dL   Creatinine, Ser 0.72 0.57 - 1.00 mg/dL   eGFR 101 >59 mL/min/1.73   BUN/Creatinine Ratio 18 9 - 23   Sodium 140 134 - 144 mmol/L   Potassium 4.5 3.5 - 5.2 mmol/L   Chloride 103 96 - 106  mmol/L   CO2 20 20 - 29 mmol/L   Calcium 9.6 8.7 - 10.2 mg/dL   Total Protein 7.3 6.0 - 8.5 g/dL   Albumin 4.5 3.8 - 4.9 g/dL   Globulin, Total 2.8 1.5 - 4.5 g/dL   Albumin/Globulin Ratio 1.6 1.2 - 2.2   Bilirubin Total 0.4 0.0 - 1.2 mg/dL   Alkaline Phosphatase 110 44 - 121 IU/L   AST 18 0 - 40 IU/L   ALT 21 0 - 32 IU/L  CBC w/Diff     Status: None   Collection Time: 01/08/22 11:24  AM  Result Value Ref Range   WBC 6.4 3.4 - 10.8 x10E3/uL   RBC 4.85 3.77 - 5.28 x10E6/uL   Hemoglobin 14.2 11.1 - 15.9 g/dL   Hematocrit 43.5 34.0 - 46.6 %   MCV 90 79 - 97 fL   MCH 29.3 26.6 - 33.0 pg   MCHC 32.6 31.5 - 35.7 g/dL   RDW 12.8 11.7 - 15.4 %   Platelets 305 150 - 450 x10E3/uL   Neutrophils 56 Not Estab. %   Lymphs 34 Not Estab. %   Monocytes 7 Not Estab. %   Eos 2 Not Estab. %   Basos 1 Not Estab. %   Neutrophils Absolute 3.6 1.4 - 7.0 x10E3/uL   Lymphocytes Absolute 2.2 0.7 - 3.1 x10E3/uL   Monocytes Absolute 0.4 0.1 - 0.9 x10E3/uL   EOS (ABSOLUTE) 0.2 0.0 - 0.4 x10E3/uL   Basophils Absolute 0.1 0.0 - 0.2 x10E3/uL   Immature Granulocytes 0 Not Estab. %   Immature Grans (Abs) 0.0 0.0 - 0.1 x10E3/uL  Hepatitis C antibody     Status: None   Collection Time: 01/08/22 11:24 AM  Result Value Ref Range   Hep C Virus Ab Non Reactive Non Reactive    Comment: HCV antibody alone does not differentiate between previously resolved infection and active infection. Equivocal and Reactive HCV antibody results should be followed up with an HCV RNA test to support the diagnosis of active HCV infection.   HgB A1c     Status: Abnormal   Collection Time: 01/08/22 11:24 AM  Result Value Ref Range   Hgb A1c MFr Bld 6.1 (H) 4.8 - 5.6 %    Comment:          Prediabetes: 5.7 - 6.4          Diabetes: >6.4          Glycemic control for adults with diabetes: <7.0    Est. average glucose Bld gHb Est-mCnc 128 mg/dL     PHQ2/9:    02/05/2022    9:12 AM 01/08/2022   10:07 AM 02/02/2021    8:39 AM  11/30/2020   10:43 AM 04/15/2018   10:16 AM  Depression screen PHQ 2/9  Decreased Interest 1 3 0 0 3  Down, Depressed, Hopeless 1 3 0 0 3  PHQ - 2 Score 2 6 0 0 6  Altered sleeping 2 2   3   Tired, decreased energy 2 3   3   Change in appetite 2 2   3   Feeling bad or failure about yourself  1 0   3  Trouble concentrating 2 2   1   Moving slowly or fidgety/restless 2 1   0  Suicidal thoughts 1 0   0  PHQ-9 Score 14 16   19   Difficult doing work/chores Somewhat difficult Somewhat difficult   Somewhat difficult      Fall Risk:    02/05/2022    9:12 AM 01/08/2022   10:06 AM 02/02/2021    8:39 AM 11/30/2020   10:43 AM 05/08/2019    3:27 PM  [place] in the past year? 0 0 1 0 0  Number falls in past yr: 0 0 0 0   Injury with Fall? 0 0 0 0   Risk for fall due to : No Fall Risks No Fall Risks Other (Comment) No Fall Risks   Follow up Falls evaluation completed Falls evaluation completed Falls evaluation completed Falls evaluation completed Falls evaluation completed  Functional Status Survey:      Assessment & Plan  Problem List Items Addressed This Visit       Other   Adult ADHD    Chronic, ongoing Reports difficulty concentrating and some mood changes- unsure if this is caused more by currently exacerbated depression or ADHD or both  She has recently filled script for Adderall 20 mg PO BID - did not mention needing refill today Follow up as needed for regular monitoring and refills.       Relevant Medications   buPROPion (WELLBUTRIN XL) 150 MG 24 hr tablet   Other Relevant Orders   Ambulatory referral to Psychology   Moderate recurrent major depression ([place]) - Primary    Chronic, recurrent, currently exacerbated  Reports depressed mood, sadness, irritability and hypersomnia as major concerns Will try re-implementing Wellbutrin 150 mg PO QD at this time as she reports pretty good response with this previously Discussed therapy and provided resources along with  referral today Follow up in 6 weeks to assess response and make adjustments as needed       Relevant Medications   buPROPion (WELLBUTRIN XL) 150 MG 24 hr tablet   Other Relevant Orders   Ambulatory referral to Psychology      Return in about 6 weeks (around 03/19/2022) for Depression.   I, Fonnie Crookshanks E Jaecob Lowden, PA-C, have reviewed all documentation for this visit. The documentation on 02/05/22 for the exam, diagnosis, procedures, and orders are all accurate and complete.   Talitha Givens, MHS, PA-C [facility]

## 2022-02-05 NOTE — Assessment & Plan Note (Signed)
Chronic, ongoing Reports difficulty concentrating and some mood changes- unsure if this is caused more by currently exacerbated depression or ADHD or both  She has recently filled script for Adderall 20 mg PO BID - did not mention needing refill today Follow up as needed for regular monitoring and refills.

## 2022-02-21 ENCOUNTER — Encounter
Admission: RE | Disposition: A | Discharge status: Home / Self Care | Payer: Self-pay | Source: Ambulatory Visit | Attending: Gastroenterology

## 2022-02-21 ENCOUNTER — Ambulatory Visit: Payer: 59 | Admitting: Certified Registered Nurse Anesthetist

## 2022-02-21 ENCOUNTER — Ambulatory Visit
Admission: RE | Admit: 2022-02-21 | Discharge: 2022-02-21 | Disposition: A | Discharge status: Home / Self Care | Payer: 59 | Source: Ambulatory Visit | Attending: Gastroenterology | Admitting: Gastroenterology

## 2022-02-21 DIAGNOSIS — Z1211 Encounter for screening for malignant neoplasm of colon: Secondary | ICD-10-CM | POA: Insufficient documentation

## 2022-02-21 DIAGNOSIS — Z6833 Body mass index (BMI) 33.0-33.9, adult: Secondary | ICD-10-CM | POA: Diagnosis not present

## 2022-02-21 DIAGNOSIS — F909 Attention-deficit hyperactivity disorder, unspecified type: Secondary | ICD-10-CM | POA: Diagnosis not present

## 2022-02-21 DIAGNOSIS — R69 Illness, unspecified: Secondary | ICD-10-CM | POA: Diagnosis not present

## 2022-02-21 DIAGNOSIS — F419 Anxiety disorder, unspecified: Secondary | ICD-10-CM | POA: Insufficient documentation

## 2022-02-21 DIAGNOSIS — F32A Depression, unspecified: Secondary | ICD-10-CM | POA: Insufficient documentation

## 2022-02-21 DIAGNOSIS — E669 Obesity, unspecified: Secondary | ICD-10-CM | POA: Diagnosis not present

## 2022-02-21 HISTORY — PX: COLONOSCOPY WITH PROPOFOL: SHX5780

## 2022-02-21 LAB — POCT PREGNANCY, URINE: Preg Test, Ur: NEGATIVE

## 2022-02-21 SURGERY — COLONOSCOPY WITH PROPOFOL
Anesthesia: General

## 2022-02-21 MED ORDER — SODIUM CHLORIDE 0.9 % IV SOLN
INTRAVENOUS | Status: DC
  Administered 2022-02-21: 20 mL/h via INTRAVENOUS

## 2022-02-21 MED ORDER — LIDOCAINE HCL (CARDIAC) PF 100 MG/5ML IV SOSY
PREFILLED_SYRINGE | INTRAVENOUS | Status: DC | PRN
  Administered 2022-02-21: 50 mg via INTRAVENOUS

## 2022-02-21 MED ORDER — PROPOFOL 500 MG/50ML IV EMUL
INTRAVENOUS | Status: DC | PRN
  Administered 2022-02-21: 125 ug/kg/min via INTRAVENOUS

## 2022-02-21 MED ORDER — PROPOFOL 10 MG/ML IV BOLUS
INTRAVENOUS | Status: DC | PRN
  Administered 2022-02-21: 30 mg via INTRAVENOUS
  Administered 2022-02-21: 50 mg via INTRAVENOUS

## 2022-02-21 NOTE — Transfer of Care (Signed)
Immediate Anesthesia Transfer of Care Note  Patient: Cassandra Robbins  Procedure(s) Performed: COLONOSCOPY WITH PROPOFOL  Patient Location: PACU  Anesthesia Type:General  Level of Consciousness: awake, alert  and oriented  Airway & Oxygen Therapy: Patient Spontanous Breathing and Patient connected to nasal cannula oxygen  Post-op Assessment: Report given to RN and Post -op Vital signs reviewed and stable  Post vital signs: Reviewed and stable  Last Vitals:  Vitals Value Taken Time  BP    Temp    Pulse    Resp    SpO2      Last Pain:  Vitals:   02/21/22 0729  TempSrc: Temporal  PainSc: 0-No pain         Complications: No notable events documented.

## 2022-02-21 NOTE — H&P (Signed)
Arlyss Repress, MD [address]  [address]  [place], [place] [address]  Main: [phone]  Fax: [phone] Pager: [phone]  Primary Care Physician:  [facility] Primary Gastroenterologist:  Dr. Arlyss Repress  Pre-Procedure History & Physical: HPI:  Cassandra Robbins is a 51 y.o. female is here for an colonoscopy.   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HCC)    Toe fracture, right 10/08/2017    Past Surgical History:  Procedure Laterality Date   LAPAROSCOPIC ENDOMETRIOSIS FULGURATION  2007   SEPTOPLASTY  2005   TUBAL LIGATION      Prior to Admission medications   Medication Sig Start Date End Date Taking? Authorizing Provider  amphetamine-dextroamphetamine (ADDERALL) 20 MG tablet Take 1 tablet (20 mg total) by mouth 2 (two) times daily. 01/08/22  Yes Mecum, Erin E, PA-C  buPROPion (WELLBUTRIN XL) 150 MG 24 hr tablet Take 1 tablet (150 mg total) by mouth daily. 02/05/22 04/06/22 Yes Mecum, Erin E, PA-C  fluticasone (FLONASE) 50 MCG/ACT nasal spray Place 2 sprays into both nostrils daily. 04/15/18  Yes Steele Sizer, MD    Allergies as of 01/10/2022 - Review Complete 01/10/2022  Allergen Reaction Noted   Penicillins Anaphylaxis 09/23/2014   Amoxicillin Rash 09/23/2014    Family History  Problem Relation Age of Onset   Asthma Mother    Lung disease Mother    Thyroid disease Mother    Cancer Father 80   Breast cancer Neg Hx     Social History   Socioeconomic History   Marital status: Divorced    Spouse name: Not on file   Number of children: Not on file   Years of education: Not on file   Highest education level: Not on file  Occupational History   Occupation: paramedic  Tobacco Use   Smoking status: Never   Smokeless tobacco: Never  Vaping Use    Vaping Use: Never used  Substance and Sexual Activity   Alcohol use: Yes    Comment: on occasion   Drug use: No   Sexual activity: Yes  Other Topics Concern   Not on file  Social History Narrative   She has been a paramedic for 32 years.     Social Determinants of Health   Financial Resource Strain: Not on file  Food Insecurity: Not on file  Transportation Needs: Not on file  Physical Activity: Not on file  Stress: Not on file  Social Connections: Not on file  Intimate Partner Violence: Not on file    Review of Systems: See HPI, otherwise negative ROS  Physical Exam: BP 132/79   Pulse 76   Temp (!) 95.8 F (35.4 C) (Temporal)   Resp 20   Ht 5\' 10"  (1.778 m)   Wt 106.6 kg   LMP 01/09/2022 Comment: Urine Preg. Negative  SpO2 100%   BMI 33.72 kg/m  General:   Alert,  pleasant and cooperative in NAD Head:  Normocephalic and atraumatic. Neck:  Supple; no masses or thyromegaly. Lungs:  Clear throughout to auscultation.    Heart:  Regular rate and rhythm. Abdomen:  Soft, nontender and nondistended. Normal bowel sounds, without guarding, and without rebound.   Neurologic:  Alert and  oriented x4;  grossly normal neurologically.  Impression/Plan: Cassandra Robbins is here for an colonoscopy to be performed for colon cancer screening  Risks, benefits, limitations, and alternatives regarding  colonoscopy have been reviewed with the patient.  Questions have been answered.  All parties agreeable.   Sherri Sear, MD  02/21/2022, 8:24 AM

## 2022-02-21 NOTE — Anesthesia Preprocedure Evaluation (Signed)
Anesthesia Evaluation  Patient identified by MRN, date of birth, ID band Patient awake    Reviewed: Allergy & Precautions, NPO status , Patient's Chart, lab work & pertinent test results  History of Anesthesia Complications Negative for: history of anesthetic complications  Airway Mallampati: II  TM Distance: >3 FB Neck ROM: full    Dental  (+) Dental Advidsory Given, Teeth Intact   Pulmonary neg pulmonary ROS, neg COPD,    Pulmonary exam normal        Cardiovascular (-) angina(-) Past MI and (-) CABG negative cardio ROS Normal cardiovascular exam     Neuro/Psych PSYCHIATRIC DISORDERS Anxiety Depression negative neurological ROS     GI/Hepatic negative GI ROS, Neg liver ROS,   Endo/Other  negative endocrine ROS  Renal/GU negative Renal ROS  negative genitourinary   Musculoskeletal   Abdominal   Peds  Hematology negative hematology ROS (+)   Anesthesia Other Findings Past Medical History: No date: Adult ADHD 02/02/2021: Attention deficit disorder No date: Conductive hearing loss in left ear No date: Conductive hearing loss in right ear 03/21/2020: COVID No date: Depression No date: Still's disease ([place]) 10/08/2017: Toe fracture, right  Past Surgical History: 2007: LAPAROSCOPIC ENDOMETRIOSIS FULGURATION 2005: SEPTOPLASTY No date: TUBAL LIGATION  BMI    Body Mass Index: 33.72 kg/m      Reproductive/Obstetrics negative OB ROS                             Anesthesia Physical Anesthesia Plan  ASA: 2  Anesthesia Plan: General   Post-op Pain Management: Minimal or no pain anticipated   Induction: Intravenous  PONV Risk Score and Plan: 3 and Propofol infusion, TIVA and Ondansetron  Airway Management Planned: Nasal Cannula  Additional Equipment: None  Intra-op Plan:   Post-operative Plan:   Informed Consent: I have reviewed the patients History and Physical, chart, labs  and discussed the procedure including the risks, benefits and alternatives for the proposed anesthesia with the patient or authorized representative who has indicated his/her understanding and acceptance.     Dental advisory given  Plan Discussed with: CRNA and Surgeon  Anesthesia Plan Comments: (Discussed risks of anesthesia with patient, including possibility of difficulty with spontaneous ventilation under anesthesia necessitating airway intervention, PONV, and rare risks such as cardiac or respiratory or neurological events, and allergic reactions. Discussed the role of CRNA in patient's perioperative care. Patient understands.)        Anesthesia Quick Evaluation

## 2022-02-21 NOTE — Op Note (Addendum)
[facility] Patient Name: Cassandra Robbins Procedure Date: 02/21/2022 8:24 AM MRN: [id number] Account #: [id number] Date of Birth: Jan 31, 1971 Admit Type: Outpatient Age: 51 Room: [facility] ENDO ROOM 4 Gender: Female Note Status: Finalized Instrument Name: Jasper Riling [id number] Procedure:             Colonoscopy Indications:           Screening for colorectal malignant neoplasm Providers:             Lin Landsman MD, MD Referring MD:          Charlynne Cousins (Referring MD) Medicines:             General Anesthesia Complications:         No immediate complications. Estimated blood loss: None. Procedure:             Pre-Anesthesia Assessment:                        - Prior to the procedure, a History and Physical was                         performed, and patient medications and allergies were                         reviewed. The patient is competent. The risks and                         benefits of the procedure and the sedation options and                         risks were discussed with the patient. All questions                         were answered and informed consent was obtained.                         Patient identification and proposed procedure were                         verified by the physician, the nurse, the                         anesthesiologist, the anesthetist and the technician                         in the pre-procedure area in the procedure room in the                         endoscopy suite. Mental Status Examination: alert and                         oriented. Airway Examination: normal oropharyngeal                         airway and neck mobility. Respiratory Examination:                         clear to auscultation. CV Examination: normal.  Prophylactic Antibiotics: The patient does not require                         prophylactic antibiotics. Prior Anticoagulants: The                          patient has taken no anticoagulant or antiplatelet                         agents. ASA Grade Assessment: II - A patient with mild                         systemic disease. After reviewing the risks and                         benefits, the patient was deemed in satisfactory                         condition to undergo the procedure. The anesthesia                         plan was to use general anesthesia. Immediately prior                         to administration of medications, the patient was                         re-assessed for adequacy to receive sedatives. The                         heart rate, respiratory rate, oxygen saturations,                         blood pressure, adequacy of pulmonary ventilation, and                         response to care were monitored throughout the                         procedure. The physical status of the patient was                         re-assessed after the procedure.                        After obtaining informed consent, the colonoscope was                         passed under direct vision. Throughout the procedure,                         the patient's blood pressure, pulse, and oxygen                         saturations were monitored continuously. The                         Colonoscope was introduced through the anus and  advanced to the the cecum, identified by appendiceal                         orifice and ileocecal valve. The colonoscopy was                         performed without difficulty. The patient tolerated                         the procedure well. The quality of the bowel                         preparation was evaluated using the BBPS [facility] Bowel                         Preparation Scale) with scores of: Right Colon = 3,                         Transverse Colon = 3 and Left Colon = 3 (entire mucosa                         seen well with no residual staining, small fragments                          of stool or opaque liquid). The total BBPS score                         equals 9. The ileocecal valve, appendiceal orifice,                         and rectum were photographed. Findings:      The perianal and digital rectal examinations were normal. Pertinent       negatives include normal sphincter tone and no palpable rectal lesions.      The entire examined colon appeared normal.      The retroflexed view of the distal rectum and anal verge was normal and       showed no anal or rectal abnormalities. Impression:            - The entire examined colon is normal.                        - The distal rectum and anal verge are normal on                         retroflexion view.                        - No specimens collected. Recommendation:        - Discharge patient to home (with escort).                        - Resume previous diet today.                        - Continue present medications.                        - Repeat colonoscopy in  10 years for screening                         purposes. Procedure Code(s):     --- Professional ---                        J8250, Colorectal cancer screening; colonoscopy on                         individual not meeting criteria for high risk Diagnosis Code(s):     --- Professional ---                        Z12.11, Encounter for screening for malignant neoplasm                         of colon CPT copyright 2022 American Medical Association. All rights reserved. The codes documented in this report are preliminary and upon coder review may  be revised to meet current compliance requirements. Dr. Libby Maw Toney Reil MD, MD 02/21/2022 8:53:27 AM This report has been signed electronically. Number of Addenda: 0 Note Initiated On: 02/21/2022 8:24 AM Scope Withdrawal Time: 0 hours 6 minutes 22 seconds  Total Procedure Duration: 0 hours 9 minutes 35 seconds  Estimated Blood Loss:  Estimated blood loss: none.      [facility]

## 2022-02-21 NOTE — Anesthesia Postprocedure Evaluation (Signed)
Anesthesia Post Note  Patient: Cassandra Robbins  Procedure(s) Performed: COLONOSCOPY WITH PROPOFOL  Patient location during evaluation: PACU Anesthesia Type: General Level of consciousness: awake and alert Pain management: pain level controlled Vital Signs Assessment: post-procedure vital signs reviewed and stable Respiratory status: spontaneous breathing, nonlabored ventilation, respiratory function stable and patient connected to nasal cannula oxygen Cardiovascular status: blood pressure returned to baseline and stable Postop Assessment: no apparent nausea or vomiting Anesthetic complications: no   No notable events documented.   Last Vitals:  Vitals:   02/21/22 0853 02/21/22 0903  BP: (!) 103/55 111/73  Pulse: 84 71  Resp: 12 14  Temp: (!) 36.3 C   SpO2: 100% 100%    Last Pain:  Vitals:   02/21/22 0903  TempSrc:   PainSc: 0-No pain                 Dimas Millin

## 2022-02-22 ENCOUNTER — Encounter: Payer: Self-pay | Admitting: Gastroenterology

## 2022-03-04 ENCOUNTER — Other Ambulatory Visit: Payer: Self-pay | Admitting: Physician Assistant

## 2022-03-04 DIAGNOSIS — F909 Attention-deficit hyperactivity disorder, unspecified type: Secondary | ICD-10-CM

## 2022-03-06 MED ORDER — AMPHETAMINE-DEXTROAMPHETAMINE 20 MG PO TABS: 20.0000 mg | ORAL_TABLET | Freq: Two times a day (BID) | ORAL | 0 refills | Status: DC

## 2022-03-19 ENCOUNTER — Encounter: Payer: Self-pay | Admitting: Physician Assistant

## 2022-03-19 ENCOUNTER — Ambulatory Visit (INDEPENDENT_AMBULATORY_CARE_PROVIDER_SITE_OTHER): Payer: 59 | Admitting: Physician Assistant

## 2022-03-19 VITALS — BP 122/75 | HR 73 | Temp 97.6°F | Wt 245.2 lb

## 2022-03-19 DIAGNOSIS — M25562 Pain in left knee: Secondary | ICD-10-CM

## 2022-03-19 DIAGNOSIS — Z23 Encounter for immunization: Secondary | ICD-10-CM | POA: Diagnosis not present

## 2022-03-19 DIAGNOSIS — F909 Attention-deficit hyperactivity disorder, unspecified type: Secondary | ICD-10-CM

## 2022-03-19 DIAGNOSIS — F331 Major depressive disorder, recurrent, moderate: Secondary | ICD-10-CM | POA: Diagnosis not present

## 2022-03-19 DIAGNOSIS — R69 Illness, unspecified: Secondary | ICD-10-CM | POA: Diagnosis not present

## 2022-03-19 MED ORDER — BUPROPION HCL ER (XL) 150 MG PO TB24: 150.0000 mg | ORAL_TABLET | Freq: Every day | ORAL | 3 refills | Status: DC

## 2022-03-19 NOTE — Patient Instructions (Signed)
Please go to [facility] for your left knee pain I believe you may have an mild joint effusion that needs to be drained and they can easily do this for you Please let us know if you continue to have pain or discomfort after this

## 2022-03-19 NOTE — Progress Notes (Unsigned)
Established Patient Office Visit  Name: Cassandra Robbins   MRN: [id number]    DOB: October 11, 1970   Date:03/20/2022  Today's Provider: Talitha Givens, MHS, PA-C Introduced myself to the patient as a PA-C and provided education on APPs in clinical practice.         Subjective  Chief Complaint  Chief Complaint  Patient presents with   Depression   Knee Pain    Patient states her left knee has been hurting and swollen for a few weeks. Does a lot of walking and yoga.     Depression       Knee Pain     Knee pain - left sided Reports she is not able to squat States pain is along the upper portion of patella  Reports pain hurts through the back of her knee as well Reports swelling  Denies trauma or injury  Reports she has been exercising and noticing improvement in mood  Reports pain hurts through the back of her knee as well Reports the joint feels like there is pressure   DEPRESSION  Reports she is doing better Thinks the Wellbutrin is helping and reports exercising is also helping with symptoms   Mood status: better Satisfied with current treatment?: yes Symptom severity: mild  Duration of current treatment : months Side effects: no Medication compliance: excellent compliance Psychotherapy/counseling: no  has missed several calls for therapy services but still seems interested in services  Previous psychiatric medications: wellbutrin Depressed mood: yes Anxious mood: no Anhedonia:  improving  Significant weight loss or gain: no Insomnia: no  states it is improving  Fatigue: no Feelings of worthlessness or guilt: no Impaired concentration/indecisiveness:  mild  Suicidal ideations: no Hopelessness: no Crying spells: no    03/19/2022    9:35 AM 02/05/2022    9:12 AM 01/08/2022   10:07 AM 02/02/2021    8:39 AM 11/30/2020   10:43 AM  Depression screen PHQ 2/9  Decreased Interest _0 0 0  Down, Depressed, Hopeless _1 0 0  PHQ - 2 Score _2 0 0  Altered  sleeping _3 Tired, decreased energy _4 Change in appetite _5 Feeling bad or failure about yourself  1 1 0    Trouble concentrating _6 Moving slowly or fidgety/restless 0 2 1    Suicidal thoughts 0 1 0    PHQ-9 Score _7 Difficult doing work/chores Somewhat difficult Somewhat difficult Somewhat difficult           Patient Active Problem List   Diagnosis Date Noted   Left medial knee pain 03/20/2022   Encounter for screening colonoscopy    Conductive hearing loss in right ear 01/08/2022   Annual physical exam 01/08/2022   Visual changes 02/02/2021   BMI 35.0-35.9,adult 02/02/2021   Hypertriglyceridemia 02/02/2021   Need for influenza vaccination 02/02/2021   GAD (generalized anxiety disorder) 05/10/2019   Weight gain 05/10/2019   Moderate recurrent major depression ([place]) 10/16/2018   Adult ADHD 09/07/2015    Past Surgical History:  Procedure Laterality Date   COLONOSCOPY WITH PROPOFOL N/A 02/21/2022   Procedure: COLONOSCOPY WITH PROPOFOL;  Surgeon: Lin Landsman, MD;  Location: [facility] ENDOSCOPY;  Service: Gastroenterology;  Laterality: N/A;   LAPAROSCOPIC ENDOMETRIOSIS FULGURATION  2007   SEPTOPLASTY  2005   TUBAL  LIGATION      Family History  Problem Relation Age of Onset   Asthma Mother    Lung disease Mother    Thyroid disease Mother    Cancer Father 11   Breast cancer Neg Hx     Social History   Tobacco Use   Smoking status: Never   Smokeless tobacco: Never  Substance Use Topics   Alcohol use: Yes    Comment: on occasion     Current Outpatient Medications:    amphetamine-dextroamphetamine (ADDERALL) 20 MG tablet, Take 1 tablet (20 mg total) by mouth 2 (two) times daily., Disp: 60 tablet, Rfl: 0   fluticasone (FLONASE) 50 MCG/ACT nasal spray, Place 2 sprays into both nostrils daily., Disp: 16 g, Rfl: 11   buPROPion (WELLBUTRIN XL) 150 MG 24 hr tablet, Take 1 tablet (150 mg total) by mouth daily., Disp: 30 tablet, Rfl:  3  Allergies  Allergen Reactions   Penicillins Anaphylaxis   Amoxicillin Rash    I personally reviewed active problem list, medication list, allergies, notes from last encounter, lab results with the patient/caregiver today.   Review of Systems  Musculoskeletal:  Positive for joint pain.  Psychiatric/Behavioral:  Positive for depression.       Objective  Vitals:   03/19/22 0925  BP: 122/75  Pulse: 73  Temp: 97.6 F (36.4 C)  SpO2: 98%  Weight: 245 lb 3.2 oz (111.2 kg)    Body mass index is 35.18 kg/m.  Physical Exam Vitals reviewed.  Constitutional:      General: She is awake.     Appearance: Normal appearance. She is well-developed and well-groomed.  Eyes:     General: Lids are normal. Gaze aligned appropriately.     Extraocular Movements: Extraocular movements intact.     Conjunctiva/sclera: Conjunctivae normal.  Cardiovascular:     Rate and Rhythm: Normal rate and regular rhythm.     Pulses: Normal pulses.          Radial pulses are 2+ on the right side and 2+ on the left side.     Heart sounds: Normal heart sounds. No murmur heard.    No friction rub. No gallop.  Pulmonary:     Effort: Pulmonary effort is normal.  Musculoskeletal:     Cervical back: Normal range of motion and neck supple.     Right knee: Normal. Normal range of motion.     Left knee: Swelling and effusion present. No deformity. Decreased range of motion. Tenderness present over the medial joint line and patellar tendon.     Right lower leg: No edema.     Left lower leg: No edema.  Neurological:     General: No focal deficit present.     Mental Status: She is alert and oriented to person, place, and time.     GCS: GCS eye subscore is 4. GCS verbal subscore is 5. GCS motor subscore is 6.     Cranial Nerves: No dysarthria or facial asymmetry.  Psychiatric:        Attention and Perception: Attention and perception normal.        Mood and Affect: Mood and affect normal.        Speech:  Speech normal.        Behavior: Behavior normal. Behavior is cooperative.        Cognition and Memory: Cognition and memory normal.      Recent Results (from the past 2160 hour(s))  Cytology - PAP  Status: None   Collection Time: 01/08/22 11:02 AM  Result Value Ref Range   Adequacy      Satisfactory for evaluation; transformation zone component PRESENT.   Diagnosis      - Negative for intraepithelial lesion or malignancy (NILM)  Lipid Profile     Status: Abnormal   Collection Time: 01/08/22 11:24 AM  Result Value Ref Range   Cholesterol, Total 225 (H) 100 - 199 mg/dL   Triglycerides 143 0 - 149 mg/dL   HDL 57 >39 mg/dL   VLDL Cholesterol Cal 25 5 - 40 mg/dL   LDL Chol Calc (NIH) 143 (H) 0 - 99 mg/dL   Chol/HDL Ratio 3.9 0.0 - 4.4 ratio    Comment:                                   T. Chol/HDL Ratio                                             Men  Women                               1/2 Avg.Risk  3.4    3.3                                   Avg.Risk  5.0    4.4                                2X Avg.Risk  9.6    7.1                                3X Avg.Risk 23.4   11.0   Comp Met (CMET)     Status: Abnormal   Collection Time: 01/08/22 11:24 AM  Result Value Ref Range   Glucose 103 (H) 70 - 99 mg/dL   BUN 13 6 - 24 mg/dL   Creatinine, Ser 0.72 0.57 - 1.00 mg/dL   eGFR 101 >59 mL/min/1.73   BUN/Creatinine Ratio 18 9 - 23   Sodium 140 134 - 144 mmol/L   Potassium 4.5 3.5 - 5.2 mmol/L   Chloride 103 96 - 106 mmol/L   CO2 20 20 - 29 mmol/L   Calcium 9.6 8.7 - 10.2 mg/dL   Total Protein 7.3 6.0 - 8.5 g/dL   Albumin 4.5 3.8 - 4.9 g/dL   Globulin, Total 2.8 1.5 - 4.5 g/dL   Albumin/Globulin Ratio 1.6 1.2 - 2.2   Bilirubin Total 0.4 0.0 - 1.2 mg/dL   Alkaline Phosphatase 110 44 - 121 IU/L   AST 18 0 - 40 IU/L   ALT 21 0 - 32 IU/L  CBC w/Diff     Status: None   Collection Time: 01/08/22 11:24 AM  Result Value Ref Range   WBC 6.4 3.4 - 10.8 x10E3/uL   RBC 4.85 3.77 -  5.28 x10E6/uL   Hemoglobin 14.2 11.1 - 15.9 g/dL   Hematocrit 43.5 34.0 - 46.6 %   MCV 90 79 - 97 fL   MCH  29.3 26.6 - 33.0 pg   MCHC 32.6 31.5 - 35.7 g/dL   RDW 12.8 11.7 - 15.4 %   Platelets 305 150 - 450 x10E3/uL   Neutrophils 56 Not Estab. %   Lymphs 34 Not Estab. %   Monocytes 7 Not Estab. %   Eos 2 Not Estab. %   Basos 1 Not Estab. %   Neutrophils Absolute 3.6 1.4 - 7.0 x10E3/uL   Lymphocytes Absolute 2.2 0.7 - 3.1 x10E3/uL   Monocytes Absolute 0.4 0.1 - 0.9 x10E3/uL   EOS (ABSOLUTE) 0.2 0.0 - 0.4 x10E3/uL   Basophils Absolute 0.1 0.0 - 0.2 x10E3/uL   Immature Granulocytes 0 Not Estab. %   Immature Grans (Abs) 0.0 0.0 - 0.1 x10E3/uL  Hepatitis C antibody     Status: None   Collection Time: 01/08/22 11:24 AM  Result Value Ref Range   Hep C Virus Ab Non Reactive Non Reactive    Comment: HCV antibody alone does not differentiate between previously resolved infection and active infection. Equivocal and Reactive HCV antibody results should be followed up with an HCV RNA test to support the diagnosis of active HCV infection.   HgB A1c     Status: Abnormal   Collection Time: 01/08/22 11:24 AM  Result Value Ref Range   Hgb A1c MFr Bld 6.1 (H) 4.8 - 5.6 %    Comment:          Prediabetes: 5.7 - 6.4          Diabetes: >6.4          Glycemic control for adults with diabetes: <7.0    Est. average glucose Bld gHb Est-mCnc 128 mg/dL  Pregnancy, urine POC     Status: None   Collection Time: 02/21/22  7:40 AM  Result Value Ref Range   Preg Test, Ur NEGATIVE NEGATIVE    Comment:        THE SENSITIVITY OF THIS METHODOLOGY IS >24 mIU/mL      PHQ2/9:    03/19/2022    9:35 AM 02/05/2022    9:12 AM 01/08/2022   10:07 AM 02/02/2021    8:39 AM 11/30/2020   10:43 AM  Depression screen PHQ 2/9  Decreased Interest _0 0 0  Down, Depressed, Hopeless _1 0 0  PHQ - 2 Score _2 0 0  Altered sleeping _3 Tired, decreased energy _4 Change in appetite _5 Feeling  bad or failure about yourself  1 1 0    Trouble concentrating _6 Moving slowly or fidgety/restless 0 2 1    Suicidal thoughts 0 1 0    PHQ-9 Score _7 Difficult doing work/chores Somewhat difficult Somewhat difficult Somewhat difficult        Fall Risk:    02/05/2022    9:12 AM 01/08/2022   10:06 AM 02/02/2021    8:39 AM 11/30/2020   10:43 AM 05/08/2019    3:27 PM  Fall Risk   Falls in the past year? 0 0 1 0 0  Number falls in past yr: 0 0 0 0   Injury with Fall? 0 0 0 0   Risk for fall due to : No Fall Risks No Fall Risks Other (Comment) No Fall Risks   Follow up Falls evaluation completed Falls evaluation completed Falls evaluation completed Falls evaluation completed Falls  evaluation completed      Assessment & Plan  Problem List Items Addressed This Visit       Other   Adult ADHD    Chronic, ongoing  Reports some improvement since Wellbutrin 150 mg PO QD was started  She is taking Adderall 20 mg PO BID as well and reports this has also improved her ability to concentrate States she is exercising and trying to live by routine as this provides more structure for her  PDMP reviewed, no evidence of worrisome controlled substance use patterns at this time  Continue current regimen Follow up in 3 months for medication management      Relevant Medications   buPROPion (WELLBUTRIN XL) 150 MG 24 hr tablet   Moderate recurrent major depression (HCC) - Primary    Chronic, historic condition PHQ9 at 7 today which is much improved from previous visit She reports she is doing much better on Wellbutrin 150 mg PO QD and is tolerating well She has not connected with therapy services but is open to it  She is exercising more regularly and trying to engage with creative endeavors which is beneficial to depression symptoms Continue with current regimen- refills provided today Follow up in 3 months to assess progress and for medication management      Relevant Medications    buPROPion (WELLBUTRIN XL) 150 MG 24 hr tablet   Need for influenza vaccination   Relevant Orders   Flu Vaccine QUAD 6+ mos PF IM (Fluarix Quad PF) (Completed)   Left medial knee pain    Acute, new concern Reports her left knee has started to feel like there is increased pressure and some swelling Particularly swollen and painful along medial joint line Suspect a joint effusion or Baker's cyst at this time Discussed referral to Ortho vs [facility]- she reports she can go to [facility] for evaluation so no referral provided today Recommend warm compresses, NSAIDs and Tylenol, braces to assist with discomfort pending Ortho evaluation  Follow up as needed for persistent or progressing symptoms         No follow-ups on file.   I, Raynesha Tiedt E Alejandra Hunt, PA-C, have reviewed all documentation for this visit. The documentation on 03/20/22 for the exam, diagnosis, procedures, and orders are all accurate and complete.   Talitha Givens, MHS, PA-C [facility]

## 2022-03-20 DIAGNOSIS — M25562 Pain in left knee: Secondary | ICD-10-CM | POA: Insufficient documentation

## 2022-03-20 NOTE — Assessment & Plan Note (Signed)
Acute, new concern Reports her left knee has started to feel like there is increased pressure and some swelling Particularly swollen and painful along medial joint line Suspect a joint effusion or Baker's cyst at this time Discussed referral to Ortho vs [facility]- she reports she can go to [facility] for evaluation so no referral provided today Recommend warm compresses, NSAIDs and Tylenol, braces to assist with discomfort pending Ortho evaluation  Follow up as needed for persistent or progressing symptoms

## 2022-03-20 NOTE — Assessment & Plan Note (Signed)
Chronic, historic condition PHQ9 at 7 today which is much improved from previous visit She reports she is doing much better on Wellbutrin 150 mg PO QD and is tolerating well She has not connected with therapy services but is open to it  She is exercising more regularly and trying to engage with creative endeavors which is beneficial to depression symptoms Continue with current regimen- refills provided today Follow up in 3 months to assess progress and for medication management

## 2022-03-20 NOTE — Assessment & Plan Note (Signed)
Chronic, ongoing  Reports some improvement since Wellbutrin 150 mg PO QD was started  She is taking Adderall 20 mg PO BID as well and reports this has also improved her ability to concentrate States she is exercising and trying to live by routine as this provides more structure for her  PDMP reviewed, no evidence of worrisome controlled substance use patterns at this time  Continue current regimen Follow up in 3 months for medication management

## 2022-04-03 ENCOUNTER — Other Ambulatory Visit: Payer: Self-pay | Admitting: Physician Assistant

## 2022-04-03 DIAGNOSIS — F909 Attention-deficit hyperactivity disorder, unspecified type: Secondary | ICD-10-CM

## 2022-04-04 MED ORDER — AMPHETAMINE-DEXTROAMPHETAMINE 20 MG PO TABS: 20.0000 mg | ORAL_TABLET | Freq: Two times a day (BID) | ORAL | 0 refills | Status: DC

## 2022-04-09 ENCOUNTER — Ambulatory Visit (INDEPENDENT_AMBULATORY_CARE_PROVIDER_SITE_OTHER): Payer: 59

## 2022-04-09 DIAGNOSIS — Z23 Encounter for immunization: Secondary | ICD-10-CM | POA: Diagnosis not present

## 2022-05-10 ENCOUNTER — Other Ambulatory Visit: Payer: Self-pay | Admitting: Physician Assistant

## 2022-05-10 DIAGNOSIS — F909 Attention-deficit hyperactivity disorder, unspecified type: Secondary | ICD-10-CM

## 2022-05-25 ENCOUNTER — Telehealth: Payer: 59 | Admitting: Nurse Practitioner

## 2022-05-25 ENCOUNTER — Encounter: Payer: Self-pay | Admitting: Nurse Practitioner

## 2022-05-25 DIAGNOSIS — J0110 Acute frontal sinusitis, unspecified: Secondary | ICD-10-CM | POA: Diagnosis not present

## 2022-05-25 MED ORDER — DOXYCYCLINE HYCLATE 100 MG PO TABS: 100.0000 mg | ORAL_TABLET | Freq: Two times a day (BID) | ORAL | 0 refills | Status: AC

## 2022-05-25 NOTE — Progress Notes (Signed)
Virtual Visit Consent   Cassandra Robbins, you are scheduled for a virtual visit with a Cassandra Robbins provider today. Just as with appointments in the office, your consent must be obtained to participate. Your consent will be active for this visit and any virtual visit you may have with one of our providers in the next 365 days. If you have a MyChart account, a copy of this consent can be sent to you electronically.  As this is a virtual visit, video technology does not allow for your provider to perform a traditional examination. This may limit your provider's ability to fully assess your condition. If your provider identifies any concerns that need to be evaluated in person or the need to arrange testing (such as labs, EKG, etc.), we will make arrangements to do so. Although advances in technology are sophisticated, we cannot ensure that it will always work on either your end or our end. If the connection with a video visit is poor, the visit may have to be switched to a telephone visit. With either a video or telephone visit, we are not always able to ensure that we have a secure connection.  By engaging in this virtual visit, you consent to the provision of healthcare and authorize for your insurance to be billed (if applicable) for the services provided during this visit. Depending on your insurance coverage, you may receive a charge related to this service.  I need to obtain your verbal consent now. Are you willing to proceed with your visit today? Chee BLAIRE PALOMINO has provided verbal consent on 05/25/2022 for a virtual visit (video or telephone). Apolonio Schneiders, FNP  Date: 05/25/2022 10:18 AM  Virtual Visit via Video Note   I, Apolonio Schneiders, connected with  Cassandra Robbins  ([id number], [birth year]) on 05/25/22 at 10:30 AM EST by a video-enabled telemedicine application and verified that I am speaking with the correct person using two identifiers.  Location: Patient: Virtual Visit Location Patient:  Home Provider: Virtual Visit Location Provider: Home Office   I discussed the limitations of evaluation and management by telemedicine and the availability of in person appointments. The patient expressed understanding and agreed to proceed.    History of Present Illness: Cassandra Robbins is a 52 y.o. who identifies as a female who was assigned female at birth, and is being seen today for sinus congestion that she has been suffering from for the week. Started with nasal congestion and sore throat, progressed to worsening congestion, PND with los of voice- has not had any cough   Started to have a fever today   Tested negative for COVID twice   She has been using Nyquil for relief  Uses Flonase daily   Problems:  Patient Active Problem List   Diagnosis Date Noted   Left medial knee pain 03/20/2022   Encounter for screening colonoscopy    Conductive hearing loss in right ear 01/08/2022   Annual physical exam 01/08/2022   Visual changes 02/02/2021   BMI 35.0-35.9,adult 02/02/2021   Hypertriglyceridemia 02/02/2021   Need for influenza vaccination 02/02/2021   GAD (generalized anxiety disorder) 05/10/2019   Weight gain 05/10/2019   Moderate recurrent major depression ([place]) 10/16/2018   Adult ADHD 09/07/2015    Allergies:  Allergies  Allergen Reactions   Penicillins Anaphylaxis   Amoxicillin Rash   Medications:  Current Outpatient Medications:    amphetamine-dextroamphetamine (ADDERALL) 20 MG tablet, Take 1 tablet (20 mg total) by mouth 2 (two) times daily., Disp: 60 tablet,  Rfl: 0   buPROPion (WELLBUTRIN XL) 150 MG 24 hr tablet, Take 1 tablet (150 mg total) by mouth daily., Disp: 30 tablet, Rfl: 3   fluticasone (FLONASE) 50 MCG/ACT nasal spray, Place 2 sprays into both nostrils daily., Disp: 16 g, Rfl: 11  Observations/Objective: Patient is well-developed, well-nourished in no acute distress.  Resting comfortably  at home.  Head is normocephalic, atraumatic.  No labored  breathing.  Speech is clear and coherent with logical content.  Patient is alert and oriented at baseline.    Assessment and Plan: Meds ordered this encounter  Medications   doxycycline (VIBRA-TABS) 100 MG tablet    Sig: Take 1 tablet (100 mg total) by mouth 2 (two) times daily for 10 days.    Dispense:  20 tablet    Refill:  0   Continue flonase daily and decongestant until symptoms improve    Follow Up Instructions: I discussed the assessment and treatment plan with the patient. The patient was provided an opportunity to ask questions and all were answered. The patient agreed with the plan and demonstrated an understanding of the instructions.  A copy of instructions were sent to the patient via MyChart unless otherwise noted below.    The patient was advised to call back or seek an in-person evaluation if the symptoms worsen or if the condition fails to improve as anticipated.  Time:  I spent 10 minutes with the patient via telehealth technology discussing the above problems/concerns.    Apolonio Schneiders, FNP

## 2022-05-31 DIAGNOSIS — Z419 Encounter for procedure for purposes other than remedying health state, unspecified: Secondary | ICD-10-CM | POA: Diagnosis not present

## 2022-06-08 ENCOUNTER — Telehealth: Payer: 59 | Admitting: Nurse Practitioner

## 2022-06-08 DIAGNOSIS — R0981 Nasal congestion: Secondary | ICD-10-CM | POA: Diagnosis not present

## 2022-06-08 DIAGNOSIS — J40 Bronchitis, not specified as acute or chronic: Secondary | ICD-10-CM | POA: Diagnosis not present

## 2022-06-08 MED ORDER — AZITHROMYCIN 250 MG PO TABS: ORAL_TABLET | ORAL | 0 refills | Status: AC

## 2022-06-08 MED ORDER — FLUTICASONE PROPIONATE 50 MCG/ACT NA SUSP: 2.0000 | Freq: Every day | NASAL | 6 refills | Status: AC

## 2022-06-08 MED ORDER — ALBUTEROL SULFATE HFA 108 (90 BASE) MCG/ACT IN AERS: 2.0000 | INHALATION_SPRAY | Freq: Four times a day (QID) | RESPIRATORY_TRACT | 0 refills | Status: DC | PRN

## 2022-06-08 NOTE — Progress Notes (Signed)
Virtual Visit Consent   Cassandra Robbins, you are scheduled for a virtual visit with a Cassandra Robbins today. Just as with appointments in the office, your consent must be obtained to participate. Your consent will be active for this visit and any virtual visit you may have with one of our providers in the next 365 days. If you have a MyChart account, a copy of this consent can be sent to you electronically.  As this is a virtual visit, video technology does not allow for your Robbins to perform a traditional examination. This may limit your Robbins's ability to fully assess your condition. If your Robbins identifies any concerns that need to be evaluated in person or the need to arrange testing (such as labs, EKG, etc.), we will make arrangements to do so. Although advances in technology are sophisticated, we cannot ensure that it will always work on either your end or our end. If the connection with a video visit is poor, the visit may have to be switched to a telephone visit. With either a video or telephone visit, we are not always able to ensure that we have a secure connection.  By engaging in this virtual visit, you consent to the provision of healthcare and authorize for your insurance to be billed (if applicable) for the services provided during this visit. Depending on your insurance coverage, you may receive a charge related to this service.  I need to obtain your verbal consent now. Are you willing to proceed with your visit today? Cassandra Robbins has provided verbal consent on 06/08/2022 for a virtual visit (video or telephone). Cassandra Schneiders, FNP  Date: 06/08/2022 5:15 PM  Virtual Visit via Video Note   I, Cassandra Robbins, connected with  Cassandra Robbins  ([id number], [birth year]) on 06/08/22 at  5:15 PM EST by a video-enabled telemedicine application and verified that I am speaking with the correct person using two identifiers.  Location: Patient: Virtual Visit Location Patient:  Home Robbins: Virtual Visit Location Robbins: Home Office   I discussed the limitations of evaluation and management by telemedicine and the availability of in person appointments. The patient expressed understanding and agreed to proceed.    History of Present Illness: Cassandra Robbins is a 52 y.o. who identifies as a female who was assigned female at birth, and is being seen today for a recent onset of cough. She has been using Mucinex max   She was  treated for a sinus infection 2 weeks ago and finished her doxycyline earlier this week. Since coming off the antibiotic she has had increased fatigue and had onset of a cough in the past 24 hours.   Her cough is productive, her nasal congestion has returned.  She also has developed a wheeze in her upper airway   Denies a history of asthma She does have an Albuterol inhaler that she has from a prior bronchitis that she has been using today.   Denies a recent fever  Has not retested for COVID  She has taken oral steroids in the past and this has caused her to need a very slow taper over one month due to history of autoimmune disease. She has not needed to take it in 3 years, but has in the past   She is a paramedic   She has flonase but has not been using it  Had COVID two years ago  Problems:  Patient Active Problem List   Diagnosis Date Noted   Left  medial knee pain 03/20/2022   Encounter for screening colonoscopy    Conductive hearing loss in right ear 01/08/2022   Annual physical exam 01/08/2022   Visual changes 02/02/2021   BMI 35.0-35.9,adult 02/02/2021   Hypertriglyceridemia 02/02/2021   Need for influenza vaccination 02/02/2021   GAD (generalized anxiety disorder) 05/10/2019   Weight gain 05/10/2019   Moderate recurrent major depression ([place]) 10/16/2018   Adult ADHD 09/07/2015    Allergies:  Allergies  Allergen Reactions   Penicillins Anaphylaxis   Amoxicillin Rash   Medications:  Current Outpatient Medications:     amphetamine-dextroamphetamine (ADDERALL) 20 MG tablet, Take 1 tablet (20 mg total) by mouth 2 (two) times daily., Disp: 60 tablet, Rfl: 0   buPROPion (WELLBUTRIN XL) 150 MG 24 hr tablet, Take 1 tablet (150 mg total) by mouth daily., Disp: 30 tablet, Rfl: 3   fluticasone (FLONASE) 50 MCG/ACT nasal spray, Place 2 sprays into both nostrils daily., Disp: 16 g, Rfl: 11  Observations/Objective: Patient is well-developed, well-nourished in no acute distress.  Resting comfortably  at home.  Head is normocephalic, atraumatic.  No labored breathing.  Speech is clear and coherent with logical content.  Patient is alert and oriented at baseline.    Assessment and Plan: 1. Bronchitis  - azithromycin (ZITHROMAX) 250 MG tablet; Take 2 tablets on day 1, then 1 tablet daily on days 2 through 5  Dispense: 6 tablet; Refill: 0 - albuterol (VENTOLIN HFA) 108 (90 Base) MCG/ACT inhaler; Inhale 2 puffs into the lungs every 6 (six) hours as needed for wheezing or shortness of breath.  Dispense: 8 g; Refill: 0  2. Sinus congestion  - fluticasone (FLONASE) 50 MCG/ACT nasal spray; Place 2 sprays into both nostrils daily.  Dispense: 16 g; Refill: 6    If symptoms persist beyond 48 hours without improvement or with worsening symptoms advised UC for follow up   Follow Up Instructions: I discussed the assessment and treatment plan with the patient. The patient was provided an opportunity to ask questions and all were answered. The patient agreed with the plan and demonstrated an understanding of the instructions.  A copy of instructions were sent to the patient via MyChart unless otherwise noted below.    The patient was advised to call back or seek an in-person evaluation if the symptoms worsen or if the condition fails to improve as anticipated.  Time:  I spent 15 minutes with the patient via telehealth technology discussing the above problems/concerns.    Cassandra Schneiders, FNP

## 2022-06-09 ENCOUNTER — Other Ambulatory Visit: Payer: Self-pay | Admitting: Physician Assistant

## 2022-06-09 DIAGNOSIS — F909 Attention-deficit hyperactivity disorder, unspecified type: Secondary | ICD-10-CM

## 2022-06-11 MED ORDER — AMPHETAMINE-DEXTROAMPHETAMINE 20 MG PO TABS: 20.0000 mg | ORAL_TABLET | Freq: Two times a day (BID) | ORAL | 0 refills | Status: DC

## 2022-06-11 NOTE — Telephone Encounter (Signed)
Patient last seen by Junie Panning.

## 2022-06-12 ENCOUNTER — Ambulatory Visit (INDEPENDENT_AMBULATORY_CARE_PROVIDER_SITE_OTHER): Payer: 59

## 2022-06-12 ENCOUNTER — Ambulatory Visit
Admission: RE | Admit: 2022-06-12 | Discharge: 2022-06-12 | Disposition: A | Discharge status: Home / Self Care | Payer: 59 | Source: Ambulatory Visit | Attending: Emergency Medicine | Admitting: Emergency Medicine

## 2022-06-12 VITALS — BP 124/85 | HR 87 | Temp 98.7°F | Resp 16 | Ht 70.0 in | Wt 245.2 lb

## 2022-06-12 DIAGNOSIS — R051 Acute cough: Secondary | ICD-10-CM

## 2022-06-12 DIAGNOSIS — R059 Cough, unspecified: Secondary | ICD-10-CM | POA: Diagnosis not present

## 2022-06-12 MED ORDER — BENZONATATE 100 MG PO CAPS: 100.0000 mg | ORAL_CAPSULE | Freq: Three times a day (TID) | ORAL | 1 refills | Status: DC

## 2022-06-12 MED ORDER — PREDNISONE 10 MG (21) PO TBPK: ORAL_TABLET | Freq: Every day | ORAL | 0 refills | Status: DC

## 2022-06-12 MED ORDER — PROMETHAZINE-DM 6.25-15 MG/5ML PO SYRP: 5.0000 mL | ORAL_SOLUTION | Freq: Four times a day (QID) | ORAL | 0 refills | Status: DC | PRN

## 2022-06-12 NOTE — Discharge Instructions (Signed)
Your lungs are clear and you are getting enough air, persistent dry cough with wheezing with this  Chest x-ray is negative  As you have recently completed 2 antibiotics with no improvement of symptoms do not feel as if symptoms are related to bacteria, most likely cough is a result of recent airway irritation  Starting tomorrow begin prednisone every morning as directed to reduce inflammation to the airway, this should help with wheezing and shortness of breath and coughing  You may continue using albuterol inhaler for shortness of breath and wheezing as directed  You may use Tessalon Perles every 8 hours to help calm your coughing, may take 1 to 2 tablets  You may use cough syrup every 6 hours as needed for additional comfort, be mindful of this medication may make you drowsy  Be sure that your daughter warm stuffy dry environments is making more difficult to breathe, may need to turn off iron or crack window especially at bedtime  You may use a humidifier at bedtime to help moisten airway, if you do not have a humidifier you may steam up your bathroom and sit inside for 5 to 10 minutes to help moisten the airway  If your symptoms continue to persist or worsen please schedule an in person visit with your primary doctor for reevaluation

## 2022-06-12 NOTE — ED Triage Notes (Signed)
Pt c/o cough, shortness of breath. Started January 19. Covid test negative. She has been on doxy and zpak without relief. Was told if she was not better within 48 hours she should have a chest xray.

## 2022-06-12 NOTE — ED Provider Notes (Signed)
[facility]    CSN: [id number] Arrival date & time: 06/12/22  1646      History   Chief Complaint Chief Complaint  Patient presents with   Cough   Shortness of Breath    HPI Cassandra Robbins is a 52 y.o. female.   Patient presents for evaluation of a persistent cough for 4 to 5 weeks.  Endorses that she completed a virtual visit, diagnosed with a sinus infection and given doxycycline, completed course and symptoms seem to be improving then after few days times worsened with associated shortness of breath with exertion and wheezing.  Completed a virtual visit approximately 6 days ago, diagnosed with bronchitis, will finish last dosage of azithromycin today, minimal improvement seen with symptoms.  Has had increased fatigue.  Initially experienced fever chills and bodyaches which have resolved.  Has taken a home COVID test twice both negative.  No known sick contacts prior.  Has been tolerating food and liquids.  Additionally has attempted increased rest, increase fluid intake, NyQuil, DayQuil, Mucinex and sinus cold and flu medication which has been ineffective.  Denies respiratory history.  Non-smoker.   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[place])    Toe fracture, right 10/08/2017    Patient Active Problem List   Diagnosis Date Noted   Left medial knee pain 03/20/2022   Encounter for screening colonoscopy    Conductive hearing loss in right ear 01/08/2022   Annual physical exam 01/08/2022   Visual changes 02/02/2021   BMI 35.0-35.9,adult 02/02/2021   Hypertriglyceridemia 02/02/2021   Need for influenza vaccination 02/02/2021   GAD (generalized anxiety disorder) 05/10/2019   Weight gain 05/10/2019   Moderate recurrent major depression ([place]) 10/16/2018   Adult ADHD 09/07/2015     Past Surgical History:  Procedure Laterality Date   COLONOSCOPY WITH PROPOFOL N/A 02/21/2022   Procedure: COLONOSCOPY WITH PROPOFOL;  Surgeon: Lin Landsman, MD;  Location: [place];  Service: Gastroenterology;  Laterality: N/A;   LAPAROSCOPIC ENDOMETRIOSIS FULGURATION  2007   SEPTOPLASTY  2005   TUBAL LIGATION      OB History   No obstetric history on file.      Home Medications    Prior to Admission medications   Medication Sig Start Date End Date Taking? Authorizing Provider  albuterol (VENTOLIN HFA) 108 (90 Base) MCG/ACT inhaler Inhale 2 puffs into the lungs every 6 (six) hours as needed for wheezing or shortness of breath. 06/08/22  Yes Apolonio Schneiders, FNP  amphetamine-dextroamphetamine (ADDERALL) 20 MG tablet Take 1 tablet (20 mg total) by mouth 2 (two) times daily. 06/11/22  Yes Mecum, Erin E, PA-C  azithromycin (ZITHROMAX) 250 MG tablet Take 2 tablets on day 1, then 1 tablet daily on days 2 through 5 06/08/22 06/13/22 Yes Apolonio Schneiders, FNP  buPROPion (WELLBUTRIN XL) 150 MG 24 hr tablet Take 1 tablet (150 mg total) by mouth daily. 03/19/22 07/17/22 Yes Mecum, Erin E, PA-C  fluticasone (FLONASE) 50 MCG/ACT nasal spray Place 2 sprays into both nostrils daily. 04/15/18  Yes Crissman, Jeannette How, MD  fluticasone (FLONASE) 50 MCG/ACT nasal spray Place 2 sprays into both nostrils daily. 06/08/22  Yes Apolonio Schneiders, FNP    Family History Family History  Problem Relation Age of Onset  Asthma Mother    Lung disease Mother    Thyroid disease Mother    Cancer Father 100   Breast cancer Neg Hx     Social History Social History   Tobacco Use   Smoking status: Never   Smokeless tobacco: Never  Vaping Use   Vaping Use: Never used  Substance Use Topics   Alcohol use: Yes    Comment: on occasion   Drug use: No     Allergies   Penicillins and Amoxicillin   Review of Systems Review of Systems  Constitutional:  Positive for chills and fever. Negative for activity  change, appetite change, diaphoresis, fatigue and unexpected weight change.  HENT:  Positive for congestion, rhinorrhea and sore throat. Negative for dental problem, drooling, ear discharge, ear pain, facial swelling, hearing loss, mouth sores, nosebleeds, postnasal drip, sinus pressure, sinus pain, sneezing, tinnitus, trouble swallowing and voice change.   Respiratory:  Positive for cough, chest tightness, shortness of breath and wheezing. Negative for apnea, choking and stridor.   Cardiovascular: Negative.   Gastrointestinal: Negative.   Skin: Negative.   Neurological: Negative.      Physical Exam Triage Vital Signs ED Triage Vitals  Enc Vitals Group     BP 06/12/22 1725 124/85     Pulse Rate 06/12/22 1725 87     Resp 06/12/22 1725 16     Temp 06/12/22 1725 98.7 F (37.1 C)     Temp Source 06/12/22 1725 Oral     SpO2 06/12/22 1725 96 %     Weight 06/12/22 1723 245 lb 2.4 oz (111.2 kg)     Height 06/12/22 1723 5' 10"$  (1.778 m)     Head Circumference --      Peak Flow --      Pain Score 06/12/22 1722 0     Pain Loc --      Pain Edu? --      Excl. in [place]? --    No data found.  Updated Vital Signs BP 124/85 (BP Location: Left Arm)   Pulse 87   Temp 98.7 F (37.1 C) (Oral)   Resp 16   Ht 5' 10"$  (1.778 m)   Wt 245 lb 2.4 oz (111.2 kg)   SpO2 96%   BMI 35.18 kg/m   Visual Acuity Right Eye Distance:   Left Eye Distance:   Bilateral Distance:    Right Eye Near:   Left Eye Near:    Bilateral Near:     Physical Exam Constitutional:      Appearance: Normal appearance.  HENT:     Head: Normocephalic.     Right Ear: Tympanic membrane, ear canal and external ear normal.     Left Ear: Tympanic membrane, ear canal and external ear normal.     Nose: Congestion present. No rhinorrhea.     Mouth/Throat:     Mouth: Mucous membranes are moist.     Pharynx: Posterior oropharyngeal erythema present.  Eyes:     Extraocular Movements: Extraocular movements intact.   Cardiovascular:     Rate and Rhythm: Normal rate and regular rhythm.     Pulses: Normal pulses.     Heart sounds: Normal heart sounds.  Pulmonary:     Effort: Pulmonary effort is normal.     Breath sounds: Normal breath sounds.     Comments: Persistent dry cough with wheezing witnessed Skin:    General: Skin is warm and dry.  Neurological:     Mental Status: She is  alert and oriented to person, place, and time. Mental status is at baseline.      UC Treatments / Results  Labs (all labs ordered are listed, but only abnormal results are displayed) Labs Reviewed - No data to display  EKG   Radiology DG Chest 2 View  Result Date: 06/12/2022 CLINICAL DATA:  Cough. EXAM: CHEST - 2 VIEW COMPARISON:  None Available. FINDINGS: The heart size and mediastinal contours are within normal limits. Both lungs are clear. The visualized skeletal structures are unremarkable. IMPRESSION: No active cardiopulmonary disease. Electronically Signed   By: Marijo Conception M.D.   On: 06/12/2022 17:33    Procedures Procedures (including critical care time)  Medications Ordered in UC Medications - No data to display  Initial Impression / Assessment and Plan / UC Course  I have reviewed the triage vital signs and the nursing notes.  Pertinent labs & imaging results that were available during my care of the patient were reviewed by me and considered in my medical decision making (see chart for details).  Acute cough  Vital signs are stable lungs are clear to auscultation, O2 saturation 96% on room air, chest x-ray is negative, discussed all findings with patient, as she is recently completed 2 antibiotic courses with no improvement in symptoms low suspicion that bacteria is a causing etiology of symptoms is most likely post cough syndrome, discussed this with patient, prednisone taper prescribed as well as Tessalon and Promethazine DM for outpatient management, may use additional over-the-counter  medications as needed, discussed use of humidifier or steam bathroom to keep airways moist and, recommended in person evaluation if symptoms continue to persist Final Clinical Impressions(s) / UC Diagnoses   Final diagnoses:  None   Discharge Instructions   None    ED Prescriptions   None    PDMP not reviewed this encounter.   Hans Eden, NP 06/12/22 1815

## 2022-06-18 ENCOUNTER — Telehealth: Payer: 59 | Admitting: Family Medicine

## 2022-06-18 DIAGNOSIS — B372 Candidiasis of skin and nail: Secondary | ICD-10-CM

## 2022-06-18 MED ORDER — NYSTATIN-TRIAMCINOLONE 100000-0.1 UNIT/GM-% EX OINT: 1.0000 | TOPICAL_OINTMENT | Freq: Two times a day (BID) | CUTANEOUS | 0 refills | Status: DC

## 2022-06-18 NOTE — Progress Notes (Signed)
E Visit for Rash  We are sorry that you are not feeling well. Here is how we plan to help!  Based on what you shared with me it looks like you have contact dermatitis.  Contact dermatitis is a skin rash caused by something that touches the skin and causes irritation or inflammation.  Your skin may be red, swollen, dry, cracked, and itch.  The rash should go away in a few days but can last a few weeks.  If you get a rash, it's important to figure out what caused it so the irritant can be avoided in the future.   Based upon your presentation it appears you have a fungal infection.  I have prescribed:  Mycolog ointment to be applied twice daily for the next week.  HOME CARE:  Take cool showers and avoid direct sunlight. Apply cool compress or wet dressings. Take a bath in an oatmeal bath.  Sprinkle content of one Aveeno packet under running faucet with comfortably warm water.  Bathe for 15-20 minutes, 1-2 times daily.  Pat dry with a towel. Do not rub the rash. Use hydrocortisone cream. Take an antihistamine like Benadryl for widespread rashes that itch.  The adult dose of Benadryl is 25-50 mg by mouth 4 times daily. Caution:  This type of medication may cause sleepiness.  Do not drink alcohol, drive, or operate dangerous machinery while taking antihistamines.  Do not take these medications if you have prostate enlargement.  Read package instructions thoroughly on all medications that you take.  GET HELP RIGHT AWAY IF:  Symptoms don't go away after treatment. Severe itching that persists. If you rash spreads or swells. If you rash begins to smell. If it blisters and opens or develops a yellow-brown crust. You develop a fever. You have a sore throat. You become short of breath.  MAKE SURE YOU:  Understand these instructions. Will watch your condition. Will get help right away if you are not doing well or get worse.  Thank you for choosing an e-visit.  Your e-visit answers were  reviewed by a board certified advanced clinical practitioner to complete your personal care plan. Depending upon the condition, your plan could have included both over the counter or prescription medications.  Please review your pharmacy choice. Make sure the pharmacy is open so you can pick up prescription now. If there is a problem, you may contact your provider through [employer] and have the prescription routed to another pharmacy.  Your safety is important to [place]. If you have drug allergies check your prescription carefully.   For the next 24 hours you can use MyChart to ask questions about today's visit, request a non-urgent call back, or ask for a work or school excuse. You will get an email in the next two days asking about your experience. I hope that your e-visit has been valuable and will speed your recovery.  I provided 5 minutes of non face-to-face time during this encounter for chart review, medication and order placement, as well as and documentation.

## 2022-06-21 ENCOUNTER — Encounter: Payer: Self-pay | Admitting: Family Medicine

## 2022-06-21 ENCOUNTER — Ambulatory Visit (INDEPENDENT_AMBULATORY_CARE_PROVIDER_SITE_OTHER): Payer: 59 | Admitting: Family Medicine

## 2022-06-21 VITALS — BP 122/77 | HR 77 | Temp 97.5°F | Ht 70.0 in | Wt 244.7 lb

## 2022-06-21 DIAGNOSIS — R69 Illness, unspecified: Secondary | ICD-10-CM | POA: Diagnosis not present

## 2022-06-21 DIAGNOSIS — J069 Acute upper respiratory infection, unspecified: Secondary | ICD-10-CM | POA: Diagnosis not present

## 2022-06-21 DIAGNOSIS — F411 Generalized anxiety disorder: Secondary | ICD-10-CM | POA: Diagnosis not present

## 2022-06-21 DIAGNOSIS — F909 Attention-deficit hyperactivity disorder, unspecified type: Secondary | ICD-10-CM | POA: Diagnosis not present

## 2022-06-21 DIAGNOSIS — F331 Major depressive disorder, recurrent, moderate: Secondary | ICD-10-CM

## 2022-06-21 DIAGNOSIS — M061 Adult-onset Still's disease: Secondary | ICD-10-CM | POA: Insufficient documentation

## 2022-06-21 DIAGNOSIS — J310 Chronic rhinitis: Secondary | ICD-10-CM

## 2022-06-21 MED ORDER — AMPHETAMINE-DEXTROAMPHETAMINE 20 MG PO TABS: 20.0000 mg | ORAL_TABLET | Freq: Two times a day (BID) | ORAL | 0 refills | Status: DC

## 2022-06-21 MED ORDER — PREDNISONE 10 MG PO TABS: ORAL_TABLET | ORAL | 0 refills | Status: DC

## 2022-06-21 MED ORDER — BUPROPION HCL ER (XL) 150 MG PO TB24: 150.0000 mg | ORAL_TABLET | Freq: Every day | ORAL | 1 refills | Status: DC

## 2022-06-21 NOTE — Assessment & Plan Note (Signed)
Chronic, historic condition PHQ9 at 8 today. Continue Wellbutrin 141m PO daily, she admits she is tolerating this well. Refills provided today. Follow up in 3 months.

## 2022-06-21 NOTE — Assessment & Plan Note (Signed)
Acute; uncontrolled. Started additional prednisone taper today, 5m 12 day course taper this time for symptom relief. Continue current regimen of Tessalon pearl 1055mand PRN albuterol and Flonase. Will benefit from combo antihistamine and steroid nasal spray if symptoms remain. Return in 2 weeks.

## 2022-06-21 NOTE — Progress Notes (Signed)
BP 122/77   Pulse 77   Temp (!) 97.5 F (36.4 C) (Oral)   Ht '5\' 10"'$  (1.778 m)   Wt 244 lb 11.2 oz (111 kg)   SpO2 97%   BMI 35.11 kg/m    Subjective:    Patient ID: Cassandra Robbins, female    DOB: 24-Feb-1971, 52 y.o.   MRN: [id number]  HPI: [name] is a 52 y.o. female  Chief Complaint  Patient presents with   Anxiety   Depression   Cough    Has been dealing with a URI for 4 weeks, was in bed for 5 days with fever, Had 10 days of doxycycline not any better. Remains on albuterol and has been helping; feels like skin is crawling. Feels like something is stuck in her throat. Postnasal drip. Denies trouble swallowing, hx of septoplasty (scar tissue). sometimes gets nasal bleeding, using flonase helps.     UPPER RESPIRATORY TRACT INFECTION 4 weeks. COVID -, sinus pressure.  Worst symptom: Sinus pressure Fever: yes it is resolved now Cough: yes Shortness of breath: yes Wheezing: no Chest pain: no Chest tightness: yes Chest congestion: yes Nasal congestion: yes Runny nose: yes Post nasal drip: yes Sneezing: no Sore throat: no Swollen glands: no Sinus pressure: yes Headache: yes Face pain: yes Toothache: no Ear pain: None Ear pressure:Yes, feel stopped up Eyes red/itching: None Eye drainage/crusting: yes, crusting in the morning Vomiting: no Rash: yes, resolved, lasted 4 days after traveling to [place].  Fatigue: yes Sick contacts:  Unknown d/t travel on plane with x2 people who were  sick.  Strep contacts: no  Context: better Recurrent sinusitis: no Relief with OTC cold/cough medications:  Nyquil, nettie pot, nasal saline rinse,    Treatments attempted: cold/sinus, mucinex, and antibiotics : Doxy for 10 days, Azithromycin for 5 days; no relief, Urgent care; prednisone for 6 days; tessalon pearl, helped, promethazine DM, helped.   ANXIETY/STRESS Duration:controlled Anxious mood: yes  Excessive worrying: yes Irritability: yes  Sweating: no Nausea:  no Palpitations:yes Hyperventilation: yes Panic attacks: no Agoraphobia: no  Obscessions/compulsions: no Depressed mood: yes    06/21/2022    9:56 AM 03/19/2022    9:35 AM 02/05/2022    9:12 AM 01/08/2022   10:07 AM 02/02/2021    8:39 AM  Depression screen PHQ 2/9  Decreased Interest '1 1 1 3 '$ 0  Down, Depressed, Hopeless '1 1 1 3 '$ 0  PHQ - 2 Score '2 2 2 6 '$ 0  Altered sleeping '1 1 2 2   '$ Tired, decreased energy '3 1 2 3   '$ Change in appetite '1 1 2 2   '$ Feeling bad or failure about yourself  0 1 1 0   Trouble concentrating 0 '1 2 2   '$ Moving slowly or fidgety/restless 1 0 2 1   Suicidal thoughts 0 0 1 0   PHQ-9 Score '8 7 14 16   '$ Difficult doing work/chores  Somewhat difficult Somewhat difficult Somewhat difficult    Anhedonia: no Weight changes: no Insomnia: no   Hypersomnia: no Fatigue/loss of energy: yes Feelings of worthlessness: no Feelings of guilt: no Impaired concentration/indecisiveness: no Suicidal ideations: no  Crying spells: no Recent Stressors/Life Changes: Yes, cold symptoms   Relationship problems: no   Family stress: no     Financial stress: no    Job stress: no    Recent death/loss: no   DEPRESSION PH Q score 8; moderate depression. Taking Wellbutrin daily.  Mood status: stable Satisfied with current treatment?:  yes Symptom severity: mild  Duration of current treatment : chronic Side effects: no Medication compliance: excellent compliance Psychotherapy/counseling: Yes, in the past.  Depressed mood: no Anxious mood: yes Anhedonia: no Significant weight loss or gain: no Insomnia: no  None Fatigue: yes Feelings of worthlessness or guilt: no Impaired concentration/indecisiveness: no Suicidal ideations: no Hopelessness: no Crying spells: no    06/21/2022    9:56 AM 03/19/2022    9:35 AM 02/05/2022    9:12 AM 01/08/2022   10:07 AM 02/02/2021    8:39 AM  Depression screen PHQ 2/9  Decreased Interest '1 1 1 3 '$ 0  Down, Depressed, Hopeless '1 1 1 3 '$ 0  PHQ -  2 Score '2 2 2 6 '$ 0  Altered sleeping '1 1 2 2   '$ Tired, decreased energy '3 1 2 3   '$ Change in appetite '1 1 2 2   '$ Feeling bad or failure about yourself  0 1 1 0   Trouble concentrating 0 '1 2 2   '$ Moving slowly or fidgety/restless 1 0 2 1   Suicidal thoughts 0 0 1 0   PHQ-9 Score '8 7 14 16   '$ Difficult doing work/chores  Somewhat difficult Somewhat difficult Somewhat difficult      Relevant past medical, surgical, family and social history reviewed and updated as indicated. Interim medical history since our last visit reviewed. Allergies and medications reviewed and updated.  Review of Systems  Constitutional:  Positive for fatigue. Negative for chills and fever.  HENT:  Positive for congestion, postnasal drip, rhinorrhea, sinus pressure and sinus pain. Negative for ear pain, facial swelling, sneezing, sore throat and trouble swallowing.   Respiratory:  Positive for cough, chest tightness and shortness of breath.   Cardiovascular:  Negative for chest pain and leg swelling.  Psychiatric/Behavioral:  Positive for agitation. Negative for confusion, dysphoric mood and sleep disturbance. The patient is nervous/anxious. The patient is not hyperactive.     Per HPI unless specifically indicated above     Objective:    BP 122/77   Pulse 77   Temp (!) 97.5 F (36.4 C) (Oral)   Ht '5\' 10"'$  (1.778 m)   Wt 244 lb 11.2 oz (111 kg)   SpO2 97%   BMI 35.11 kg/m   Wt Readings from Last 3 Encounters:  06/21/22 244 lb 11.2 oz (111 kg)  06/12/22 245 lb 2.4 oz (111.2 kg)  03/19/22 245 lb 3.2 oz (111.2 kg)    Physical Exam Vitals and nursing note reviewed.  Constitutional:      General: She is not in acute distress.    Appearance: Normal appearance. She is obese. She is not ill-appearing, toxic-appearing or diaphoretic.  HENT:     Head: Normocephalic and atraumatic.     Right Ear: Tympanic membrane, ear canal and external ear normal. There is no impacted cerumen.     Left Ear: Tympanic membrane, ear  canal and external ear normal. There is no impacted cerumen.     Nose: Congestion and rhinorrhea present.     Mouth/Throat:     Mouth: Mucous membranes are moist.     Pharynx: Oropharynx is clear. No oropharyngeal exudate or posterior oropharyngeal erythema.  Eyes:     General: No scleral icterus.       Right eye: No discharge.        Left eye: No discharge.     Extraocular Movements: Extraocular movements intact.     Conjunctiva/sclera: Conjunctivae normal.     Pupils: Pupils  are equal, round, and reactive to light.  Neck:     Vascular: No carotid bruit.  Cardiovascular:     Rate and Rhythm: Normal rate and regular rhythm.     Pulses: Normal pulses.     Heart sounds: Normal heart sounds. No murmur heard.    No friction rub. No gallop.  Pulmonary:     Effort: Pulmonary effort is normal. No respiratory distress.     Breath sounds: No stridor. Wheezing present. No rhonchi or rales.  Chest:     Chest wall: No tenderness.  Musculoskeletal:        General: Normal range of motion.     Cervical back: Normal range of motion and neck supple. No rigidity. No muscular tenderness.  Lymphadenopathy:     Cervical: Cervical adenopathy present.  Skin:    General: Skin is warm and dry.     Capillary Refill: Capillary refill takes less than 2 seconds.     Coloration: Skin is not jaundiced or pale.     Findings: No bruising, erythema, lesion or rash.  Neurological:     General: No focal deficit present.     Mental Status: She is alert and oriented to person, place, and time. Mental status is at baseline.     Cranial Nerves: No cranial nerve deficit.     Sensory: No sensory deficit.     Motor: No weakness.     Coordination: Coordination normal.     Gait: Gait normal.     Deep Tendon Reflexes: Reflexes normal.  Psychiatric:        Mood and Affect: Mood normal.        Behavior: Behavior normal.        Thought Content: Thought content normal.        Judgment: Judgment normal.     Results  for orders placed or performed during the hospital encounter of 02/21/22  Pregnancy, urine POC  Result Value Ref Range   Preg Test, Ur NEGATIVE NEGATIVE      Assessment & Plan:   Problem List Items Addressed This Visit       Respiratory   Non-allergic rhinitis - Primary    Acute; uncontrolled. Started additional prednisone taper today, '60mg'$  12 day course taper this time for symptom relief. Continue current regimen of Tessalon pearl '100mg'$  and PRN albuterol and Flonase. Will benefit from combo antihistamine and steroid nasal spray if symptoms remain. Return in 2 weeks.        Relevant Medications   predniSONE (DELTASONE) 10 MG tablet     Other   Adult ADHD    Under good control on current regimen. Continue current regimen. Continue to monitor. Call with any concerns. Refills given for 3 months. Follow up 3 months.       Relevant Medications   buPROPion (WELLBUTRIN XL) 150 MG 24 hr tablet   amphetamine-dextroamphetamine (ADDERALL) 20 MG tablet (Start on 07/11/2022)   amphetamine-dextroamphetamine (ADDERALL) 20 MG tablet (Start on 08/10/2022)   amphetamine-dextroamphetamine (ADDERALL) 20 MG tablet (Start on 09/09/2022)   Moderate recurrent major depression ([place])    Chronic, historic condition PHQ9 at 8 today. Continue Wellbutrin '150mg'$  PO daily, she admits she is tolerating this well. Refills provided today. Follow up in 3 months.       Relevant Medications   buPROPion (WELLBUTRIN XL) 150 MG 24 hr tablet   GAD (generalized anxiety disorder)    Under good control on current regimen. Continue current regimen. Continue to monitor. Call with any  concerns. Refills given.        Relevant Medications   buPROPion (WELLBUTRIN XL) 150 MG 24 hr tablet     Follow up plan: Return in about 2 weeks (around 07/05/2022) for follow up lungs.

## 2022-06-21 NOTE — Progress Notes (Signed)
BP 122/77   Pulse 77   Temp (!) 97.5 F (36.4 C) (Oral)   Ht '5\' 10"'$  (1.778 m)   Wt 244 lb 11.2 oz (111 kg)   SpO2 97%   BMI 35.11 kg/m    Subjective:    Patient ID: Cassandra Robbins, female    DOB: 26-Oct-1970, 52 y.o.   MRN: [id number]  HPI: Cassandra Robbins is a 52 y.o. female  Chief Complaint  Patient presents with   Anxiety   Depression   Cough   UPPER RESPIRATORY TRACT INFECTION Duration: about a month Worst symptom: cough Fever: yes Cough: yes Shortness of breath: yes Wheezing: no Chest pain: no Chest tightness: yes Chest congestion: yes Nasal congestion: yes Runny nose: yes Post nasal drip: yes Sneezing: no Sore throat: yes Swollen glands: no Sinus pressure: yes Headache: yes Face pain: yes Toothache: no Ear pain: no  Ear pressure: yes bilateral Eyes red/itching:no Eye drainage/crusting: yes  Vomiting: no Rash: no Fatigue: yes Sick contacts: yes Strep contacts: no  Context: stable Recurrent sinusitis: no Relief with OTC cold/cough medications: no  Treatments attempted: doxycycline, azithromycin, albuterol, prednisone   ANXIETY/DEPRESSION Duration: Chronic Status:controlled Anxious mood: yes  Excessive worrying: yes Irritability: no  Sweating: no Nausea: no Palpitations:no Hyperventilation: no Panic attacks: no Agoraphobia: no  Obscessions/compulsions: no Depressed mood: yes    06/21/2022    9:56 AM 03/19/2022    9:35 AM 02/05/2022    9:12 AM 01/08/2022   10:07 AM 02/02/2021    8:39 AM  Depression screen PHQ 2/9  Decreased Interest '1 1 1 3 '$ 0  Down, Depressed, Hopeless '1 1 1 3 '$ 0  PHQ - 2 Score '2 2 2 6 '$ 0  Altered sleeping '1 1 2 2   '$ Tired, decreased energy '3 1 2 3   '$ Change in appetite '1 1 2 2   '$ Feeling bad or failure about yourself  0 1 1 0   Trouble concentrating 0 '1 2 2   '$ Moving slowly or fidgety/restless 1 0 2 1   Suicidal thoughts 0 0 1 0   PHQ-9 Score '8 7 14 16   '$ Difficult doing work/chores  Somewhat difficult Somewhat  difficult Somewhat difficult       06/21/2022    9:57 AM 03/19/2022    9:35 AM 02/05/2022    9:13 AM 01/08/2022   10:09 AM  GAD 7 : Generalized Anxiety Score  Nervous, Anxious, on Edge '1 1 1 2  '$ Control/stop worrying '1 1 1 '$ 0  Worry too much - different things '1 1 1 '$ 0  Trouble relaxing 1 0 1 3  Restless 1 0 1 1  Easily annoyed or irritable '1 1 2 3  '$ Afraid - awful might happen 1 1  0  Total GAD 7 Score '7 5  9  '$ Anxiety Difficulty Somewhat difficult  Somewhat difficult Somewhat difficult   Anhedonia: no Weight changes: no Insomnia: no   Hypersomnia: yes Fatigue/loss of energy: yes Feelings of worthlessness: no Feelings of guilt: no Impaired concentration/indecisiveness: no Suicidal ideations: no  Crying spells: no Recent Stressors/Life Changes: no   Relationship problems: no   Family stress: no     Financial stress: no    Job stress: no    Recent death/loss: no  ADHD FOLLOW UP ADHD status: controlled Satisfied with current therapy: yes Medication compliance:  excellent compliance Controlled substance contract: yes Previous psychiatry evaluation: no Previous medications: yes    Taking meds on weekends/vacations: yes Work/school performance:  excellent Difficulty sustaining attention/completing tasks: no Distracted by extraneous stimuli: no Does not listen when spoken to: no  Fidgets with hands or feet: no Unable to stay in seat: no Blurts out/interrupts others: no ADHD Medication Side Effects: no    Decreased appetite: no    Headache: no    Sleeping disturbance pattern: no    Irritability: no    Rebound effects (worse than baseline) off medication: no    Anxiousness: no    Dizziness: no    Tics: no   Relevant past medical, surgical, family and social history reviewed and updated as indicated. Interim medical history since our last visit reviewed. Allergies and medications reviewed and updated.  Review of Systems  Constitutional:  Positive for fatigue. Negative  for activity change, appetite change, chills, diaphoresis, fever and unexpected weight change.  HENT:  Positive for congestion, postnasal drip and rhinorrhea. Negative for dental problem, drooling, ear discharge, ear pain, facial swelling, hearing loss, mouth sores, nosebleeds, sinus pressure, sinus pain, sneezing, sore throat, tinnitus, trouble swallowing and voice change.   Eyes: Negative.   Respiratory:  Positive for cough, shortness of breath and wheezing. Negative for apnea, choking, chest tightness and stridor.   Cardiovascular: Negative.   Gastrointestinal: Negative.   Musculoskeletal: Negative.   Psychiatric/Behavioral: Negative.      Per HPI unless specifically indicated above     Objective:    BP 122/77   Pulse 77   Temp (!) 97.5 F (36.4 C) (Oral)   Ht '5\' 10"'$  (1.778 m)   Wt 244 lb 11.2 oz (111 kg)   SpO2 97%   BMI 35.11 kg/m   Wt Readings from Last 3 Encounters:  06/21/22 244 lb 11.2 oz (111 kg)  06/12/22 245 lb 2.4 oz (111.2 kg)  03/19/22 245 lb 3.2 oz (111.2 kg)    Physical Exam Vitals and nursing note reviewed.  Constitutional:      General: She is not in acute distress.    Appearance: Normal appearance. She is obese. She is not ill-appearing, toxic-appearing or diaphoretic.  HENT:     Head: Normocephalic and atraumatic.     Right Ear: Tympanic membrane, ear canal and external ear normal.     Left Ear: Tympanic membrane, ear canal and external ear normal.     Nose: Congestion present. No rhinorrhea.     Mouth/Throat:     Mouth: Mucous membranes are moist.     Pharynx: Oropharynx is clear. No oropharyngeal exudate or posterior oropharyngeal erythema.  Eyes:     General: No scleral icterus.       Right eye: No discharge.        Left eye: No discharge.     Extraocular Movements: Extraocular movements intact.     Conjunctiva/sclera: Conjunctivae normal.     Pupils: Pupils are equal, round, and reactive to light.  Cardiovascular:     Rate and Rhythm: Normal  rate and regular rhythm.     Pulses: Normal pulses.     Heart sounds: Normal heart sounds. No murmur heard.    No friction rub. No gallop.  Pulmonary:     Effort: Pulmonary effort is normal. No respiratory distress.     Breath sounds: No stridor. Wheezing (on forced expiration) present. No rhonchi or rales.  Chest:     Chest wall: No tenderness.  Musculoskeletal:        General: Normal range of motion.     Cervical back: Normal range of motion and neck supple.  Skin:  General: Skin is warm and dry.     Capillary Refill: Capillary refill takes less than 2 seconds.     Coloration: Skin is not jaundiced or pale.     Findings: No bruising, erythema, lesion or rash.  Neurological:     General: No focal deficit present.     Mental Status: She is alert and oriented to person, place, and time. Mental status is at baseline.  Psychiatric:        Mood and Affect: Mood normal.        Behavior: Behavior normal.        Thought Content: Thought content normal.        Judgment: Judgment normal.     Results for orders placed or performed during the hospital encounter of 02/21/22  Pregnancy, urine POC  Result Value Ref Range   Preg Test, Ur NEGATIVE NEGATIVE      Assessment & Plan:   Problem List Items Addressed This Visit       Respiratory   URI (upper respiratory infection) - Primary    Acute; uncontrolled. Started additional prednisone taper today, '60mg'$  12 day course taper this time for symptom relief. Continue current regimen of Tessalon pearl '100mg'$  and PRN albuterol and Flonase. Will benefit from combo antihistamine and steroid nasal spray if symptoms remain. Return in 2 weeks.        Relevant Medications   predniSONE (DELTASONE) 10 MG tablet     Other   Adult ADHD    Under good control on current regimen. Continue current regimen. Continue to monitor. Call with any concerns. Refills given for 3 months. Follow up 3 months.       Relevant Medications   buPROPion (WELLBUTRIN  XL) 150 MG 24 hr tablet   amphetamine-dextroamphetamine (ADDERALL) 20 MG tablet (Start on 07/11/2022)   amphetamine-dextroamphetamine (ADDERALL) 20 MG tablet (Start on 08/10/2022)   amphetamine-dextroamphetamine (ADDERALL) 20 MG tablet (Start on 09/09/2022)   Moderate recurrent major depression ([place])    Chronic, historic condition PHQ9 at 8 today. Continue Wellbutrin '150mg'$  PO daily, she admits she is tolerating this well. Refills provided today. Follow up in 3 months.       Relevant Medications   buPROPion (WELLBUTRIN XL) 150 MG 24 hr tablet   GAD (generalized anxiety disorder)    Under good control on current regimen. Continue current regimen. Continue to monitor. Call with any concerns. Refills given.        Relevant Medications   buPROPion (WELLBUTRIN XL) 150 MG 24 hr tablet     Follow up plan: Return in about 2 weeks (around 07/05/2022) for follow up lungs.

## 2022-06-21 NOTE — Assessment & Plan Note (Signed)
Under good control on current regimen. Continue current regimen. Continue to monitor. Call with any concerns. Refills given for 3 months. Follow up 3 months.    

## 2022-06-21 NOTE — Assessment & Plan Note (Signed)
Under good control on current regimen. Continue current regimen. Continue to monitor. Call with any concerns. Refills given.   

## 2022-06-26 ENCOUNTER — Encounter: Payer: Self-pay | Admitting: Family Medicine

## 2022-06-29 DIAGNOSIS — Z419 Encounter for procedure for purposes other than remedying health state, unspecified: Secondary | ICD-10-CM | POA: Diagnosis not present

## 2022-07-09 ENCOUNTER — Encounter: Payer: Self-pay | Admitting: Family Medicine

## 2022-07-09 ENCOUNTER — Ambulatory Visit (INDEPENDENT_AMBULATORY_CARE_PROVIDER_SITE_OTHER): Payer: 59 | Admitting: Family Medicine

## 2022-07-09 VITALS — BP 122/81 | HR 91 | Temp 98.1°F | Ht 70.0 in | Wt 248.8 lb

## 2022-07-09 DIAGNOSIS — R0602 Shortness of breath: Secondary | ICD-10-CM | POA: Diagnosis not present

## 2022-07-09 DIAGNOSIS — R061 Stridor: Secondary | ICD-10-CM | POA: Diagnosis not present

## 2022-07-09 DIAGNOSIS — Z8639 Personal history of other endocrine, nutritional and metabolic disease: Secondary | ICD-10-CM | POA: Diagnosis not present

## 2022-07-09 DIAGNOSIS — Z808 Family history of malignant neoplasm of other organs or systems: Secondary | ICD-10-CM | POA: Diagnosis not present

## 2022-07-09 DIAGNOSIS — R053 Chronic cough: Secondary | ICD-10-CM | POA: Diagnosis not present

## 2022-07-09 MED ORDER — BUDESONIDE-FORMOTEROL FUMARATE 160-4.5 MCG/ACT IN AERO: 2.0000 | INHALATION_SPRAY | Freq: Two times a day (BID) | RESPIRATORY_TRACT | 3 refills | Status: DC

## 2022-07-09 MED ORDER — KETOCONAZOLE 2 % EX CREA: 1.0000 | TOPICAL_CREAM | Freq: Every day | CUTANEOUS | 0 refills | Status: DC

## 2022-07-09 NOTE — Progress Notes (Signed)
BP 122/81   Pulse 91   Temp 98.1 F (36.7 C) (Oral)   Ht '5\' 10"'$  (1.778 m)   Wt 248 lb 12.8 oz (112.9 kg)   SpO2 97%   BMI 35.70 kg/m    Subjective:    Patient ID: Cassandra Robbins, female    DOB: 1971/02/25, 52 y.o.   MRN: [id number]  HPI: Cassandra Robbins is a 52 y.o. female  No chief complaint on file.  SHORTNESS OF BREATH- cough is better. Feels like she is short of breath and that something is getting stuck in her throat Duration: chronic Onset: sudden Description of breathing discomfort: tight in her throat Severity: moderate Frequency: several times a day Related to exertion: yes Cough: yes Chest tightness: yes Wheezing: yes Fevers: no Chest pain: no Palpitations: no  Nausea: no Diaphoresis: no Deconditioning: no Status: stable  Relevant past medical, surgical, family and social history reviewed and updated as indicated. Interim medical history since our last visit reviewed. Allergies and medications reviewed and updated.  Review of Systems  Constitutional:  Positive for diaphoresis. Negative for activity change, appetite change, chills, fatigue, fever and unexpected weight change.  HENT:  Negative for congestion, dental problem, drooling, ear discharge, ear pain, facial swelling, hearing loss, mouth sores, nosebleeds, postnasal drip, rhinorrhea, sinus pressure, sinus pain, sneezing, sore throat, tinnitus, trouble swallowing and voice change.   Eyes: Negative.   Respiratory:  Positive for cough, shortness of breath and stridor. Negative for apnea, choking, chest tightness and wheezing.   Cardiovascular: Negative.   Gastrointestinal: Negative.   Skin: Negative.   Psychiatric/Behavioral: Negative.      Per HPI unless specifically indicated above     Objective:    BP 122/81   Pulse 91   Temp 98.1 F (36.7 C) (Oral)   Ht '5\' 10"'$  (1.778 m)   Wt 248 lb 12.8 oz (112.9 kg)   SpO2 97%   BMI 35.70 kg/m   Wt Readings from Last 3 Encounters:  07/09/22 248 lb  12.8 oz (112.9 kg)  06/21/22 244 lb 11.2 oz (111 kg)  06/12/22 245 lb 2.4 oz (111.2 kg)    Physical Exam Vitals and nursing note reviewed.  Constitutional:      General: She is not in acute distress.    Appearance: Normal appearance. She is obese. She is not ill-appearing, toxic-appearing or diaphoretic.  HENT:     Head: Normocephalic and atraumatic.     Right Ear: Tympanic membrane, ear canal and external ear normal. There is no impacted cerumen.     Left Ear: Tympanic membrane, ear canal and external ear normal. There is no impacted cerumen.     Nose: Nose normal. No congestion or rhinorrhea.     Mouth/Throat:     Mouth: Mucous membranes are moist.     Pharynx: Oropharynx is clear. No oropharyngeal exudate or posterior oropharyngeal erythema.  Eyes:     General: No scleral icterus.       Right eye: No discharge.        Left eye: No discharge.     Extraocular Movements: Extraocular movements intact.     Conjunctiva/sclera: Conjunctivae normal.     Pupils: Pupils are equal, round, and reactive to light.  Neck:     Comments: R side of thyroid slightly larger Cardiovascular:     Rate and Rhythm: Normal rate and regular rhythm.     Pulses: Normal pulses.     Heart sounds: Normal heart sounds. No murmur heard.  No friction rub. No gallop.  Pulmonary:     Effort: Pulmonary effort is normal. No respiratory distress.     Breath sounds: Normal breath sounds. No stridor. No wheezing, rhonchi or rales.  Chest:     Chest wall: No tenderness.  Musculoskeletal:        General: Normal range of motion.     Cervical back: Normal range of motion and neck supple.  Skin:    General: Skin is warm and dry.     Capillary Refill: Capillary refill takes less than 2 seconds.     Coloration: Skin is not jaundiced or pale.     Findings: No bruising, erythema, lesion or rash.  Neurological:     General: No focal deficit present.     Mental Status: She is alert and oriented to person, place, and  time. Mental status is at baseline.  Psychiatric:        Mood and Affect: Mood normal.        Behavior: Behavior normal.        Thought Content: Thought content normal.        Judgment: Judgment normal.     Results for orders placed or performed during the hospital encounter of 02/21/22  Pregnancy, urine POC  Result Value Ref Range   Preg Test, Ur NEGATIVE NEGATIVE      Assessment & Plan:   Problem List Items Addressed This Visit   None Visit Diagnoses     SOB (shortness of breath)    -  Primary   Arlyce Harman shows mild restriction. Will start her on symbicort and obtain CT chest to r/o mass. Await results.   Relevant Orders   Spirometry with graph (Completed)   CT Chest Wo Contrast   Stridor       Location seems to be in her throat, not chest- will refer to ENT. Continue to monitor.   Relevant Orders   Ambulatory referral to ENT   History of thyroid nodule       Will obatin US of thyroid. Await results.   Relevant Orders   US THYROID   Family history of nasopharyngeal cancer       Referral to ENT placed today. Await their input.   Relevant Orders   Ambulatory referral to ENT   Chronic cough       Will treat with symbicort, check CT chest and refer to ENT. Await results. Recheck 1 month.   Relevant Orders   CT Chest Wo Contrast        Follow up plan: Return in 4 weeks (on 08/06/2022).

## 2022-07-11 ENCOUNTER — Encounter: Payer: Self-pay | Admitting: Family Medicine

## 2022-07-15 ENCOUNTER — Encounter: Payer: Self-pay | Admitting: Family Medicine

## 2022-07-17 ENCOUNTER — Telehealth: Payer: Self-pay | Admitting: Family Medicine

## 2022-07-17 NOTE — Telephone Encounter (Signed)
Please get auth.

## 2022-07-17 NOTE — Telephone Encounter (Signed)
Copied from [place] [id number]. Topic: General - Other >> Jul 17, 2022 10:23 AM Oley Balm A wrote: Reason for CRM: Melissa with [facility] states pt is scheduled for a CT Chest and Ultra Sound on 07/19/22. The CT scan is needing authorization with the pt's [facility]. She see a request was sent but it was not sent to their office for the authorization.  Please call Melissa back.  [phone] ext [phone]

## 2022-07-19 ENCOUNTER — Ambulatory Visit: Payer: 59

## 2022-07-19 ENCOUNTER — Ambulatory Visit
Admission: RE | Admit: 2022-07-19 | Discharge: 2022-07-19 | Disposition: A | Discharge status: Home / Self Care | Payer: 59 | Source: Ambulatory Visit | Attending: Family Medicine | Admitting: Family Medicine

## 2022-07-19 DIAGNOSIS — E042 Nontoxic multinodular goiter: Secondary | ICD-10-CM | POA: Diagnosis not present

## 2022-07-19 DIAGNOSIS — Z8639 Personal history of other endocrine, nutritional and metabolic disease: Secondary | ICD-10-CM | POA: Insufficient documentation

## 2022-07-25 ENCOUNTER — Telehealth: Payer: Self-pay | Admitting: Family Medicine

## 2022-07-25 NOTE — Telephone Encounter (Signed)
Called for peer-to-peer Auth# [id number] Exp 01/21/23  Can we please get CT scheduled?

## 2022-07-26 ENCOUNTER — Ambulatory Visit: Payer: 59

## 2022-07-27 ENCOUNTER — Ambulatory Visit
Admission: RE | Admit: 2022-07-27 | Discharge: 2022-07-27 | Disposition: A | Discharge status: Home / Self Care | Payer: 59 | Source: Ambulatory Visit | Attending: Family Medicine | Admitting: Family Medicine

## 2022-07-27 DIAGNOSIS — R053 Chronic cough: Secondary | ICD-10-CM

## 2022-07-27 DIAGNOSIS — R0602 Shortness of breath: Secondary | ICD-10-CM | POA: Diagnosis not present

## 2022-07-30 DIAGNOSIS — Z419 Encounter for procedure for purposes other than remedying health state, unspecified: Secondary | ICD-10-CM | POA: Diagnosis not present

## 2022-08-06 ENCOUNTER — Encounter: Payer: Self-pay | Admitting: Family Medicine

## 2022-08-06 ENCOUNTER — Ambulatory Visit (INDEPENDENT_AMBULATORY_CARE_PROVIDER_SITE_OTHER): Payer: 59 | Admitting: Family Medicine

## 2022-08-06 VITALS — BP 112/75 | HR 84 | Temp 98.7°F | Wt 251.9 lb

## 2022-08-06 DIAGNOSIS — R0602 Shortness of breath: Secondary | ICD-10-CM

## 2022-08-06 DIAGNOSIS — B372 Candidiasis of skin and nail: Secondary | ICD-10-CM

## 2022-08-06 MED ORDER — FLUCONAZOLE 100 MG PO TABS: 100.0000 mg | ORAL_TABLET | Freq: Every day | ORAL | 0 refills | Status: DC

## 2022-08-06 NOTE — Patient Instructions (Signed)
Dr. Andee Poles [address], [place], [place] [address] Phone: [phone]

## 2022-08-06 NOTE — Progress Notes (Signed)
BP 112/75   Pulse 84   Temp 98.7 F (37.1 C) (Oral)   Wt 251 lb 14.4 oz (114.3 kg)   SpO2 97%   BMI 36.14 kg/m    Subjective:    Patient ID: Cassandra Robbins, female    DOB: 1971-04-22, 52 y.o.   MRN: [id number]  HPI: Cassandra Robbins is a 52 y.o. female  Chief Complaint  Patient presents with   Shortness of Breath   Feels like she has something catching in her throat. She notes that she feels like she has to work to take a deep breath. She notes that her symbicort has made a very very slight difference, but nothing significant. Still feels like she's stridorous and having issues with breathing in her neck. CT of chest and thyroid US were normal. No other concerns or complaints at this time.   Relevant past medical, surgical, family and social history reviewed and updated as indicated. Interim medical history since our last visit reviewed. Allergies and medications reviewed and updated.  Review of Systems  Constitutional: Negative.   HENT:  Negative for congestion, dental problem, drooling, ear discharge, ear pain, facial swelling, hearing loss, mouth sores, nosebleeds, postnasal drip, rhinorrhea, sinus pressure, sinus pain, sneezing, sore throat, tinnitus, trouble swallowing and voice change.   Respiratory:  Positive for chest tightness, shortness of breath and stridor. Negative for apnea, cough, choking and wheezing.   Cardiovascular: Negative.   Gastrointestinal: Negative.   Musculoskeletal: Negative.   Neurological: Negative.   Psychiatric/Behavioral: Negative.      Per HPI unless specifically indicated above     Objective:    BP 112/75   Pulse 84   Temp 98.7 F (37.1 C) (Oral)   Wt 251 lb 14.4 oz (114.3 kg)   SpO2 97%   BMI 36.14 kg/m   Wt Readings from Last 3 Encounters:  08/06/22 251 lb 14.4 oz (114.3 kg)  07/09/22 248 lb 12.8 oz (112.9 kg)  06/21/22 244 lb 11.2 oz (111 kg)    Physical Exam Vitals and nursing note reviewed.  Constitutional:       General: She is not in acute distress.    Appearance: Normal appearance. She is well-developed and normal weight. She is not ill-appearing, toxic-appearing or diaphoretic.  HENT:     Head: Normocephalic and atraumatic.     Right Ear: External ear normal.     Left Ear: External ear normal.     Nose: Nose normal.     Mouth/Throat:     Mouth: Mucous membranes are moist.     Pharynx: Oropharynx is clear.  Eyes:     General: No scleral icterus.       Right eye: No discharge.        Left eye: No discharge.     Extraocular Movements: Extraocular movements intact.     Conjunctiva/sclera: Conjunctivae normal.     Pupils: Pupils are equal, round, and reactive to light.  Cardiovascular:     Rate and Rhythm: Normal rate and regular rhythm.     Pulses: Normal pulses.     Heart sounds: Normal heart sounds. No murmur heard.    No friction rub. No gallop.  Pulmonary:     Effort: Pulmonary effort is normal. No respiratory distress.     Breath sounds: Normal breath sounds. No stridor. No wheezing, rhonchi or rales.  Chest:     Chest wall: No tenderness.  Musculoskeletal:        General: Normal range of  motion.     Cervical back: Normal range of motion and neck supple.  Skin:    General: Skin is warm and dry.     Capillary Refill: Capillary refill takes less than 2 seconds.     Coloration: Skin is not jaundiced or pale.     Findings: Erythema (red, angry spot under L breast) present. No bruising, lesion or rash.  Neurological:     General: No focal deficit present.     Mental Status: She is alert and oriented to person, place, and time. Mental status is at baseline.  Psychiatric:        Mood and Affect: Mood normal.        Behavior: Behavior normal.        Thought Content: Thought content normal.        Judgment: Judgment normal.     Results for orders placed or performed during the hospital encounter of 02/21/22  Pregnancy, urine POC  Result Value Ref Range   Preg Test, Ur NEGATIVE  NEGATIVE      Assessment & Plan:   Problem List Items Addressed This Visit   None Visit Diagnoses     SOB (shortness of breath)    -  Primary   Not siginicantly better with the symbicort. Still feeling it in her neck. Will get her into see ENT and await their input. Continue symbicort for now.   Yeast dermatitis       Will treat with oral diflucan. Call with any concerns. Continue to monitor.   Relevant Medications   fluconazole (DIFLUCAN) 100 MG tablet        Follow up plan: Return if symptoms worsen or fail to improve.

## 2022-08-09 DIAGNOSIS — K219 Gastro-esophageal reflux disease without esophagitis: Secondary | ICD-10-CM | POA: Diagnosis not present

## 2022-08-09 DIAGNOSIS — R0602 Shortness of breath: Secondary | ICD-10-CM | POA: Diagnosis not present

## 2022-08-09 DIAGNOSIS — R0683 Snoring: Secondary | ICD-10-CM | POA: Diagnosis not present

## 2022-08-29 DIAGNOSIS — Z419 Encounter for procedure for purposes other than remedying health state, unspecified: Secondary | ICD-10-CM | POA: Diagnosis not present

## 2022-09-06 ENCOUNTER — Ambulatory Visit: Payer: 59 | Attending: Cardiology | Admitting: Cardiology

## 2022-09-06 ENCOUNTER — Encounter: Payer: Self-pay | Admitting: Cardiology

## 2022-09-06 VITALS — BP 139/88 | HR 84 | Ht 70.0 in | Wt 251.2 lb

## 2022-09-06 DIAGNOSIS — R072 Precordial pain: Secondary | ICD-10-CM | POA: Insufficient documentation

## 2022-09-06 DIAGNOSIS — I2089 Other forms of angina pectoris: Secondary | ICD-10-CM | POA: Insufficient documentation

## 2022-09-06 DIAGNOSIS — Z6835 Body mass index (BMI) 35.0-35.9, adult: Secondary | ICD-10-CM | POA: Diagnosis not present

## 2022-09-06 DIAGNOSIS — E782 Mixed hyperlipidemia: Secondary | ICD-10-CM | POA: Diagnosis not present

## 2022-09-06 DIAGNOSIS — R0609 Other forms of dyspnea: Secondary | ICD-10-CM | POA: Diagnosis not present

## 2022-09-06 DIAGNOSIS — E785 Hyperlipidemia, unspecified: Secondary | ICD-10-CM | POA: Insufficient documentation

## 2022-09-06 MED ORDER — METOPROLOL TARTRATE 100 MG PO TABS: 100.0000 mg | ORAL_TABLET | Freq: Once | ORAL | 0 refills | Status: DC

## 2022-09-06 NOTE — Assessment & Plan Note (Signed)
Last labs are from September 2023-LDL was 143.  If there is evidence of CAD from coronary CT I, would probably want to be more aggressive treating.  If it is nonocclusive we will still have the evidence of plaque burden.  Mobility determine how aggressive we need to treat from that.

## 2022-09-06 NOTE — Assessment & Plan Note (Signed)
Precordial upper chest and in the neck discomfort worse with exertion.  Referred by ENT because he did not think it was a throat related issue.  He was concerned about being a potential atypical angina.  I agree that it may need to be excluded.  I am leery with her body habitus of a Myoview stress test being inconclusive reading false positive.  GXT would not be enough data to change his decision making.  Plan: Coronary CTA with possible CT of the heart.

## 2022-09-06 NOTE — Assessment & Plan Note (Signed)
Check 2D echo to assess EF and Coronary CTA to assess for CAD

## 2022-09-06 NOTE — Assessment & Plan Note (Signed)
Atypical features that sound like they could potentially be anginal in nature with the throat discomfort and dyspnea on exertion.  I think is probably more related to URI but with family history on her father side of CAD reasonable to exclude occlusive CAD.  Plan: Evaluate with Coronary CTA possible FFR ct

## 2022-09-06 NOTE — Assessment & Plan Note (Signed)
She would like to get back into doing some exercise, the most recent several months have been difficult for her because she had just started back exercising before she gets sick.  Would like to have freedom to get back to exercising.  More plans based on results of Coronary CTA and echo.

## 2022-09-06 NOTE — Progress Notes (Signed)
Primary Care Provider: [name], DO [facility] Cardiologist: None Electrophysiologist: None  Clinic Note: Chief Complaint  Patient presents with   Shortness of Breath    Patient states that she experiences shortness of breath and fatigue. Patient had a viral illness. Meds reviewed.     ===================================  ASSESSMENT/PLAN   Problem List Items Addressed This Visit       Cardiology Problems   Hyperlipidemia (Chronic)    Last labs are from September 2023-LDL was 143.  If there is evidence of CAD from coronary CT I, would probably want to be more aggressive treating.  If it is nonocclusive we will still have the evidence of plaque burden.  Mobility determine how aggressive we need to treat from that.      Relevant Medications   metoprolol tartrate (LOPRESSOR) 100 MG tablet   Other Relevant Orders   EKG 12-Lead   Atypical angina - Primary    Atypical features that sound like they could potentially be anginal in nature with the throat discomfort and dyspnea on exertion.  I think is probably more related to URI but with family history on her father side of CAD reasonable to exclude occlusive CAD.  Plan: Evaluate with Coronary CTA possible FFR ct      Relevant Medications   metoprolol tartrate (LOPRESSOR) 100 MG tablet   Other Relevant Orders   EKG 12-Lead     Other   Precordial pain    Precordial upper chest and in the neck discomfort worse with exertion.  Referred by ENT because he did not think it was a throat related issue.  He was concerned about being a potential atypical angina.  I agree that it may need to be excluded.  I am leery with her body habitus of a Myoview stress test being inconclusive reading false positive.  GXT would not be enough data to change his decision making.  Plan: Coronary CTA with possible CT of the heart.      Relevant Orders   EKG 12-Lead   CT CORONARY MORPH W/CTA COR W/SCORE W/CA W/CM &/OR WO/CM    Dyspnea on exertion (Chronic)    Check 2D echo to assess EF and Coronary CTA to assess for CAD      Relevant Orders   EKG 12-Lead   ECHOCARDIOGRAM COMPLETE   BMI 35.0-35.9,adult (Chronic)    She would like to get back into doing some exercise, the most recent several months have been difficult for her because she had just started back exercising before she gets sick.  Would like to have freedom to get back to exercising.  More plans based on results of Coronary CTA and echo.       ===================================  HPI:    Cassandra Robbins is a 52 y.o. female who is being seen today for the evaluation of Shortness of Breath and Tachycardia at the request of Cassandra Carrow, DO &  Cassandra Robbins   Cassandra Robbins was seen by Cassandra Robbins (ENT) on August 09, 2022 with a continued complaint of shortness of breath/stridor.  This all began after having a URI Cassandra Robbins in January with chronic cough for almost 2 months.  Describes it as breathing through a filter.  Hard to take a deep breath.  Noted worsening snoring and worsening reflux.  Home sleep study recommended as well as pulmonary and cardiac evaluation.  Recent Hospitalizations: None  Reviewed  CV studies:    The following studies  were reviewed today: (if available, images/films reviewed: From Epic Chart or Care Everywhere) None:  Interval History:   Cassandra Robbins presents today for evaluation stating that ever since her URI infection back in January to February timeframe she has been just feeling progressively worse.  Tired - fatigued all of the time.  Feels like choking with SOB - worse with exertion.  No PND, orthopnea - trivial end of day dependent edema - but occasionally has had pitting edema.   Wakes up with ~ hot flushes.  Notes increased HR at rest & higher walking. Nothing irregular. No dizziness, near syncope/syncope.  NO chest tightness or pressure - notes tightness (like a softball ) in throat / neck &  not associated with increased HR.   CV Review of Symptoms (Summary): positive for - dyspnea on exertion, palpitations, rapid heart rate, shortness of breath, and not chest pain but throat/jaw pain with a sense of choking. negative for - edema, irregular heartbeat, loss of consciousness, orthopnea, or paroxysmal nocturnal dyspnea  REVIEWED OF SYSTEMS   Review of Systems  Constitutional:  Positive for malaise/fatigue. Negative for weight loss.  HENT:  Negative for congestion.   Respiratory:  Positive for cough, shortness of breath and wheezing (This is notably improved from what it had been.  She used to say that her breathing sounded like she was "snoring ").   Gastrointestinal:  Negative for blood in stool, melena, nausea and vomiting.  Genitourinary:  Negative for hematuria.  Musculoskeletal:  Positive for falls. Negative for back pain.  Neurological:  Positive for dizziness and weakness (Generalized). Negative for focal weakness.  Endo/Heme/Allergies:  Positive for environmental allergies.  Psychiatric/Behavioral:  Negative for depression and memory loss. The patient is nervous/anxious. The patient does not have insomnia.     I have reviewed and (if needed) personally updated the patient's problem list, medications, allergies, past medical and surgical history, social and family history.   PAST MEDICAL HISTORY   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HCC)    Toe fracture, right 10/08/2017    PAST SURGICAL HISTORY   Past Surgical History:  Procedure Laterality Date   COLONOSCOPY WITH PROPOFOL N/A 02/21/2022   Procedure: COLONOSCOPY WITH PROPOFOL;  Surgeon: Cassandra Reil, MD;  Location: [facility] ENDOSCOPY;  Service: Gastroenterology;  Laterality: N/A;   LAPAROSCOPIC ENDOMETRIOSIS FULGURATION   2007   SEPTOPLASTY  2005   TUBAL LIGATION      MEDICATIONS/ALLERGIES   Current Meds  Medication Sig   albuterol (VENTOLIN HFA) 108 (90 Base) MCG/ACT inhaler - hasn't used recently in > 30 d Inhale 2 puffs into the lungs every 6 (six) hours as needed for wheezing or shortness of breath.   amphetamine-dextroamphetamine (ADDERALL) 20 MG tablet Take 1 tablet (20 mg total) by mouth 2 (two) times daily.   budesonide-formoterol (SYMBICORT) 160-4.5 MCG/ACT inhaler Inhale 2 puffs into the lungs 2 (two) times daily. (Did not really help)   buPROPion (WELLBUTRIN XL) 150 MG 24 hr tablet Take 1 tablet (150 mg total) by mouth daily.   fluticasone (FLONASE) 50 MCG/ACT nasal spray Place 2 sprays into both nostrils daily.   ketoconazole (NIZORAL) 2 % cream Apply 1 Application topically daily.    Allergies  Allergen Reactions   Penicillins Anaphylaxis  Amoxicillin Rash    SOCIAL HISTORY/FAMILY HISTORY   Reviewed in Epic:   Social History   Tobacco Use   Smoking status: Never   Smokeless tobacco: Never  Vaping Use   Vaping Use: Never used  Substance Use Topics   Alcohol use: Yes    Alcohol/week: 2.0 - 4.0 standard drinks of alcohol    Types: 1 - 2 Glasses of wine, 1 - 2 Shots of liquor per week    Comment: on occasion   Drug use: No   Social History   Social History Narrative   She has been a Radiation protection practitioner for 32 years.     Family History  Problem Relation Age of Onset   Asthma Mother    Lung disease Mother    Thyroid disease Mother    Cancer Father 51   Breast cancer Neg Hx    Father - died 25 nasopharyngeal Cancer.  Multiple Paternal Uncles & 1 aunt severe CAD, but his parents lived to 93s. Most of the sibs lived to late 53s & early 8s.  Mother & 2 sisters - Pulm Fibrosis.    OBJCTIVE -PE, EKG, labs   Wt Readings from Last 3 Encounters:  09/06/22 251 lb 3.2 oz (113.9 kg)  08/06/22 251 lb 14.4 oz (114.3 kg)  07/09/22 248 lb 12.8 oz (112.9 kg)    Physical Exam: BP  139/88 (BP Location: Left Arm, Patient Position: Sitting, Cuff Size: Large)   Pulse 84   Ht 5\' 10"  (1.778 m)   Wt 251 lb 3.2 oz (113.9 kg)   SpO2 99%   BMI 36.04 kg/m  Physical Exam Vitals reviewed.  Constitutional:      General: She is not in acute distress.    Appearance: Normal appearance. She is obese. She is not toxic-appearing.  HENT:     Head: Normocephalic and atraumatic.  Neck:     Vascular: No carotid bruit.  Cardiovascular:     Rate and Rhythm: Normal rate and regular rhythm.     Pulses: Normal pulses.     Heart sounds: Normal heart sounds. No murmur heard.    No friction rub. No gallop.  Pulmonary:     Effort: Pulmonary effort is normal. No respiratory distress.     Breath sounds: Normal breath sounds. No wheezing, rhonchi or rales.  Abdominal:     General: Abdomen is flat. Bowel sounds are normal. There is no distension.     Palpations: Abdomen is soft.     Tenderness: There is no abdominal tenderness.     Comments: No HSM or bruit  Musculoskeletal:        General: No swelling. Normal range of motion.     Cervical back: Normal range of motion and neck supple.  Skin:    General: Skin is warm and dry.  Neurological:     General: No focal deficit present.     Mental Status: She is alert and oriented to person, place, and time. Mental status is at baseline.     Gait: Gait normal.  Psychiatric:        Mood and Affect: Mood normal.        Behavior: Behavior normal.        Thought Content: Thought content normal.        Judgment: Judgment normal.     Adult ECG Report  Rate: 84 ;  Rhythm: normal sinus rhythm and borderline LVH  ; Normal axis, intervals & durations.   Narrative Interpretation: borderline  Recent Labs:  reviewed.   Lab Results  Component Value Date   CHOL 225 (H) 01/08/2022   HDL 57 01/08/2022   LDLCALC 143 (H) 01/08/2022   TRIG 143 01/08/2022   CHOLHDL 3.9 01/08/2022   Lab Results  Component Value Date   CREATININE 0.72 01/08/2022    BUN 13 01/08/2022   NA 140 01/08/2022   K 4.5 01/08/2022   CL 103 01/08/2022   CO2 20 01/08/2022      Latest Ref Rng & Units 01/08/2022   11:24 AM 11/30/2020   11:26 AM 11/16/2020   11:43 AM  CBC  WBC 3.4 - 10.8 x10E3/uL 6.4  6.8  7.1   Hemoglobin 11.1 - 15.9 g/dL 29.5  62.1  30.8   Hematocrit 34.0 - 46.6 % 43.5  43.3  42.8   Platelets 150 - 450 x10E3/uL 305  328  281     Lab Results  Component Value Date   HGBA1C 6.1 (H) 01/08/2022   Lab Results  Component Value Date   TSH 1.300 01/26/2021    ================================================== I spent a total of 25 minutes with the patient spent in direct patient consultation.  Additional time spent with chart review  / charting (studies, outside notes, etc): 26 min Total Time: 51 min  Current medicines are reviewed at length with the patient today.  (+/- concerns) none  Notice: This dictation was prepared with Dragon dictation along with smart phrase technology. Any transcriptional errors that result from this process are unintentional and may not be corrected upon review.   Studies Ordered:  Orders Placed This Encounter  Procedures   CT CORONARY MORPH W/CTA COR W/SCORE W/CA W/CM &/OR WO/CM   EKG 12-Lead   ECHOCARDIOGRAM COMPLETE   Meds ordered this encounter  Medications   metoprolol tartrate (LOPRESSOR) 100 MG tablet    Sig: Take 1 tablet (100 mg total) by mouth once for 1 dose. TAKE 2 HOURS PRIOR TO YOUR PROCEDURE (CCTA)    Dispense:  1 tablet    Refill:  0    Patient Instructions / Medication Changes & Studies & Tests Ordered   Patient Instructions  Medication Instructions:  No changes at this time.   *If you need a refill on your cardiac medications before your next appointment, please call your pharmacy*   Lab Work: None  If you have labs (blood work) drawn today and your tests are completely normal, you will receive your results only by: MyChart Message (if you have MyChart) OR A paper copy in the  mail If you have any lab test that is abnormal or we need to change your treatment, we will call you to review the results.   Testing/Procedures: Your physician has requested that you have an echocardiogram. Echocardiography is a painless test that uses sound waves to create images of your heart. It provides your doctor with information about the size and shape of your heart and how well your heart's chambers and valves are working. This procedure takes approximately one hour. There are no restrictions for this procedure. Please do NOT wear cologne, perfume, aftershave, or lotions (deodorant is allowed). Please arrive 15 minutes prior to your appointment time.    Your cardiac CT will be scheduled at one of the below locations:   [facility] [address] [place], [place] [address] [phone]  Scheduled for 10/08/22 at 1:00 pm. Please arrive at 12:45 pm for registration and test prep.     Please follow  these instructions carefully (unless otherwise directed):  Hold all erectile dysfunction medications at least 3 days (72 hrs) prior to test. (Ie viagra, cialis, sildenafil, tadalafil, etc) We will administer nitroglycerin during this exam.   On the Night Before the Test: Be sure to Drink plenty of water. Do not consume any caffeinated/decaffeinated beverages or chocolate 12 hours prior to your test. Do not take any antihistamines 12 hours prior to your test.   On the Day of the Test: Drink plenty of water until 1 hour prior to the test. Do not eat any food 1 hour prior to test. You may take your regular medications prior to the test.  Take metoprolol (Lopressor) 100 mg two hours prior to test. If you take Furosemide/Hydrochlorothiazide/Spironolactone, please HOLD on the morning of the test. FEMALES- please wear underwire-free bra if available, avoid dresses & tight clothing        After the Test: Drink plenty of water. After  receiving IV contrast, you may experience a mild flushed feeling. This is normal. On occasion, you may experience a mild rash up to 24 hours after the test. This is not dangerous. If this occurs, you can take Benadryl 25 mg and increase your fluid intake. If you experience trouble breathing, this can be serious. If it is severe call 911 IMMEDIATELY. If it is mild, please call our office. If you take any of these medications: Glipizide/Metformin, Avandament, Glucavance, please do not take 48 hours after completing test unless otherwise instructed.  For non-scheduling related questions, please contact the cardiac imaging nurse navigator should you have any questions/concerns: Rockwell Alexandria, Cardiac Imaging Nurse Navigator Larey Brick, Cardiac Imaging Nurse Navigator [facility] Direct Office Dial: [phone]   For scheduling needs, including cancellations and rescheduling, please call [place], [phone].    Follow-Up: At [facility], you and your health needs are our priority.  As part of our continuing mission to provide you with exceptional heart care, we have created designated Provider Care Teams.  These Care Teams include your primary Cardiologist (physician) and Advanced Practice Providers (APPs -  Physician Assistants and Nurse Practitioners) who all work together to provide you with the care you need, when you need it.   Your next appointment:   Follow up after testing has been done.   Provider:   [name], MD       Marykay Lex, MD, MS [name], M.D., M.S. Interventional Cardiologist  [facility]   [address] [place], [place]  [address] [phone]           Fax [phone]    Thank you for choosing [facility] in [place]!!

## 2022-09-06 NOTE — Patient Instructions (Addendum)
Medication Instructions:  No changes at this time.   *If you need a refill on your cardiac medications before your next appointment, please call your pharmacy*   Lab Work: None  If you have labs (blood work) drawn today and your tests are completely normal, you will receive your results only by: MyChart Message (if you have MyChart) OR A paper copy in the mail If you have any lab test that is abnormal or we need to change your treatment, we will call you to review the results.   Testing/Procedures: Your physician has requested that you have an echocardiogram. Echocardiography is a painless test that uses sound waves to create images of your heart. It provides your doctor with information about the size and shape of your heart and how well your heart's chambers and valves are working. This procedure takes approximately one hour. There are no restrictions for this procedure. Please do NOT wear cologne, perfume, aftershave, or lotions (deodorant is allowed). Please arrive 15 minutes prior to your appointment time.    Your cardiac CT will be scheduled at one of the below locations:   [facility] [address] [place], [place] [address] [phone]  Scheduled for 10/08/22 at 1:00 pm. Please arrive at 12:45 pm for registration and test prep.     Please follow these instructions carefully (unless otherwise directed):  Hold all erectile dysfunction medications at least 3 days (72 hrs) prior to test. (Ie viagra, cialis, sildenafil, tadalafil, etc) We will administer nitroglycerin during this exam.   On the Night Before the Test: Be sure to Drink plenty of water. Do not consume any caffeinated/decaffeinated beverages or chocolate 12 hours prior to your test. Do not take any antihistamines 12 hours prior to your test.   On the Day of the Test: Drink plenty of water until 1 hour prior to the test. Do not eat any food 1 hour prior to  test. You may take your regular medications prior to the test.  Take metoprolol (Lopressor) 100 mg two hours prior to test. If you take Furosemide/Hydrochlorothiazide/Spironolactone, please HOLD on the morning of the test. FEMALES- please wear underwire-free bra if available, avoid dresses & tight clothing        After the Test: Drink plenty of water. After receiving IV contrast, you may experience a mild flushed feeling. This is normal. On occasion, you may experience a mild rash up to 24 hours after the test. This is not dangerous. If this occurs, you can take Benadryl 25 mg and increase your fluid intake. If you experience trouble breathing, this can be serious. If it is severe call 911 IMMEDIATELY. If it is mild, please call our office. If you take any of these medications: Glipizide/Metformin, Avandament, Glucavance, please do not take 48 hours after completing test unless otherwise instructed.  For non-scheduling related questions, please contact the cardiac imaging nurse navigator should you have any questions/concerns: Rockwell Alexandria, Cardiac Imaging Nurse Navigator Larey Brick, Cardiac Imaging Nurse Navigator [facility] Direct Office Dial: [phone]   For scheduling needs, including cancellations and rescheduling, please call [place], [phone].    Follow-Up: At [facility], you and your health needs are our priority.  As part of our continuing mission to provide you with exceptional heart care, we have created designated Provider Care Teams.  These Care Teams include your primary Cardiologist (physician) and Advanced Practice Providers (APPs -  Physician Assistants and Nurse Practitioners) who all work  together to provide you with the care you need, when you need it.   Your next appointment:   Follow up after testing has been done.   Provider:   [name], MD

## 2022-09-29 DIAGNOSIS — Z419 Encounter for procedure for purposes other than remedying health state, unspecified: Secondary | ICD-10-CM | POA: Diagnosis not present

## 2022-10-01 ENCOUNTER — Encounter: Payer: Self-pay | Admitting: Family Medicine

## 2022-10-01 NOTE — Telephone Encounter (Signed)
appt

## 2022-10-01 NOTE — Telephone Encounter (Signed)
Called and scheduled for an appointment on 10/02/2022 @ 2:40 pm.

## 2022-10-02 ENCOUNTER — Encounter: Payer: Self-pay | Admitting: Family Medicine

## 2022-10-02 ENCOUNTER — Ambulatory Visit (INDEPENDENT_AMBULATORY_CARE_PROVIDER_SITE_OTHER): Payer: 59 | Admitting: Family Medicine

## 2022-10-02 VITALS — BP 123/83 | HR 105 | Temp 98.4°F | Ht 70.0 in | Wt 251.4 lb

## 2022-10-02 DIAGNOSIS — H903 Sensorineural hearing loss, bilateral: Secondary | ICD-10-CM | POA: Diagnosis not present

## 2022-10-02 DIAGNOSIS — R600 Localized edema: Secondary | ICD-10-CM

## 2022-10-02 DIAGNOSIS — R0602 Shortness of breath: Secondary | ICD-10-CM | POA: Diagnosis not present

## 2022-10-02 NOTE — Progress Notes (Signed)
BP 123/83   Pulse (!) 105   Temp 98.4 F (36.9 C) (Oral)   Ht 5\' 10"  (1.778 m)   Wt 251 lb 6.4 oz (114 kg)   SpO2 97%   BMI 36.07 kg/m    Subjective:    Patient ID: Cassandra Robbins, female    DOB: 07-Jun-1970, 52 y.o.   MRN: [id number]  HPI: Cassandra Robbins is a 53 y.o. female  Chief Complaint  Patient presents with   Leg Swelling    Patient says she has been noticing some swelling in her legs. Patient says she noticed a knot in her L calf muscle. Patient says she is also noticing some discoloration in her legs. Patient says the swelling has went down since she first noticed it Saturday.    Leg Heaviness   Has been having heaviness in her legs. She notes that the swelling in her legs has been going on for several weeks. She had been traveling and had been eating more salt. Legs have been heavy and have been feeling uncomfortable. She notes that that is mainly at night. No redness. She has had a "knot" at the back of her L calf. Unsure how long it's been there. She has tried elevating her legs and that seems like it helped, but it took hours for her to go back to normal. She has had procedures done on her legs for varicose veins in the past. She has swollen in the past. She does wear compression hose when she's traveling. Her mother had lymphedema so she's anxious it's that. She denies any SOB out of the usual. She is otherwise feeling well. No other concerns or complaints at this time.   Relevant past medical, surgical, family and social history reviewed and updated as indicated. Interim medical history since our last visit reviewed. Allergies and medications reviewed and updated.  Review of Systems  Constitutional: Negative.   Respiratory: Negative.    Cardiovascular:  Positive for leg swelling. Negative for chest pain and palpitations.  Gastrointestinal: Negative.   Musculoskeletal:  Positive for myalgias. Negative for arthralgias, back pain, gait problem, joint swelling, neck  pain and neck stiffness.  Skin: Negative.   Psychiatric/Behavioral: Negative.      Per HPI unless specifically indicated above     Objective:    BP 123/83   Pulse (!) 105   Temp 98.4 F (36.9 C) (Oral)   Ht 5\' 10"  (1.778 m)   Wt 251 lb 6.4 oz (114 kg)   SpO2 97%   BMI 36.07 kg/m   Wt Readings from Last 3 Encounters:  10/02/22 251 lb 6.4 oz (114 kg)  09/06/22 251 lb 3.2 oz (113.9 kg)  08/06/22 251 lb 14.4 oz (114.3 kg)    Physical Exam Vitals and nursing note reviewed.  Constitutional:      General: She is not in acute distress.    Appearance: Normal appearance. She is not ill-appearing, toxic-appearing or diaphoretic.  HENT:     Head: Normocephalic and atraumatic.     Right Ear: External ear normal.     Left Ear: External ear normal.     Nose: Nose normal.     Mouth/Throat:     Mouth: Mucous membranes are moist.     Pharynx: Oropharynx is clear.  Eyes:     General: No scleral icterus.       Right eye: No discharge.        Left eye: No discharge.     Extraocular  Movements: Extraocular movements intact.     Conjunctiva/sclera: Conjunctivae normal.     Pupils: Pupils are equal, round, and reactive to light.  Cardiovascular:     Rate and Rhythm: Normal rate and regular rhythm.     Pulses: Normal pulses.     Heart sounds: Normal heart sounds. No murmur heard.    No friction rub. No gallop.  Pulmonary:     Effort: Pulmonary effort is normal. No respiratory distress.     Breath sounds: Normal breath sounds. No stridor. No wheezing, rhonchi or rales.  Chest:     Chest wall: No tenderness.  Musculoskeletal:        General: Normal range of motion.     Cervical back: Normal range of motion and neck supple.     Right lower leg: Edema (trace) present.     Left lower leg: Edema (trace) present.     Comments: + squeeze bilaterally  Skin:    General: Skin is warm and dry.     Capillary Refill: Capillary refill takes less than 2 seconds.     Coloration: Skin is not  jaundiced or pale.     Findings: No bruising, erythema, lesion or rash.  Neurological:     General: No focal deficit present.     Mental Status: She is alert and oriented to person, place, and time. Mental status is at baseline.  Psychiatric:        Mood and Affect: Mood normal.        Behavior: Behavior normal.        Thought Content: Thought content normal.        Judgment: Judgment normal.     Results for orders placed or performed during the hospital encounter of 02/21/22  Pregnancy, urine POC  Result Value Ref Range   Preg Test, Ur NEGATIVE NEGATIVE      Assessment & Plan:   Problem List Items Addressed This Visit   None Visit Diagnoses     Peripheral edema    -  Primary   Will check labs and [place] to r/o DVT. Compression hose, elevation and dietary management. Will get her back into vascular. Call with any concerns.   Relevant Orders   CBC with Differential/Platelet   Basic metabolic panel   B Nat Peptide   US Venous Img Lower Bilateral   Ambulatory referral to Vascular Surgery        Follow up plan: Return if symptoms worsen or fail to improve.

## 2022-10-03 LAB — CBC WITH DIFFERENTIAL/PLATELET
MCHC: 33.3 g/dL (ref 31.5–35.7)
Monocytes Absolute: 0.5 10*3/uL (ref 0.1–0.9)
Monocytes: 5 %
WBC: 8.9 10*3/uL (ref 3.4–10.8)

## 2022-10-03 LAB — BASIC METABOLIC PANEL
BUN/Creatinine Ratio: 24 — ABNORMAL HIGH (ref 9–23)
BUN: 20 mg/dL (ref 6–24)
CO2: 23 mmol/L (ref 20–29)
Calcium: 10.3 mg/dL — ABNORMAL HIGH (ref 8.7–10.2)

## 2022-10-04 ENCOUNTER — Ambulatory Visit
Admission: RE | Admit: 2022-10-04 | Discharge: 2022-10-04 | Disposition: A | Discharge status: Home / Self Care | Payer: 59 | Source: Ambulatory Visit | Attending: Family Medicine | Admitting: Family Medicine

## 2022-10-04 DIAGNOSIS — R2242 Localized swelling, mass and lump, left lower limb: Secondary | ICD-10-CM | POA: Diagnosis not present

## 2022-10-04 DIAGNOSIS — R600 Localized edema: Secondary | ICD-10-CM | POA: Insufficient documentation

## 2022-10-04 LAB — BASIC METABOLIC PANEL
Chloride: 99 mmol/L (ref 96–106)
Creatinine, Ser: 0.82 mg/dL (ref 0.57–1.00)
Glucose: 88 mg/dL (ref 70–99)
Potassium: 4 mmol/L (ref 3.5–5.2)
Sodium: 139 mmol/L (ref 134–144)
eGFR: 85 mL/min/{1.73_m2} (ref >59)

## 2022-10-04 LAB — CBC WITH DIFFERENTIAL/PLATELET
Basophils Absolute: 0.1 10*3/uL (ref 0.0–0.2)
Basos: 1 %
EOS (ABSOLUTE): 0.2 10*3/uL (ref 0.0–0.4)
Eos: 3 %
Hematocrit: 45.9 % (ref 34.0–46.6)
Hemoglobin: 15.3 g/dL (ref 11.1–15.9)
Immature Grans (Abs): 0 10*3/uL (ref 0.0–0.1)
Immature Granulocytes: 0 %
Lymphocytes Absolute: 2.4 10*3/uL (ref 0.7–3.1)
Lymphs: 27 %
MCH: 29.3 pg (ref 26.6–33.0)
MCV: 88 fL (ref 79–97)
Neutrophils Absolute: 5.7 10*3/uL (ref 1.4–7.0)
Neutrophils: 64 %
Platelets: 333 10*3/uL (ref 150–450)
RBC: 5.22 x10E6/uL (ref 3.77–5.28)
RDW: 12.4 % (ref 11.7–15.4)

## 2022-10-04 LAB — BRAIN NATRIURETIC PEPTIDE: BNP: 3.8 pg/mL (ref 0.0–100.0)

## 2022-10-05 ENCOUNTER — Telehealth (HOSPITAL_COMMUNITY): Payer: Self-pay | Admitting: Emergency Medicine

## 2022-10-05 NOTE — Progress Notes (Signed)
Contacted via MyChart   Heart failure lab returned within normal range, showing no concerns.

## 2022-10-05 NOTE — Telephone Encounter (Signed)
Reaching out to patient to offer assistance regarding upcoming cardiac imaging study; pt verbalizes understanding of appt date/time, parking situation and where to check in, pre-test NPO status and medications ordered, and verified current allergies; name and call back number provided for further questions should they arise Nakyla Bracco RN Navigator Cardiac Imaging [facility] [phone] office [phone] cell 

## 2022-10-08 ENCOUNTER — Ambulatory Visit
Admission: RE | Admit: 2022-10-08 | Discharge: 2022-10-08 | Disposition: A | Discharge status: Home / Self Care | Payer: 59 | Source: Ambulatory Visit | Attending: Cardiology | Admitting: Cardiology

## 2022-10-08 ENCOUNTER — Other Ambulatory Visit: Payer: Self-pay | Admitting: Cardiology

## 2022-10-08 DIAGNOSIS — I200 Unstable angina: Secondary | ICD-10-CM

## 2022-10-08 DIAGNOSIS — R072 Precordial pain: Secondary | ICD-10-CM

## 2022-10-08 MED ORDER — IOHEXOL 350 MG/ML SOLN
100.0000 mL | Freq: Once | INTRAVENOUS | Status: AC | PRN
  Administered 2022-10-08: 100 mL via INTRAVENOUS

## 2022-10-08 MED ORDER — NITROGLYCERIN 0.4 MG SL SUBL
0.8000 mg | SUBLINGUAL_TABLET | Freq: Once | SUBLINGUAL | Status: AC
  Administered 2022-10-08: 0.8 mg via SUBLINGUAL
  Filled 2022-10-08: qty 25

## 2022-10-08 NOTE — Progress Notes (Signed)
Patient tolerated procedure well. Ambulate w/o difficulty. Denies any lightheadedness or being dizzy. Pt denies any pain at this time. Sitting in chair, pt is encouraged to drink additional water throughout the day and reason explained to patient. Patient verbalized understanding and all questions answered. ABC intact. No further needs at this time. Discharge from procedure area w/o issues.  

## 2022-10-09 ENCOUNTER — Encounter: Payer: Self-pay | Admitting: Family Medicine

## 2022-10-09 ENCOUNTER — Encounter: Payer: Self-pay | Admitting: Cardiology

## 2022-10-11 NOTE — Progress Notes (Signed)
Office Visit    Patient Name: Cassandra Robbins Date of Encounter: 10/11/2022  Primary Care Provider:  [name], DO Primary Cardiologist:  [name], MD  Chief Complaint    52 year old female with past medical history of noncalcified CAD with dyspnea on exertion concerning for anginal equivalent, hyperlipidemia and Stills Disease. She presents today for follow up regarding her coronary CTA results.   Past Medical History    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HCC)    Toe fracture, right 10/08/2017   Past Surgical History:  Procedure Laterality Date   COLONOSCOPY WITH PROPOFOL N/A 02/21/2022   Procedure: COLONOSCOPY WITH PROPOFOL;  Surgeon: Toney Reil, MD;  Location[facility] ENDOSCOPY;  Service: Gastroenterology;  Laterality: N/A;   LAPAROSCOPIC ENDOMETRIOSIS FULGURATION  2007   SEPTOPLASTY  2005   TUBAL LIGATION     Allergies Allergies  Allergen Reactions   Penicillins Anaphylaxis   Amoxicillin Rash   Labs/Other Studies Reviewed    The following studies were reviewed today: Cardiac Studies & Procedures          CT SCANS  CT CORONARY MORPH W/CTA COR W/SCORE 10/08/2022  Narrative CLINICAL DATA:  Chest pain  EXAM: Cardiac/Coronary  CTA  TECHNIQUE: The patient was scanned on a Siemens Somatom go.Top scanner.  : A retrospective scan was triggered in the ascending thoracic aorta. Axial non-contrast 3 mm slices were carried out through the heart. The data set was analyzed on a dedicated work station and scored using the Agatson method. Gantry rotation speed was 330 msecs and collimation was .6 mm. 100mg  of metoprolol and 0.8 mg of sl NTG was given. The 3D data set was reconstructed in 5% intervals of the 60-95 % of the R-R cycle. Diastolic phases were analyzed  on a dedicated work station using MPR, MIP and VRT modes. The patient received 75 cc of contrast.  FINDINGS: Aorta:  Normal size.  No calcifications.  No dissection.  Aortic Valve:  Trileaflet.  No calcifications.  Coronary Arteries:  Normal coronary origin.  Right dominance.  RCA is a dominant artery. There is non calcified plaque proximally causing moderate stenosis (50-69%).  Left main gives rise to LAD and LCX arteries. LM has no disease.  LAD has no plaque.  LCX is a non-dominant artery.  There is no plaque.  Other findings:  Normal pulmonary vein drainage into the left atrium.  Normal left atrial appendage without a thrombus.  Normal size of the pulmonary artery.  IMPRESSION: 1. Coronary calcium score of 0.  2. Normal coronary origin with right dominance.  3. Non calcified plaque in the proximal RCA causing moderate stenosis (50-69%).  4. CAD-RADS 3. Moderate stenosis. Consider symptom-guided anti-ischemic pharmacotherapy as well as risk factor modification per guideline directed care.  5. Additional analysis with CT FFR will be submitted and reported separately.   Electronically Signed By: Debbe Odea M.D. On: 10/08/2022 15:41         Recent Labs: 01/08/2022: ALT 21 10/02/2022: BNP 3.8; BUN 20; Creatinine, Ser 0.82; Hemoglobin 15.3; Platelets 333; Potassium 4.0; Sodium 139  Recent Lipid Panel    Component Value Date/Time   CHOL 225 (H) 01/08/2022 1124   TRIG 143 01/08/2022 1124   HDL 57 01/08/2022  1124   CHOLHDL 3.9 01/08/2022 1124   LDLCALC 143 (H) 01/08/2022 1124   History of Present Illness  52 year old female with past medical history of noncalcified CAD with dyspnea on exertion concerning for anginal equivalent, hyperlipidemia and Stills Disease. She presents today for follow up regarding her coronary CTA results.   She was first seen by Dr. Herbie Baltimore on 09/06/22 after referral from ENT. She had been noting continued shortness of breath and  stridor following an URI in January/February with cough for almost 2 months, described as breathing through a filter. Also noted difficulty taking a deep breath, worsening snoring and worsening reflux. Per patient her heart rate at rest was increased and increased further with exertion, she denied chest tightness or pressure but noted a tightening occurring in the lower neck/throat. Overall atypical features but could not exclude angina, particularly with family history.   On 10/08/22 she had a coronary CTA. Her coronary calcium score was 0.  Noncalcified plaque in the right coronary artery with moderate to severe significance (FFRCT 0.72) was noted. Based on these results cardiac catheterization was recommended.   Today she reports ongoing shortness of  breath, intermittent rapid heart rate and intermittent lower neck tightness with exertion, relieves with rest. Denies chest pain, only tightness in throat/neck. She also reports fatigue at all times even with adequate sleep. Notes some weight gain in recent weeks and lower extremity swelling.   Home Medications    Current Outpatient Medications  Medication Sig Dispense Refill   albuterol (VENTOLIN HFA) 108 (90 Base) MCG/ACT inhaler Inhale 2 puffs into the lungs every 6 (six) hours as needed for wheezing or shortness of breath. 8 g 0   amphetamine-dextroamphetamine (ADDERALL) 20 MG tablet Take 1 tablet (20 mg total) by mouth 2 (two) times daily. (Patient not taking: Reported on 10/02/2022) 60 tablet 0   amphetamine-dextroamphetamine (ADDERALL) 20 MG tablet Take 1 tablet (20 mg total) by mouth 2 (two) times daily. (Patient not taking: Reported on 10/02/2022) 60 tablet 0   amphetamine-dextroamphetamine (ADDERALL) 20 MG tablet Take 1 tablet (20 mg total) by mouth 2 (two) times daily. (Patient not taking: Reported on 09/06/2022) 60 tablet 0   amphetamine-dextroamphetamine (ADDERALL) 20 MG tablet Take 1 tablet (20 mg total) by mouth 2 (two) times daily. 60 tablet 0    budesonide-formoterol (SYMBICORT) 160-4.5 MCG/ACT inhaler Inhale 2 puffs into the lungs 2 (two) times daily. (Patient not taking: Reported on 10/02/2022) 1 each 3   buPROPion (WELLBUTRIN XL) 150 MG 24 hr tablet Take 1 tablet (150 mg total) by mouth daily. (Patient not taking: Reported on 10/02/2022) 90 tablet 1   fluconazole (DIFLUCAN) 100 MG tablet Take 1 tablet (100 mg total) by mouth daily. (Patient not taking: Reported on 09/06/2022) 7 tablet 0   fluticasone (FLONASE) 50 MCG/ACT nasal spray Place 2 sprays into both nostrils daily. 16 g 6   ketoconazole (NIZORAL) 2 % cream Apply 1 Application topically daily. (Patient not taking: Reported on 10/02/2022) 30 g 0   metoprolol tartrate (LOPRESSOR) 100 MG tablet Take 1 tablet (100 mg total) by mouth once for 1 dose. TAKE 2 HOURS PRIOR TO YOUR PROCEDURE (CCTA) 1 tablet 0   omeprazole (PRILOSEC) 40 MG capsule Take 40 mg by mouth daily.     No current facility-administered medications for this visit.     Review of Systems    She denies chest pain, pnd, orthopnea,  or syncope.  Endorses throat/jaw tightening. All other systems reviewed and are otherwise  negative except as noted above.    Physical Exam    VS:  BP 132/72   Pulse 78   Ht 5\' 10"  (1.778 m)   Wt 253 lb 9.6 oz (115 kg)   SpO2 99%   BMI 36.39 kg/m  , BMI Body mass index is 36.39 kg/m.     GEN: Well nourished, well developed, in no acute distress. HEENT: normal. Neck: Supple, no JVD, carotid bruits, or masses. Cardiac: RRR, no murmurs, rubs, or gallops. No clubbing, cyanosis, edema.  Radials/DP/PT 2+ and equal bilaterally.  Respiratory:  Respirations regular and unlabored, clear to auscultation bilaterally. No wheezing, rhonchi or rales.  GI: Soft, nontender, nondistended, BS + x 4. MS: no deformity or atrophy. Skin: warm and dry. Neuro:  Strength and sensation are intact. Psych: Normal affect.  Accessory Clinical Findings    ECG personally reviewed by me today - Normal sinus  rhythm with sinus arrhythmia at 78bpm - no acute changes.   Lab Results  Component Value Date   WBC 8.9 10/02/2022   HGB 15.3 10/02/2022   HCT 45.9 10/02/2022   MCV 88 10/02/2022   PLT 333 10/02/2022   Lab Results  Component Value Date   CREATININE 0.82 10/02/2022   BUN 20 10/02/2022   NA 139 10/02/2022   K 4.0 10/02/2022   CL 99 10/02/2022   CO2 23 10/02/2022   Lab Results  Component Value Date   ALT 21 01/08/2022   AST 18 01/08/2022   ALKPHOS 110 01/08/2022   BILITOT 0.4 01/08/2022   Lab Results  Component Value Date   CHOL 225 (H) 01/08/2022   HDL 57 01/08/2022   LDLCALC 143 (H) 01/08/2022   TRIG 143 01/08/2022   CHOLHDL 3.9 01/08/2022    Lab Results  Component Value Date   HGBA1C 6.1 (H) 01/08/2022    Assessment & Plan  CAD involving native coronary arteries with dyspnea on exertion concerning for anginal equivalent: Ms. Richarson was initially seen by Dr. Herbie Baltimore on 09/06/22 reporting worsening shortness of breath and reflux following a URI in January/February. Her coronary CTA indicated some noncalcified plaque in the right coronary artery with moderate to severe significance, calcium score was 0. Based on these results cardiac catheterization was recommended and patient is agreeable. Today she is reporting ongoing dyspnea with exertion and shortness of breath, with lower neck tightness that can relieve with rest. She would like to proceed with left cardiac catheterization. The patient understands that risks include but are not limited to stroke (1 in 1000), death (1 in 1000), kidney failure [usually temporary] (1 in 500), bleeding (1 in 200), allergic reaction [possibly serious] (1 in 200), and agrees to proceed. Reviewed ED precautions. Echo scheduled for June 19th. Left heart catheterization scheduled with Dr. Herbie Baltimore on June 21st. Check CMET, CBC and Lipid panel today. Start Aspirin 81mg  daily and Atorvastatin 40mg  daily.   Hyperlipidemia: Last lipid panel 01/08/22  indicated LDL 143. Check lipid panel and direct LDL today. Start on atorvastatin 40mg  daily. Repeat LFTs and lipid panel in two-three months.   Fatigue: Reports increased fatigue even with adequate sleep. Had been referred by ENT for home sleep study, this is not covered by her insurance. Will refer to pulmonology.     Follow up with Eula Listen, PA following cardiac catheterization.   Rip Harbour, NP 10/12/2022, 9:43 AM

## 2022-10-11 NOTE — H&P (View-Only) (Signed)
 Office Visit    Patient Name: Cassandra Robbins Date of Encounter: 10/11/2022  Primary Care Provider:  Johnson, Megan P, DO Primary Cardiologist:  David Harding, MD  Chief Complaint    52 year old female with past medical history of noncalcified CAD with dyspnea on exertion concerning for anginal equivalent, hyperlipidemia and Stills Disease. She presents today for follow up regarding her coronary CTA results.   Past Medical History    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HCC)    Toe fracture, right 10/08/2017   Past Surgical History:  Procedure Laterality Date   COLONOSCOPY WITH PROPOFOL N/A 02/21/2022   Procedure: COLONOSCOPY WITH PROPOFOL;  Surgeon: Vanga, Rohini Reddy, MD;  Location: [facility] ENDOSCOPY;  Service: Gastroenterology;  Laterality: N/A;   LAPAROSCOPIC ENDOMETRIOSIS FULGURATION  2007   SEPTOPLASTY  2005   TUBAL LIGATION     Allergies Allergies  Allergen Reactions   Penicillins Anaphylaxis   Amoxicillin Rash   Labs/Other Studies Reviewed    The following studies were reviewed today: Cardiac Studies & Procedures          CT SCANS  CT CORONARY MORPH W/CTA COR W/SCORE 10/08/2022  Narrative CLINICAL DATA:  Chest pain  EXAM: Cardiac/Coronary  CTA  TECHNIQUE: The patient was scanned on a Siemens Somatom go.Top scanner.  : A retrospective scan was triggered in the ascending thoracic aorta. Axial non-contrast 3 mm slices were carried out through the heart. The data set was analyzed on a dedicated work station and scored using the Agatson method. Gantry rotation speed was 330 msecs and collimation was .6 mm. 100mg of metoprolol and 0.8 mg of sl NTG was given. The 3D data set was reconstructed in 5% intervals of the 60-95 % of the R-R cycle. Diastolic phases were analyzed  on a dedicated work station using MPR, MIP and VRT modes. The patient received 75 cc of contrast.  FINDINGS: Aorta:  Normal size.  No calcifications.  No dissection.  Aortic Valve:  Trileaflet.  No calcifications.  Coronary Arteries:  Normal coronary origin.  Right dominance.  RCA is a dominant artery. There is non calcified plaque proximally causing moderate stenosis (50-69%).  Left main gives rise to LAD and LCX arteries. LM has no disease.  LAD has no plaque.  LCX is a non-dominant artery.  There is no plaque.  Other findings:  Normal pulmonary vein drainage into the left atrium.  Normal left atrial appendage without a thrombus.  Normal size of the pulmonary artery.  IMPRESSION: 1. Coronary calcium score of 0.  2. Normal coronary origin with right dominance.  3. Non calcified plaque in the proximal RCA causing moderate stenosis (50-69%).  4. CAD-RADS 3. Moderate stenosis. Consider symptom-guided anti-ischemic pharmacotherapy as well as risk factor modification per guideline directed care.  5. Additional analysis with CT FFR will be submitted and reported separately.   Electronically Signed By: Brian  Agbor-Etang M.D. On: 10/08/2022 15:41         Recent Labs: 01/08/2022: ALT 21 10/02/2022: BNP 3.8; BUN 20; Creatinine, Ser 0.82; Hemoglobin 15.3; Platelets 333; Potassium 4.0; Sodium 139  Recent Lipid Panel    Component Value Date/Time   CHOL 225 (H) 01/08/2022 1124   TRIG 143 01/08/2022 1124   HDL 57 01/08/2022   1124   CHOLHDL 3.9 01/08/2022 1124   LDLCALC 143 (H) 01/08/2022 1124   History of Present Illness  52 year old female with past medical history of noncalcified CAD with dyspnea on exertion concerning for anginal equivalent, hyperlipidemia and Stills Disease. She presents today for follow up regarding her coronary CTA results.   She was first seen by Dr. Harding on 09/06/22 after referral from ENT. She had been noting continued shortness of breath and  stridor following an URI in January/February with cough for almost 2 months, described as breathing through a filter. Also noted difficulty taking a deep breath, worsening snoring and worsening reflux. Per patient her heart rate at rest was increased and increased further with exertion, she denied chest tightness or pressure but noted a tightening occurring in the lower neck/throat. Overall atypical features but could not exclude angina, particularly with family history.   On 10/08/22 she had a coronary CTA. Her coronary calcium score was 0.  Noncalcified plaque in the right coronary artery with moderate to severe significance (FFRCT 0.72) was noted. Based on these results cardiac catheterization was recommended.   Today she reports ongoing shortness of  breath, intermittent rapid heart rate and intermittent lower neck tightness with exertion, relieves with rest. Denies chest pain, only tightness in throat/neck. She also reports fatigue at all times even with adequate sleep. Notes some weight gain in recent weeks and lower extremity swelling.   Home Medications    Current Outpatient Medications  Medication Sig Dispense Refill   albuterol (VENTOLIN HFA) 108 (90 Base) MCG/ACT inhaler Inhale 2 puffs into the lungs every 6 (six) hours as needed for wheezing or shortness of breath. 8 g 0   amphetamine-dextroamphetamine (ADDERALL) 20 MG tablet Take 1 tablet (20 mg total) by mouth 2 (two) times daily. (Patient not taking: Reported on 10/02/2022) 60 tablet 0   amphetamine-dextroamphetamine (ADDERALL) 20 MG tablet Take 1 tablet (20 mg total) by mouth 2 (two) times daily. (Patient not taking: Reported on 10/02/2022) 60 tablet 0   amphetamine-dextroamphetamine (ADDERALL) 20 MG tablet Take 1 tablet (20 mg total) by mouth 2 (two) times daily. (Patient not taking: Reported on 09/06/2022) 60 tablet 0   amphetamine-dextroamphetamine (ADDERALL) 20 MG tablet Take 1 tablet (20 mg total) by mouth 2 (two) times daily. 60 tablet 0    budesonide-formoterol (SYMBICORT) 160-4.5 MCG/ACT inhaler Inhale 2 puffs into the lungs 2 (two) times daily. (Patient not taking: Reported on 10/02/2022) 1 each 3   buPROPion (WELLBUTRIN XL) 150 MG 24 hr tablet Take 1 tablet (150 mg total) by mouth daily. (Patient not taking: Reported on 10/02/2022) 90 tablet 1   fluconazole (DIFLUCAN) 100 MG tablet Take 1 tablet (100 mg total) by mouth daily. (Patient not taking: Reported on 09/06/2022) 7 tablet 0   fluticasone (FLONASE) 50 MCG/ACT nasal spray Place 2 sprays into both nostrils daily. 16 g 6   ketoconazole (NIZORAL) 2 % cream Apply 1 Application topically daily. (Patient not taking: Reported on 10/02/2022) 30 g 0   metoprolol tartrate (LOPRESSOR) 100 MG tablet Take 1 tablet (100 mg total) by mouth once for 1 dose. TAKE 2 HOURS PRIOR TO YOUR PROCEDURE (CCTA) 1 tablet 0   omeprazole (PRILOSEC) 40 MG capsule Take 40 mg by mouth daily.     No current facility-administered medications for this visit.     Review of Systems    She denies chest pain, pnd, orthopnea,  or syncope.  Endorses throat/jaw tightening. All other systems reviewed and are otherwise   negative except as noted above.    Physical Exam    VS:  BP 132/72   Pulse 78   Ht 5' 10" (1.778 m)   Wt 253 lb 9.6 oz (115 kg)   SpO2 99%   BMI 36.39 kg/m  , BMI Body mass index is 36.39 kg/m.     GEN: Well nourished, well developed, in no acute distress. HEENT: normal. Neck: Supple, no JVD, carotid bruits, or masses. Cardiac: RRR, no murmurs, rubs, or gallops. No clubbing, cyanosis, edema.  Radials/DP/PT 2+ and equal bilaterally.  Respiratory:  Respirations regular and unlabored, clear to auscultation bilaterally. No wheezing, rhonchi or rales.  GI: Soft, nontender, nondistended, BS + x 4. MS: no deformity or atrophy. Skin: warm and dry. Neuro:  Strength and sensation are intact. Psych: Normal affect.  Accessory Clinical Findings    ECG personally reviewed by me today - Normal sinus  rhythm with sinus arrhythmia at 78bpm - no acute changes.   Lab Results  Component Value Date   WBC 8.9 10/02/2022   HGB 15.3 10/02/2022   HCT 45.9 10/02/2022   MCV 88 10/02/2022   PLT 333 10/02/2022   Lab Results  Component Value Date   CREATININE 0.82 10/02/2022   BUN 20 10/02/2022   NA 139 10/02/2022   K 4.0 10/02/2022   CL 99 10/02/2022   CO2 23 10/02/2022   Lab Results  Component Value Date   ALT 21 01/08/2022   AST 18 01/08/2022   ALKPHOS 110 01/08/2022   BILITOT 0.4 01/08/2022   Lab Results  Component Value Date   CHOL 225 (H) 01/08/2022   HDL 57 01/08/2022   LDLCALC 143 (H) 01/08/2022   TRIG 143 01/08/2022   CHOLHDL 3.9 01/08/2022    Lab Results  Component Value Date   HGBA1C 6.1 (H) 01/08/2022    Assessment & Plan  CAD involving native coronary arteries with dyspnea on exertion concerning for anginal equivalent: Ms. Wurzel was initially seen by Dr. Harding on 09/06/22 reporting worsening shortness of breath and reflux following a URI in January/February. Her coronary CTA indicated some noncalcified plaque in the right coronary artery with moderate to severe significance, calcium score was 0. Based on these results cardiac catheterization was recommended and patient is agreeable. Today she is reporting ongoing dyspnea with exertion and shortness of breath, with lower neck tightness that can relieve with rest. She would like to proceed with left cardiac catheterization. The patient understands that risks include but are not limited to stroke (1 in 1000), death (1 in 1000), kidney failure [usually temporary] (1 in 500), bleeding (1 in 200), allergic reaction [possibly serious] (1 in 200), and agrees to proceed. Reviewed ED precautions. Echo scheduled for June 19th. Left heart catheterization scheduled with Dr. Harding on June 21st. Check CMET, CBC and Lipid panel today. Start Aspirin 81mg daily and Atorvastatin 40mg daily.   Hyperlipidemia: Last lipid panel 01/08/22  indicated LDL 143. Check lipid panel and direct LDL today. Start on atorvastatin 40mg daily. Repeat LFTs and lipid panel in two-three months.   Fatigue: Reports increased fatigue even with adequate sleep. Had been referred by ENT for home sleep study, this is not covered by her insurance. Will refer to pulmonology.     Follow up with Ryan Dunn, PA following cardiac catheterization.   Jeraldin Fesler D Donnisha Besecker, NP 10/12/2022, 9:43 AM    

## 2022-10-12 ENCOUNTER — Encounter: Payer: Self-pay | Admitting: Physician Assistant

## 2022-10-12 ENCOUNTER — Other Ambulatory Visit
Admission: RE | Admit: 2022-10-12 | Discharge: 2022-10-12 | Disposition: A | Discharge status: Home / Self Care | Payer: 59 | Attending: Physician Assistant | Admitting: Physician Assistant

## 2022-10-12 ENCOUNTER — Ambulatory Visit: Payer: 59 | Attending: Physician Assistant | Admitting: Cardiology

## 2022-10-12 VITALS — BP 132/72 | HR 78 | Ht 70.0 in | Wt 253.6 lb

## 2022-10-12 DIAGNOSIS — I2089 Other forms of angina pectoris: Secondary | ICD-10-CM

## 2022-10-12 DIAGNOSIS — G4730 Sleep apnea, unspecified: Secondary | ICD-10-CM | POA: Diagnosis not present

## 2022-10-12 DIAGNOSIS — E782 Mixed hyperlipidemia: Secondary | ICD-10-CM | POA: Diagnosis not present

## 2022-10-12 DIAGNOSIS — R0609 Other forms of dyspnea: Secondary | ICD-10-CM

## 2022-10-12 DIAGNOSIS — I25110 Atherosclerotic heart disease of native coronary artery with unstable angina pectoris: Secondary | ICD-10-CM

## 2022-10-12 LAB — COMPREHENSIVE METABOLIC PANEL
ALT: 27 U/L (ref 0–44)
AST: 27 U/L (ref 15–41)
Albumin: 3.8 g/dL (ref 3.5–5.0)
Alkaline Phosphatase: 96 U/L (ref 38–126)
Anion gap: 9 (ref 5–15)
BUN: 16 mg/dL (ref 6–20)
CO2: 26 mmol/L (ref 22–32)
Calcium: 9.3 mg/dL (ref 8.9–10.3)
Chloride: 105 mmol/L (ref 98–111)
Creatinine, Ser: 0.73 mg/dL (ref 0.44–1.00)
GFR, Estimated: >60 mL/min (ref >60)
Glucose, Bld: 107 mg/dL — ABNORMAL HIGH (ref 70–99)
Potassium: 4.3 mmol/L (ref 3.5–5.1)
Sodium: 140 mmol/L (ref 135–145)
Total Bilirubin: 0.4 mg/dL (ref 0.3–1.2)
Total Protein: 7.7 g/dL (ref 6.5–8.1)

## 2022-10-12 LAB — LIPID PANEL
Cholesterol: 239 mg/dL — ABNORMAL HIGH (ref 0–200)
HDL: 59 mg/dL (ref >40)
LDL Cholesterol: 160 mg/dL — ABNORMAL HIGH (ref 0–99)
Total CHOL/HDL Ratio: 4.1 RATIO
Triglycerides: 99 mg/dL (ref <150)
VLDL: 20 mg/dL (ref 0–40)

## 2022-10-12 LAB — CBC
HCT: 45.1 % (ref 36.0–46.0)
Hemoglobin: 14.8 g/dL (ref 12.0–15.0)
MCH: 28.6 pg (ref 26.0–34.0)
MCHC: 32.8 g/dL (ref 30.0–36.0)
MCV: 87.1 fL (ref 80.0–100.0)
Platelets: 313 10*3/uL (ref 150–400)
RBC: 5.18 MIL/uL — ABNORMAL HIGH (ref 3.87–5.11)
RDW: 12.5 % (ref 11.5–15.5)
WBC: 5.9 10*3/uL (ref 4.0–10.5)
nRBC: 0.3 % — ABNORMAL HIGH (ref 0.0–0.2)

## 2022-10-12 LAB — LDL CHOLESTEROL, DIRECT: Direct LDL: 160 mg/dL — ABNORMAL HIGH (ref 0–99)

## 2022-10-12 MED ORDER — ASPIRIN 81 MG PO TBEC
81.0000 mg | DELAYED_RELEASE_TABLET | Freq: Every day | ORAL | 3 refills | Status: AC
Start: 2022-10-12 — End: ?

## 2022-10-12 MED ORDER — ATORVASTATIN CALCIUM 40 MG PO TABS
40.0000 mg | ORAL_TABLET | Freq: Every day | ORAL | 3 refills | Status: DC
Start: 2022-10-12 — End: 2022-11-20

## 2022-10-12 NOTE — Patient Instructions (Signed)
Medication Instructions:  Your physician has recommended you make the following change in your medication:   START Atorvastatin 40 mg once daily  START Aspirin 81 mg once daily   *If you need a refill on your cardiac medications before your next appointment, please call your pharmacy*   Lab Work: CBC, CMET, Lipid, Direct LDL today over at the [facility] entrance and stop at registration desk.   They would also like Lipid & Liver panel to be done in 2 months. No appointment is needed. These are fasting labs so nothing to eat or drink after midnight before except sip of water with your medications. Go to [facility] entrance and check in at registration desk.   If you have labs (blood work) drawn today and your tests are completely normal, you will receive your results only by: MyChart Message (if you have MyChart) OR A paper copy in the mail If you have any lab test that is abnormal or we need to change your treatment, we will call you to review the results.   Testing/Procedures:    [place] [facility] DEPT OF [facility] [place] [address] [place] [address] Dept: [phone] Loc: [phone]  Cassandra Robbins  10/12/2022  You are scheduled for a Cardiac Catheterization on Friday, June 21 with Dr. [name].  1. Please arrive at the [facility] (Main Entrance A) at [facility]: [address] [place], [place] [address] at 5:30 AM (This time is 2 hour(s) before your procedure to ensure your preparation). Free valet parking service is available. You will check in at ADMITTING. The support person will be asked to wait in the waiting room.  It is OK to have someone drop you off and come back when you are ready to be discharged.    Special note: Every effort is made to have your procedure done on time. Please understand that emergencies sometimes delay scheduled procedures.  2.  Diet: Do not eat solid foods after midnight.  The patient may have clear liquids until 5am upon the day of the procedure.  3. Labs: To be done today  4. Medication instructions in preparation for your procedure: You may take all of your medications with small sip of water.    On the morning of your procedure, take your Aspirin 81 mg and any morning medicines NOT listed above.  You may use sips of water.  5. Plan to go home the same day, you will only stay overnight if medically necessary. 6. Bring a current list of your medications and current insurance cards. 7. You MUST have a responsible person to drive you home. 8. Someone MUST be with you the first 24 hours after you arrive home or your discharge will be delayed. 9. Please wear clothes that are easy to get on and off and wear slip-on shoes.  Thank you for allowing [place] to care for you!   -- [facility]    Follow-Up: At [facility], you and your health needs are our priority.  As part of our continuing mission to provide you with exceptional heart care, we have created designated Provider Care Teams.  These Care Teams include your primary Cardiologist (physician) and Advanced Practice Providers (APPs -  Physician Assistants and Nurse Practitioners) who all work together to provide you with the care you need, when you need it.   Your next appointment:  2 week(s) after procedure.  Provider:   [name], MD

## 2022-10-15 ENCOUNTER — Other Ambulatory Visit: Payer: Self-pay | Admitting: *Deleted

## 2022-10-15 ENCOUNTER — Telehealth: Payer: Self-pay | Admitting: *Deleted

## 2022-10-15 DIAGNOSIS — I25110 Atherosclerotic heart disease of native coronary artery with unstable angina pectoris: Secondary | ICD-10-CM

## 2022-10-15 DIAGNOSIS — E782 Mixed hyperlipidemia: Secondary | ICD-10-CM

## 2022-10-15 NOTE — Telephone Encounter (Signed)
-----   Message from Rip Harbour, NP sent at 10/15/2022  8:27 AM EDT ----- Can we add on an LFTs at time of recheck? Also no need to recheck direct LDL, will only need fasting lipids and LFTs. Thanks!

## 2022-10-15 NOTE — Telephone Encounter (Signed)
-----   Message from Rip Harbour, NP sent at 10/12/2022  4:43 PM EDT ----- Your LDL (bad cholesterol) is elevated, we would like to see this lower. The Lipitor (atorvastatin) ordered today should help to lower your LDL. This will be rechecked in 2-3 months. Your other lab results look good.

## 2022-10-15 NOTE — Telephone Encounter (Signed)
Results and recommendations reviewed with patient. She verbalized understanding with no further questions at this time.   My Chart message sent with further instructions on labs.

## 2022-10-17 ENCOUNTER — Telehealth: Payer: Self-pay | Admitting: *Deleted

## 2022-10-17 ENCOUNTER — Ambulatory Visit: Payer: 59 | Attending: Cardiology

## 2022-10-17 DIAGNOSIS — R0609 Other forms of dyspnea: Secondary | ICD-10-CM | POA: Diagnosis not present

## 2022-10-17 LAB — ECHOCARDIOGRAM COMPLETE
AR max vel: 2.52 cm2
AV Area VTI: 2.84 cm2
AV Area mean vel: 2.61 cm2
AV Mean grad: 4 mmHg
AV Peak grad: 6.9 mmHg
Ao pk vel: 1.32 m/s
Area-P 1/2: 3.27 cm2
Calc EF: 56.9 %
S' Lateral: 3.7 cm
Single Plane A2C EF: 55.7 %
Single Plane A4C EF: 54.3 %

## 2022-10-17 NOTE — Telephone Encounter (Signed)
Cardiac Catheterization scheduled at [facility] for: Friday October 19, 2022 7:30 AM Arrival time [facility] Main Entrance A at: 5:30 AM  Nothing to eat after midnight prior to procedure, clear liquids until 5 AM day of procedure.  Medication instructions: -Usual morning medications can be taken with sips of water including aspirin 81 mg.  Confirmed patient has responsible adult to drive home post procedure and be with patient first 24 hours after arriving home.  Plan to go home the same day, you will only stay overnight if medically necessary.  Reviewed procedure instructions with patient.

## 2022-10-19 ENCOUNTER — Ambulatory Visit (HOSPITAL_COMMUNITY): Payer: 59

## 2022-10-19 ENCOUNTER — Ambulatory Visit (HOSPITAL_COMMUNITY)
Admission: RE | Admit: 2022-10-19 | Discharge: 2022-10-20 | Disposition: A | Discharge status: Home / Self Care | Payer: 59 | Attending: Cardiology | Admitting: Cardiology

## 2022-10-19 ENCOUNTER — Encounter (HOSPITAL_COMMUNITY)
Admission: RE | Disposition: A | Discharge status: Home / Self Care | Payer: Self-pay | Source: Home / Self Care | Attending: Cardiology

## 2022-10-19 DIAGNOSIS — R931 Abnormal findings on diagnostic imaging of heart and coronary circulation: Secondary | ICD-10-CM | POA: Insufficient documentation

## 2022-10-19 DIAGNOSIS — M0820 Juvenile rheumatoid arthritis with systemic onset, unspecified site: Secondary | ICD-10-CM | POA: Insufficient documentation

## 2022-10-19 DIAGNOSIS — I2510 Atherosclerotic heart disease of native coronary artery without angina pectoris: Secondary | ICD-10-CM

## 2022-10-19 DIAGNOSIS — E785 Hyperlipidemia, unspecified: Secondary | ICD-10-CM | POA: Diagnosis present

## 2022-10-19 DIAGNOSIS — R29818 Other symptoms and signs involving the nervous system: Secondary | ICD-10-CM | POA: Diagnosis not present

## 2022-10-19 DIAGNOSIS — R519 Headache, unspecified: Secondary | ICD-10-CM | POA: Insufficient documentation

## 2022-10-19 DIAGNOSIS — Z955 Presence of coronary angioplasty implant and graft: Secondary | ICD-10-CM

## 2022-10-19 HISTORY — PX: LEFT HEART CATH AND CORONARY ANGIOGRAPHY: CATH118249

## 2022-10-19 HISTORY — PX: CORONARY STENT INTERVENTION: CATH118234

## 2022-10-19 LAB — POCT ACTIVATED CLOTTING TIME
Activated Clotting Time: 262 seconds
Activated Clotting Time: 269 seconds
Activated Clotting Time: 281 seconds
Activated Clotting Time: 311 seconds
Activated Clotting Time: 238 seconds

## 2022-10-19 SURGERY — LEFT HEART CATH AND CORONARY ANGIOGRAPHY
Anesthesia: LOCAL

## 2022-10-19 MED ORDER — FENTANYL CITRATE (PF) 100 MCG/2ML IJ SOLN
INTRAMUSCULAR | Status: DC | PRN
  Administered 2022-10-19 (×4): 25 ug via INTRAVENOUS

## 2022-10-19 MED ORDER — NITROGLYCERIN 1 MG/10 ML FOR IR/CATH LAB
INTRA_ARTERIAL | Status: AC
  Filled 2022-10-19: qty 10

## 2022-10-19 MED ORDER — VERAPAMIL HCL 2.5 MG/ML IV SOLN
INTRAVENOUS | Status: DC | PRN
  Administered 2022-10-19 (×2): 10 mL via INTRA_ARTERIAL

## 2022-10-19 MED ORDER — MIDAZOLAM HCL 2 MG/2ML IJ SOLN
INTRAMUSCULAR | Status: AC
  Filled 2022-10-19: qty 2

## 2022-10-19 MED ORDER — SODIUM CHLORIDE 0.9 % WEIGHT BASED INFUSION
3.0000 mL/kg/h | INTRAVENOUS | Status: DC
  Administered 2022-10-19: 3 mL/kg/h via INTRAVENOUS

## 2022-10-19 MED ORDER — NITROGLYCERIN IN D5W 200-5 MCG/ML-% IV SOLN: 5.0000 ug/min | INTRAVENOUS | Status: DC

## 2022-10-19 MED ORDER — HEPARIN SODIUM (PORCINE) 1000 UNIT/ML IJ SOLN
INTRAMUSCULAR | Status: DC | PRN
  Administered 2022-10-19: 5500 [IU] via INTRAVENOUS
  Administered 2022-10-19: 6000 [IU] via INTRAVENOUS
  Administered 2022-10-19 (×2): 2000 [IU] via INTRAVENOUS

## 2022-10-19 MED ORDER — VERAPAMIL HCL ER 180 MG PO TBCR
180.0000 mg | EXTENDED_RELEASE_TABLET | Freq: Once | ORAL | Status: AC
  Administered 2022-10-19: 180 mg via ORAL
  Filled 2022-10-19: qty 1

## 2022-10-19 MED ORDER — CLOPIDOGREL BISULFATE 75 MG PO TABS
75.0000 mg | ORAL_TABLET | Freq: Every day | ORAL | Status: DC
  Administered 2022-10-20: 75 mg via ORAL
  Filled 2022-10-19: qty 1

## 2022-10-19 MED ORDER — ACETAMINOPHEN 500 MG PO TABS
1000.0000 mg | ORAL_TABLET | Freq: Four times a day (QID) | ORAL | Status: DC | PRN
  Administered 2022-10-19 – 2022-10-20 (×3): 1000 mg via ORAL
  Filled 2022-10-19 (×3): qty 2

## 2022-10-19 MED ORDER — SODIUM CHLORIDE 0.9 % WEIGHT BASED INFUSION: 1.0000 mL/kg/h | INTRAVENOUS | Status: DC

## 2022-10-19 MED ORDER — VERAPAMIL HCL 2.5 MG/ML IV SOLN
INTRAVENOUS | Status: AC
  Filled 2022-10-19: qty 2

## 2022-10-19 MED ORDER — HEPARIN SODIUM (PORCINE) 1000 UNIT/ML IJ SOLN
INTRAMUSCULAR | Status: AC
  Filled 2022-10-19: qty 10

## 2022-10-19 MED ORDER — MAGNESIUM SULFATE 2 GM/50ML IV SOLN
2.0000 g | Freq: Once | INTRAVENOUS | Status: AC
  Administered 2022-10-19: 2 g via INTRAVENOUS
  Filled 2022-10-19: qty 50

## 2022-10-19 MED ORDER — ONDANSETRON HCL 4 MG/2ML IJ SOLN: 4.0000 mg | Freq: Four times a day (QID) | INTRAMUSCULAR | Status: DC | PRN

## 2022-10-19 MED ORDER — LIDOCAINE HCL (PF) 1 % IJ SOLN
INTRAMUSCULAR | Status: DC | PRN
  Administered 2022-10-19: 5 mL

## 2022-10-19 MED ORDER — ASPIRIN 81 MG PO CHEW
81.0000 mg | CHEWABLE_TABLET | ORAL | Status: DC
  Administered 2022-10-19: 81 mg via ORAL
  Filled 2022-10-19: qty 1

## 2022-10-19 MED ORDER — FAMOTIDINE IN NACL 20-0.9 MG/50ML-% IV SOLN
INTRAVENOUS | Status: DC | PRN
  Administered 2022-10-19: 20 mg via INTRAVENOUS

## 2022-10-19 MED ORDER — PANTOPRAZOLE SODIUM 40 MG PO TBEC
40.0000 mg | DELAYED_RELEASE_TABLET | Freq: Every day | ORAL | Status: DC
  Administered 2022-10-20: 40 mg via ORAL
  Filled 2022-10-19: qty 1

## 2022-10-19 MED ORDER — FENTANYL CITRATE (PF) 100 MCG/2ML IJ SOLN
INTRAMUSCULAR | Status: AC
  Filled 2022-10-19: qty 2

## 2022-10-19 MED ORDER — MIDAZOLAM HCL 2 MG/2ML IJ SOLN
INTRAMUSCULAR | Status: DC | PRN
  Administered 2022-10-19 (×2): 1 mg via INTRAVENOUS
  Administered 2022-10-19: 2 mg via INTRAVENOUS

## 2022-10-19 MED ORDER — SODIUM CHLORIDE 0.9 % IV SOLN: 250.0000 mL | INTRAVENOUS | Status: DC | PRN

## 2022-10-19 MED ORDER — SODIUM CHLORIDE 0.9% FLUSH
3.0000 mL | Freq: Two times a day (BID) | INTRAVENOUS | Status: DC
  Administered 2022-10-19: 3 mL via INTRAVENOUS

## 2022-10-19 MED ORDER — ACETAMINOPHEN 500 MG PO TABS: 1000.0000 mg | ORAL_TABLET | Freq: Three times a day (TID) | ORAL | Status: DC | PRN

## 2022-10-19 MED ORDER — NITROGLYCERIN 1 MG/10 ML FOR IR/CATH LAB
INTRA_ARTERIAL | Status: DC | PRN
  Administered 2022-10-19 (×5): 200 ug via INTRACORONARY

## 2022-10-19 MED ORDER — SODIUM CHLORIDE 0.9% FLUSH
3.0000 mL | Freq: Two times a day (BID) | INTRAVENOUS | Status: DC
  Administered 2022-10-19 (×2): 3 mL via INTRAVENOUS

## 2022-10-19 MED ORDER — ATORVASTATIN CALCIUM 40 MG PO TABS
40.0000 mg | ORAL_TABLET | Freq: Every day | ORAL | Status: DC
  Administered 2022-10-19: 40 mg via ORAL
  Filled 2022-10-19 (×2): qty 1

## 2022-10-19 MED ORDER — AMPHETAMINE-DEXTROAMPHETAMINE 20 MG PO TABS
20.0000 mg | ORAL_TABLET | Freq: Two times a day (BID) | ORAL | Status: DC
  Administered 2022-10-20: 20 mg via ORAL
  Filled 2022-10-19: qty 1

## 2022-10-19 MED ORDER — SODIUM CHLORIDE 0.9 % WEIGHT BASED INFUSION
1.0000 mL/kg/h | INTRAVENOUS | Status: AC
  Administered 2022-10-19 (×2): 1 mL/kg/h via INTRAVENOUS

## 2022-10-19 MED ORDER — NITROGLYCERIN IN D5W 200-5 MCG/ML-% IV SOLN
INTRAVENOUS | Status: AC | PRN
  Administered 2022-10-19: 5 ug/min via INTRAVENOUS

## 2022-10-19 MED ORDER — NITROGLYCERIN IN D5W 200-5 MCG/ML-% IV SOLN
5.0000 ug/min | INTRAVENOUS | Status: DC
  Administered 2022-10-19: 5 ug/min via INTRAVENOUS

## 2022-10-19 MED ORDER — HEPARIN (PORCINE) IN NACL 1000-0.9 UT/500ML-% IV SOLN
INTRAVENOUS | Status: DC | PRN
  Administered 2022-10-19 (×2): 500 mL via INTRA_ARTERIAL

## 2022-10-19 MED ORDER — NITROGLYCERIN 0.4 MG SL SUBL: 0.4000 mg | SUBLINGUAL_TABLET | SUBLINGUAL | Status: DC | PRN

## 2022-10-19 MED ORDER — NITROGLYCERIN IN D5W 200-5 MCG/ML-% IV SOLN
INTRAVENOUS | Status: AC
  Filled 2022-10-19: qty 250

## 2022-10-19 MED ORDER — DIPHENHYDRAMINE HCL 50 MG/ML IJ SOLN
12.5000 mg | Freq: Once | INTRAMUSCULAR | Status: AC
  Administered 2022-10-19: 12.5 mg via INTRAVENOUS
  Filled 2022-10-19: qty 1

## 2022-10-19 MED ORDER — FAMOTIDINE IN NACL 20-0.9 MG/50ML-% IV SOLN
INTRAVENOUS | Status: AC
  Filled 2022-10-19: qty 50

## 2022-10-19 MED ORDER — SODIUM CHLORIDE 0.9% FLUSH: 3.0000 mL | INTRAVENOUS | Status: DC | PRN

## 2022-10-19 MED ORDER — CLOPIDOGREL BISULFATE 300 MG PO TABS
ORAL_TABLET | ORAL | Status: DC | PRN
  Administered 2022-10-19: 600 mg via ORAL

## 2022-10-19 MED ORDER — FLUTICASONE PROPIONATE 50 MCG/ACT NA SUSP
2.0000 | Freq: Every day | NASAL | Status: DC
  Administered 2022-10-19 – 2022-10-20 (×2): 2 via NASAL
  Filled 2022-10-19: qty 16

## 2022-10-19 MED ORDER — LABETALOL HCL 5 MG/ML IV SOLN: 10.0000 mg | INTRAVENOUS | Status: AC | PRN

## 2022-10-19 MED ORDER — MOMETASONE FURO-FORMOTEROL FUM 200-5 MCG/ACT IN AERO
2.0000 | INHALATION_SPRAY | Freq: Two times a day (BID) | RESPIRATORY_TRACT | Status: DC
  Administered 2022-10-20: 2 via RESPIRATORY_TRACT
  Filled 2022-10-19: qty 8.8

## 2022-10-19 MED ORDER — HYDRALAZINE HCL 20 MG/ML IJ SOLN: 10.0000 mg | INTRAMUSCULAR | Status: AC | PRN

## 2022-10-19 MED ORDER — MORPHINE SULFATE (PF) 2 MG/ML IV SOLN
2.0000 mg | INTRAVENOUS | Status: AC | PRN
  Administered 2022-10-19 (×2): 2 mg via INTRAVENOUS
  Filled 2022-10-19 (×2): qty 1

## 2022-10-19 MED ORDER — ASPIRIN 81 MG PO TBEC
81.0000 mg | DELAYED_RELEASE_TABLET | Freq: Every day | ORAL | Status: DC
  Administered 2022-10-20: 81 mg via ORAL
  Filled 2022-10-19: qty 1

## 2022-10-19 MED ORDER — MORPHINE SULFATE (PF) 2 MG/ML IV SOLN
INTRAVENOUS | Status: AC
  Administered 2022-10-19: 2 mg via INTRAVENOUS
  Filled 2022-10-19: qty 1

## 2022-10-19 MED ORDER — IOHEXOL 350 MG/ML SOLN
INTRAVENOUS | Status: DC | PRN
  Administered 2022-10-19: 235 mL via INTRA_ARTERIAL

## 2022-10-19 MED ORDER — CLOPIDOGREL BISULFATE 300 MG PO TABS
ORAL_TABLET | ORAL | Status: AC
  Filled 2022-10-19: qty 1

## 2022-10-19 SURGICAL SUPPLY — 28 items
BALLN EMERGE MR 2.5X12 (BALLOONS) ×1
BALLN EMERGE MR 3.0X12 (BALLOONS) ×1
BALLN SCOREFLEX 3.0X15 (BALLOONS) ×1
BALLN ~~LOC~~ EMERGE MR 3.5X15 (BALLOONS) ×1
BALLN ~~LOC~~ EMERGE MR 3.75X12 (BALLOONS) ×1
BALLOON EMERGE MR 2.5X12 (BALLOONS) IMPLANT
BALLOON EMERGE MR 3.0X12 (BALLOONS) IMPLANT
BALLOON SCOREFLEX 3.0X15 (BALLOONS) IMPLANT
BALLOON ~~LOC~~ EMERGE MR 3.5X15 (BALLOONS) IMPLANT
BALLOON ~~LOC~~ EMERGE MR 3.75X12 (BALLOONS) IMPLANT
CATH OPTITORQUE TIG 4.0 5F (CATHETERS) IMPLANT
CATH VISTA GUIDE 6FR JR4 (CATHETERS) IMPLANT
CATH VISTA GUIDE 6FR XB3.5 (CATHETERS) IMPLANT
ELECT DEFIB PAD ADLT CADENCE (PAD) IMPLANT
GLIDESHEATH SLEND SS 6F .021 (SHEATH) IMPLANT
GUIDEWIRE INQWIRE 1.5J.035X260 (WIRE) IMPLANT
INQWIRE 1.5J .035X260CM (WIRE) ×1
KIT ENCORE 26 ADVANTAGE (KITS) IMPLANT
KIT HEART LEFT (KITS) ×1 IMPLANT
PACK CARDIAC CATHETERIZATION (CUSTOM PROCEDURE TRAY) ×1 IMPLANT
SHEATH PROBE COVER 6X72 (BAG) IMPLANT
STENT SYNERGY XD 3.0X20 (Permanent Stent) IMPLANT
STENT SYNERGY XD 3.50X16 (Permanent Stent) IMPLANT
SYNERGY XD 3.0X20 (Permanent Stent) ×1 IMPLANT
SYNERGY XD 3.50X16 (Permanent Stent) ×1 IMPLANT
TRANSDUCER W/STOPCOCK (MISCELLANEOUS) ×1 IMPLANT
TUBING CIL FLEX 10 FLL-RA (TUBING) ×1 IMPLANT
WIRE ASAHI PROWATER 180CM (WIRE) IMPLANT

## 2022-10-19 NOTE — Code Documentation (Signed)
Stroke Response Nurse Documentation Code Documentation  RISHIKA MCCOLLOM is a 52 y.o. female admitted to [facility]  on 10/19/2022 for unstable angina with past medical hx of CAD, depression, hyperlipidemia and Stills Disease. On No antithrombotic. Code stroke was activated by cath lab.   Patient in cath lab post left cardiac cath and coronary angiography where she was LKW at 1000 and now complaining of visual change in left field of vision. During procedure, vasospasm was noted and nitro drip started. She complains of a severe headache.  Stroke team at the bedside after patient activation. Patient to CT with team. NIHSS 0, see documentation for details and code stroke times. The vision change reported was still present. She sees a crescent shaped colorful zigzag which was first seen in the center but has drifted to the left of her peripheral vision.  The following imaging was completed:  CT Head. Unable to obtain PIV needed for CTA. Patient is not a candidate for IV Thrombolytic due to stroke not suspected.  Care/Plan: Q1 VS and NIHSS x4.   Bedside handoff with RN.    Ferman Hamming Stroke Response RN

## 2022-10-19 NOTE — Plan of Care (Signed)

## 2022-10-19 NOTE — Progress Notes (Signed)
Called report to receiving nurse.  Patient will have q1hr stroke assessment x 4 hours.  Spoke with Dr. Herbie Baltimore and he provided an order for the nitroglycerin infusion to continue at 50mcg/min for a period of 6 hours to prevent coronary spasm.  A prn order for morphine was obtained related to the headache secondary to the nitroglycerin infusion.  The patient is being administered a dose prior to transfer to the floor and there will be 2 doses remaining.   Patient has had no visual disturbances since leaving the CT scan.  There has been a 0-10 for chest pain.   Patient will be transferring to 6E for an overnight stay.

## 2022-10-19 NOTE — Consult Note (Signed)
Neurology Consultation  Reason for Consult: Visual disturbance and left-sided visual field Referring Physician: Dr. Herbie Baltimore  CC: Left-sided visual field disturbance  History is obtained from: Patient and chart  HPI: Cassandra Robbins is a 52 y.o. female with history of ADHD, hearing loss, depression, stills disease and migraines who presents after cardiac cath with 2 stents placed with visual disturbance in the left side of visual field which developed after the procedure.  During cardiac cath, patient was noted to have significant vasospasm and was placed on nitroglycerin infusion.  She received 4 mg of Versed and 100 mcg of fentanyl for procedure.  After procedure, when patient was waking up, she began to complain of a visual disturbance in the left side of her visual field in both eyes.  The disturbance was a crescent shaped multicolored zigzag which was first seen towards the center of her field of vision and then drifted out to the periphery.  It was associated with a severe headache.  Patient does have a history of migraines but has never had an aura before.  LKW: 1000 TNK given?: no, due to heparin IR Thrombectomy? No, exam not consistent with LVO Modified Rankin Scale: 0-Completely asymptomatic and back to baseline post- stroke  ROS: A complete ROS was performed and is negative except as noted in the HPI.   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HCC)    Toe fracture, right 10/08/2017     Family History  Problem Relation Age of Onset   Asthma Mother    Lung disease Mother    Thyroid disease Mother    Cancer Father 87   Breast cancer Neg Hx      Social History:   reports that she has never smoked. She has never used smokeless tobacco. She reports current alcohol use of about 2.0 - 4.0  standard drinks of alcohol per week. She reports that she does not use drugs.  Medications  Current Facility-Administered Medications:    0.9 %  sodium chloride infusion, 250 mL, Intravenous, PRN, West, Katlyn D, NP   [EXPIRED] 0.9% sodium chloride infusion, 3 mL/kg/hr, Intravenous, Continuous, Last Rate: 345 mL/hr at 10/19/22 0556, 3 mL/kg/hr at 10/19/22 0556 **FOLLOWED BY** 0.9% sodium chloride infusion, 1 mL/kg/hr, Intravenous, Continuous, West, Katlyn D, NP, Last Rate: 115 mL/hr at 10/19/22 0656, 1 mL/kg/hr at 10/19/22 0656   [START ON 10/20/2022] clopidogrel (PLAVIX) tablet 75 mg, 75 mg, Oral, Q breakfast, Marykay Lex, MD   clopidogrel (PLAVIX) tablet, , , PRN, Marykay Lex, MD, 600 mg at 10/19/22 0820   famotidine (PEPCID) IVPB 20 mg premix, , , Continuous PRN, Marykay Lex, MD, Stopped at 10/19/22 0902   fentaNYL (SUBLIMAZE) injection, , , PRN, Marykay Lex, MD, 25 mcg at 10/19/22 0948   Heparin (Porcine) in NaCl 1000-0.9 UT/500ML-% SOLN, , , PRN, Marykay Lex, MD, 500 mL at 10/19/22 0806   heparin sodium (porcine) injection, , , PRN, Marykay Lex, MD, 2,000 Units at 10/19/22 1001   iohexol (OMNIPAQUE) 350 MG/ML injection, , , PRN, Marykay Lex, MD, 235 mL at 10/19/22 1002   lidocaine (PF) (XYLOCAINE) 1 % injection, , , PRN, Marykay Lex, MD, 5 mL at 10/19/22 0800   midazolam (VERSED) injection, , ,  PRN, Marykay Lex, MD, 1 mg at 10/19/22 6962   nitroGLYCERIN 1 mg/10 mL (100 mcg/mL) - IR/CATH LAB, , , PRN, Marykay Lex, MD, 200 mcg at 10/19/22 0942   nitroGLYCERIN 100 mcg/mL intra-arterial injection, , , ,    nitroGLYCERIN 50 mg in dextrose 5 % 250 mL (0.2 mg/mL) infusion, , , Continuous PRN, Marykay Lex, MD, Last Rate: 1.5 mL/hr at 10/19/22 0958, 5 mcg/min at 10/19/22 0958   Radial Cocktail/Verapamil only, , , PRN, Marykay Lex, MD, 10 mL at 10/19/22 9528   sodium chloride flush (NS) 0.9 % injection 3 mL, 3 mL, Intravenous, Q12H, West,  Katlyn D, NP   sodium chloride flush (NS) 0.9 % injection 3 mL, 3 mL, Intravenous, PRN, West, Katlyn D, NP   Exam: Current vital signs: BP (!) 155/83   Pulse 72   Temp 98 F (36.7 C) (Temporal)   Resp (!) 21   Ht 5\' 10"  (1.778 m)   Wt 114.8 kg   SpO2 98%   BMI 36.30 kg/m  Vital signs in last 24 hours: Temp:  [98 F (36.7 C)] 98 F (36.7 C) (06/21 0554) Pulse Rate:  [0-92] 72 (06/21 1017) Resp:  [15-34] 21 (06/21 0959) BP: (123-155)/(76-107) 155/83 (06/21 1017) SpO2:  [84 %-100 %] 98 % (06/21 0954) Weight:  [114.8 kg] 114.8 kg (06/21 0554)  GENERAL: Awake, alert, in no acute distress Psych: Affect appropriate for situation, patient is calm and cooperative with examination Head: Normocephalic and atraumatic, without obvious abnormality EENT: Normal conjunctivae, dry mucous membranes, no OP obstruction LUNGS: Normal respiratory effort. Non-labored breathing on room air CV: Regular rate and rhythm on telemetry ABDOMEN: Soft, non-tender, non-distended Extremities: warm, well perfused, without obvious deformity, TR band in place on right wrist  NEURO:  Mental Status: Awake, alert, and oriented to person, place, time, and situation. She is able to provide a clear and coherent history of present illness. Speech/Language: speech is clear and fluent.   Naming, repetition, fluency, and comprehension intact without aphasia  No neglect is noted Cranial Nerves:  II: PERRL visual fields full.  III, IV, VI: EOMI. Lid elevation symmetric and full.  V: Sensation is intact to light touch and symmetrical to face.  VII: Face is symmetric resting and smiling.  VIII: Hearing intact to voice IX, X: Phonation normal.  XII: Tongue protrudes midline without fasciculations.   Motor: 5/5 strength is all muscle groups.  Tone is normal. Bulk is normal.  Sensation: Intact to light touch bilaterally in all four extremities. No extinction to DSS present.  Coordination: FTN intact bilaterally. HKS  intact bilaterally. No pronator drift.  Gait: Deferred  NIHSS: 1a Level of Conscious.: 0 1b LOC Questions: 0 1c LOC Commands: 0 2 Best Gaze: 0 3 Visual: 0 4 Facial Palsy: 0 5a Motor Arm - left:0  5b Motor Arm - Right: 0 6a Motor Leg - Left: 0 6b Motor Leg - Right: 0 7 Limb Ataxia: 0 8 Sensory: 0 9 Best Language: 0 10 Dysarthria: 0 11 Extinct. and Inatten.: 0 TOTAL: 0   Labs I have reviewed labs in epic and the results pertinent to this consultation are:   CBC    Component Value Date/Time   WBC 5.9 10/12/2022 0956   RBC 5.18 (H) 10/12/2022 0956   HGB 14.8 10/12/2022 0956   HGB 15.3 10/02/2022 1510   HCT 45.1 10/12/2022 0956   HCT 45.9 10/02/2022 1510   PLT 313 10/12/2022 0956   PLT 333  10/02/2022 1510   MCV 87.1 10/12/2022 0956   MCV 88 10/02/2022 1510   MCH 28.6 10/12/2022 0956   MCHC 32.8 10/12/2022 0956   RDW 12.5 10/12/2022 0956   RDW 12.4 10/02/2022 1510   LYMPHSABS 2.4 10/02/2022 1510   MONOABS 0.4 11/16/2020 1143   EOSABS 0.2 10/02/2022 1510   BASOSABS 0.1 10/02/2022 1510    CMP     Component Value Date/Time   NA 140 10/12/2022 0956   NA 139 10/02/2022 1510   K 4.3 10/12/2022 0956   CL 105 10/12/2022 0956   CO2 26 10/12/2022 0956   GLUCOSE 107 (H) 10/12/2022 0956   BUN 16 10/12/2022 0956   BUN 20 10/02/2022 1510   CREATININE 0.73 10/12/2022 0956   CALCIUM 9.3 10/12/2022 0956   PROT 7.7 10/12/2022 0956   PROT 7.3 01/08/2022 1124   ALBUMIN 3.8 10/12/2022 0956   ALBUMIN 4.5 01/08/2022 1124   AST 27 10/12/2022 0956   ALT 27 10/12/2022 0956   ALKPHOS 96 10/12/2022 0956   BILITOT 0.4 10/12/2022 0956   BILITOT 0.4 01/08/2022 1124   GFRNONAA >60 10/12/2022 0956   GFRAA 120 04/15/2018 1105    Lipid Panel     Component Value Date/Time   CHOL 239 (H) 10/12/2022 0956   CHOL 225 (H) 01/08/2022 1124   TRIG 99 10/12/2022 0956   HDL 59 10/12/2022 0956   HDL 57 01/08/2022 1124   CHOLHDL 4.1 10/12/2022 0956   VLDL 20 10/12/2022 0956   LDLCALC  160 (H) 10/12/2022 0956   LDLCALC 143 (H) 01/08/2022 1124   LDLDIRECT 160 (H) 10/12/2022 0956     Imaging I have reviewed the images obtained:  CT-scan of the brain: No acute abnormality  Assessment: 52 year old patient with history of ADHD, stills disease, hearing loss, depression and migraines presented after cardiac cath procedure with headache and visual disturbance in left visual field.  Patient endorsed severe headache as well as multicolored, crescent shaped visual disturbance which started towards the center of her visual field and then moved to the periphery as time went on.  While patient does have a history of migraines, she has never had one with an aura before.  Suspect that this is complicated migraine exacerbated by nitroglycerin use to treat vasospasm, as visual disturbance is typical of a migraine aura.  Impression: Most likely complex migraine, but given significant vasospasm, emergent Head CT obtained  Code Stroke Recommendations: -CT head without contrast -Initially, plan was to obtain CTA to assess for potential intracranial vasospasm contributing to her symptoms.  However IV access was extremely challenging and given her symptoms were improving while attempting to obtain IV access decision was made to defer CTA unless she has worsening symptoms or new symptoms  Additional Recommendations: - Hourly NIHSS, vital signs and neurochecks for 4 hours, then every 2 hours for 4 hours - Consider verapamil as BP tolerates for vasospasm and headache management - Supportive care per primary team - Magnesium 2 grams (1 dose), tylenol 1 g every 6 hours prn, will hold off on Compazine and Benadryl to avoid sedation - Okay to add ketorolac if headache not responsive to Tylenol and no contraindications  - Stat head CT and code stroke reactivation for any decline in neurological exam - Please obtain proximal 18-gauge IV access in case CTA is needed for any neurological decline -  Neurology will be available as needed, recommendations conveyed to primary team via secure chat  Pt seen by NP/Neuro and later by MD.  Note/plan to be edited by MD as needed.  Cortney E Ernestina Columbia , MSN, AGACNP-BC [facility] See Amion for schedule and pager information 10/19/2022 11:21 AM    Brooke Dare MD-PhD [facility] [phone] Available 7 AM to 7 PM, outside these hours please contact Neurologist on call listed on AMION   Attending Neurologist's note:  I personally saw this patient, gathering history, performing a full neurologic examination, reviewing relevant labs, personally reviewing relevant imaging including head CT, and formulated the assessment and plan, adding the note above for completeness and clarity to accurately reflect my thoughts  CRITICAL CARE Performed by: Gordy Councilman   Total critical care time: 50 minutes  Critical care time was exclusive of separately billable procedures and treating other patients.  Critical care was necessary to treat or prevent imminent or life-threatening deterioration, emergent evaluation for consideration of thrombolytic or thrombectomy  Critical care was time spent personally by me on the following activities: development of treatment plan with patient and/or surrogate as well as nursing, discussions with consultants, evaluation of patient's response to treatment, examination of patient, obtaining history from patient or surrogate, ordering and performing treatments and interventions, ordering and review of laboratory studies, ordering and review of radiographic studies, pulse oximetry and re-evaluation of patient's condition.

## 2022-10-19 NOTE — Interval H&P Note (Signed)
History and Physical Interval Note:  10/19/2022 7:44 AM  Cassandra Robbins  has presented today for surgery, with the diagnosis of unstable angina with Abnormal Coronary CTA.  The various methods of treatment have been discussed with the patient and family. After consideration of risks, benefits and other options for treatment, the patient has consented to  Procedure(s): LEFT HEART CATH AND CORONARY ANGIOGRAPHY (N/A)  PERCUTANEOUS CORONARY INTERVENTION  as a surgical intervention.  The patient's history has been reviewed, patient examined, no change in status, stable for surgery.  I have reviewed the patient's chart and labs.  Questions were answered to the patient's satisfaction.    Cath Lab Visit (complete for each Cath Lab visit)  Clinical Evaluation Leading to the Procedure:   ACS: No.  Non-ACS:    Anginal Classification: CCS III  Anti-ischemic medical therapy: Minimal Therapy (1 class of medications)  Non-Invasive Test Results: High-risk stress test findings: cardiac mortality >3%/year  Prior CABG: No previous CABG    [name]

## 2022-10-20 ENCOUNTER — Other Ambulatory Visit (HOSPITAL_COMMUNITY): Payer: Self-pay

## 2022-10-20 DIAGNOSIS — I2510 Atherosclerotic heart disease of native coronary artery without angina pectoris: Secondary | ICD-10-CM | POA: Diagnosis not present

## 2022-10-20 DIAGNOSIS — M0820 Juvenile rheumatoid arthritis with systemic onset, unspecified site: Secondary | ICD-10-CM | POA: Diagnosis not present

## 2022-10-20 DIAGNOSIS — R931 Abnormal findings on diagnostic imaging of heart and coronary circulation: Secondary | ICD-10-CM | POA: Diagnosis not present

## 2022-10-20 DIAGNOSIS — E785 Hyperlipidemia, unspecified: Secondary | ICD-10-CM | POA: Diagnosis not present

## 2022-10-20 DIAGNOSIS — R519 Headache, unspecified: Secondary | ICD-10-CM | POA: Insufficient documentation

## 2022-10-20 DIAGNOSIS — Z9861 Coronary angioplasty status: Secondary | ICD-10-CM

## 2022-10-20 LAB — BASIC METABOLIC PANEL
Anion gap: 8 (ref 5–15)
BUN: 12 mg/dL (ref 6–20)
CO2: 25 mmol/L (ref 22–32)
Calcium: 8.5 mg/dL — ABNORMAL LOW (ref 8.9–10.3)
Chloride: 105 mmol/L (ref 98–111)
Creatinine, Ser: 0.75 mg/dL (ref 0.44–1.00)
GFR, Estimated: >60 mL/min (ref >60)
Glucose, Bld: 107 mg/dL — ABNORMAL HIGH (ref 70–99)
Potassium: 4.2 mmol/L (ref 3.5–5.1)
Sodium: 138 mmol/L (ref 135–145)

## 2022-10-20 LAB — CBC
HCT: 37.6 % (ref 36.0–46.0)
Hemoglobin: 12.2 g/dL (ref 12.0–15.0)
MCH: 28.3 pg (ref 26.0–34.0)
MCHC: 32.4 g/dL (ref 30.0–36.0)
MCV: 87.2 fL (ref 80.0–100.0)
Platelets: 261 10*3/uL (ref 150–400)
RBC: 4.31 MIL/uL (ref 3.87–5.11)
RDW: 12.5 % (ref 11.5–15.5)
WBC: 7.4 10*3/uL (ref 4.0–10.5)
nRBC: 0 % (ref 0.0–0.2)

## 2022-10-20 MED ORDER — CLOPIDOGREL BISULFATE 75 MG PO TABS
75.0000 mg | ORAL_TABLET | Freq: Every day | ORAL | 3 refills | Status: DC
  Filled 2022-10-20: qty 30, 30d supply, fill #0

## 2022-10-20 MED ORDER — PANTOPRAZOLE SODIUM 40 MG PO TBEC
40.0000 mg | DELAYED_RELEASE_TABLET | Freq: Every day | ORAL | 1 refills | Status: DC
  Filled 2022-10-20: qty 30, 30d supply, fill #0

## 2022-10-20 MED ORDER — NITROGLYCERIN 0.4 MG SL SUBL
0.4000 mg | SUBLINGUAL_TABLET | SUBLINGUAL | 3 refills | Status: DC | PRN
  Filled 2022-10-20: qty 25, 30d supply, fill #0

## 2022-10-20 NOTE — Discharge Summary (Signed)
Discharge Summary    Patient ID: Cassandra Robbins MRN: [id number]; DOB: Jan 07, 1971  Admit date: 10/19/2022 Discharge date: 10/20/2022  PCP:  [name], DO   [facility] Providers Cardiologist:  [name], MD        Discharge Diagnoses    Principal Problem:   CAD S/P percutaneous coronary angioplasty Active Problems:   Hyperlipidemia   New onset headache   Diagnostic Studies/Procedures    LHC: 10/19/2022      Prox LAD to Mid LAD lesion is 40% stenosed. => Notable spasm after PCI, relieved with nitroglycerin.   ---------------------------------------------------   Prox RCA to Mid RCA lesion is 80% stenosed.   A drug-eluting stent was successfully placed using a SYNERGY XD 3.50X16 => postdilated to 3.8 mm. Post intervention, there is a 0% residual stenosis.TIMI-3 flow pre and post   ---------------------------------------------------   LPAV lesion is 80% stenosed with 80% stenosed side branch in 2nd LPL.   Score score flex angioplasty performed followed by successful placement of a A drug-eluting stent using a SYNERGY XD 3.0X20.  Postdilated to 3.6 mm; Post intervention, there is a 0% residual stenosis -> going from LPA V into LPL 2; TIMI-3 flow pre and post   Mid RCA lesion is 30% stenosed.   ----------------------------------------------------   LV end diastolic pressure is normal.   There is no aortic valve stenosis.   ---------------------------------------------------   Recommend uninterrupted dual antiplatelet therapy with Aspirin 81mg  daily and Clopidogrel 75mg  daily for a minimum of 6 months (stable ischemic heart disease-Class I recommendation).   Will reduce to maintenance Plavix monotherapy 75 mg daily to complete additional 18 months.  Can be held for procedures after 6 months.   POST-CATH DIAGNOSES Severe two-vessel CAD involving mid RCA 80% stenosis and proximal (LPAV)-LPL-2 80% stenosis both were by FFR ct\ Successful DES PCI of proximal  to mid RCA a 80% stenosis reduced to 0% with Synergy DES 3.5 mm x 16 mm (postdilated to 3.8 mm). TIMI-3 flow pre and post. Successful DES PCI of LPAV/LPL 2 reducing 80% to 0% with Synergy DES 3.0 mm by 20 mm (postdilated to 3.6 mm.). TIMI-3 flow pre and post Proximal LAD 40% stenosis at SP1/D1 takeoff, also 20 to 30% mid RCA just distal to the significant lesion. Normal LVEDP Significant coronary spasm after post dilation of LCx lesion-spasm of the proximal LAD for percent stenosis resolved with IC and additional sedation.  She did have significant discomfort with this both the throat discomfort which was her angina as well as true substernal chest pressure. This resolved after nitroglycerin and additional sedation; it also coincided with brachial/radial arterial spasm on the guide catheter. Postprocedure patient had mild headache with visual after maladies. Code stroke called, neurology suspected nitroglycerin or spasm induced migraine headache but agreed with CT of head.      RECOMMENDATIONS Based on the 10 spasm, plan is to treat with IV NTG, however may change to verapamil per neurology. CT scan of the head to evaluate visual symptoms. Will monitor overnight outpatient with extended recovery.    History of Present Illness     Cassandra Robbins is a 52 y.o. female with past medical history of HLD and Stills' Disease who presented to [facility] on 10/19/2022 for planned cardiac catheterization following an abnormal Coronary CTA.   She had been evaluated for worsening dyspnea on exertion in 08/2022 with a Coronary CTA being ordered for further evaluation. This was performed on 10/08/2022 and showed a calcium  score of 0 but she did have noncalcified plaque along the RCA which was significant by FFR and cardiac catheterization was recommended for additional evaluation. This was reviewed with the patient at the time of her office visit on 10/12/2022 and she was in agreement to proceed. She was started on  ASA 81mg  daily and Atorvastatin 40mg  daily at the time of her visit.   Hospital Course     Consultants: Neurology   Catheterization was performed on 10/19/2022 and showed 40% proximal to mid-LAD stenosis, 80% proximal to mid-RCA stenosis and 80% LPAV stenosis. She underwent PCI with DES to the proximal to mid-RCA and DES to LPAV. She did have significant coronary spasm of the LAD with dilation of the LCx lesion which resolved with NTG and additional sedation. Was recommended to continue ASA and Plavix for a minimum of 6 months and reduce to Plavix monotherapy to complete an additional 18 months. She did have a mild headache and visual changes after her procedure. CODE STROKE wad called but imaging was negative and it was felt she likely had a NTG or spasm-induced migraine headache.   The following morning, she reported feeling well and denied any recurrent anginal symptoms. Was not started on a CCB due to resolution of symptoms. She ambulated over 940 ft with cardiac rehab without difficulty. She was examined by Dr. Cristal Deer and deemed stable for discharge. She does have previously [facility] on [facility].  Did the patient have an acute coronary syndrome (MI, NSTEMI, STEMI, etc) this admission?:  No                               Did the patient have a percutaneous coronary intervention (stent / angioplasty)?:  Yes.     Cath/PCI Registry Performance & Quality Measures: Aspirin prescribed? - Yes ADP Receptor Inhibitor (Plavix/Clopidogrel, Brilinta/Ticagrelor or Effient/Prasugrel) prescribed (includes medically managed patients)? - Yes High Intensity Statin (Lipitor 40-80mg  or Crestor 20-40mg ) prescribed? - Yes For EF <40%, was ACEI/ARB prescribed? - Not Applicable (EF >/= 40%) For EF <40%, Aldosterone Antagonist (Spironolactone or Eplerenone) prescribed? - Not Applicable (EF >/= 40%) Cardiac Rehab Phase II ordered? - Yes         _____________  Discharge Physical Exam  and Vitals Blood pressure (!) 164/93, pulse 82, temperature 98 F (36.7 C), resp. rate 18, height 5\' 10"  (1.778 m), weight 112.4 kg, SpO2 99 %.  Filed Weights   10/19/22 0554 10/20/22 0639  Weight: 114.8 kg 112.4 kg   General: Pleasant female appearing in NAD Psych: Normal affect. Neuro: Alert and oriented X 3. Moves all extremities spontaneously. HEENT: Normal  Neck: Supple without bruits or JVD. Lungs:  Resp regular and unlabored, CTA without wheezing or rales. Heart: RRR no s3, s4, or murmurs. Abdomen: Soft, non-tender, non-distended, BS + x 4.  Extremities: No clubbing, cyanosis or pitting edema. DP/PT/Radials 2+ and equal bilaterally. Cath site stable with no evidence of a hematoma.    Labs & Radiologic Studies    CBC Recent Labs    10/20/22 0242  WBC 7.4  HGB 12.2  HCT 37.6  MCV 87.2  PLT 261   Basic Metabolic Panel Recent Labs    96/04/54 0242  NA 138  K 4.2  CL 105  CO2 25  GLUCOSE 107*  BUN 12  CREATININE 0.75  CALCIUM 8.5*   Liver Function Tests No results for input(s): "AST", "ALT", "ALKPHOS", "BILITOT", "PROT", "ALBUMIN" in  the last 72 hours. No results for input(s): "LIPASE", "AMYLASE" in the last 72 hours. High Sensitivity Troponin:   No results for input(s): "TROPONINIHS" in the last 720 hours.  BNP Invalid input(s): "POCBNP" D-Dimer No results for input(s): "DDIMER" in the last 72 hours. Hemoglobin A1C No results for input(s): "HGBA1C" in the last 72 hours. Fasting Lipid Panel No results for input(s): "CHOL", "HDL", "LDLCALC", "TRIG", "CHOLHDL", "LDLDIRECT" in the last 72 hours. Thyroid Function Tests No results for input(s): "TSH", "T4TOTAL", "T3FREE", "THYROIDAB" in the last 72 hours.  Invalid input(s): "FREET3" _____________  CT HEAD CODE STROKE WO CONTRAST  Result Date: 10/19/2022 CLINICAL DATA:  Code stroke. Neuro deficit, acute, stroke suspected. Left hemianopsia. EXAM: CT HEAD WITHOUT CONTRAST TECHNIQUE: Contiguous axial images  were obtained from the base of the skull through the vertex without intravenous contrast. RADIATION DOSE REDUCTION: This exam was performed according to the departmental dose-optimization program which includes automated exposure control, adjustment of the mA and/or kV according to patient size and/or use of iterative reconstruction technique. COMPARISON:  None Available. FINDINGS: Brain: Circulating contrast from recent cardiac catheterization. Within this limitation, no acute intracranial hemorrhage. Gray-white differentiation is preserved. No hydrocephalus or extra-axial collection. No mass effect or midline shift. Vascular: Circulating contrast from recent cardiac catheterization. Normal vessel enhancement. Skull: No calvarial fracture or suspicious bone lesion. Skull base is unremarkable. Sinuses/Orbits: Unremarkable. Other: None. ASPECTS (Alberta Stroke Program Early CT Score) - Ganglionic level infarction (caudate, lentiform nuclei, internal capsule, insula, M1-M3 cortex): 7 - Supraganglionic infarction (M4-M6 cortex): 3 Total score (0-10 with 10 being normal): 10 IMPRESSION: No acute intracranial hemorrhage or evidence of acute large vessel territory infarction. ASPECT score is 10. Code stroke imaging results were communicated on 10/19/2022 at 10:45 am to provider Dr. Iver Nestle via telephone, who verbally acknowledged these results. Electronically Signed   By: Orvan Falconer M.D.   On: 10/19/2022 10:45    Disposition   Pt is being discharged home today in good condition.  Follow-up Plans & Appointments     Follow-up Information     Sondra Barges, PA-C Follow up on 11/02/2022.   Specialties: Cardiology, Radiology Why: Keep scheduled follow-up for 11/02/2022 at 8:25 AM. Contact information: [address] [place] [address] [phone]                Discharge Instructions     AMB Referral to Cardiac Rehabilitation - Phase II   Complete by: As directed    Diagnosis:   Stable Angina Coronary Stents     After initial evaluation and assessments completed: Virtual Based Care may be provided alone or in conjunction with Phase 2 Cardiac Rehab based on patient barriers.: Yes   Intensive Cardiac Rehabilitation (ICR) [facility] location only OR Traditional Cardiac Rehabilitation (TCR) *If criteria for ICR are not met will enroll in TCR [facility] only): Yes   Discharge instructions   Complete by: As directed    PLEASE REMEMBER TO BRING ALL OF YOUR MEDICATIONS TO EACH OF YOUR FOLLOW-UP OFFICE VISITS.  PLEASE ATTEND ALL SCHEDULED FOLLOW-UP APPOINTMENTS.   Activity: Increase activity slowly as tolerated. You may shower, but no soaking baths (or swimming) for 1 week. No driving for 24 hours. No lifting over 5 lbs for 1 week. No sexual activity for 1 week.   You May Return to Work: in 1 week (if applicable)  Wound Care: You may wash cath site gently with soap and water. Keep cath site clean and dry. If you notice  pain, swelling, bleeding or pus at your cath site, please call [phone].   Increase activity slowly   Complete by: As directed         Discharge Medications   Allergies as of 10/20/2022       Reactions   Penicillins Anaphylaxis   Amoxicillin Rash        Medication List     TAKE these medications    acetaminophen 500 MG tablet Commonly known as: TYLENOL Take 1,000 mg by mouth every 8 (eight) hours as needed for moderate pain.   amphetamine-dextroamphetamine 20 MG tablet Commonly known as: ADDERALL Take 1 tablet (20 mg total) by mouth 2 (two) times daily.   aspirin EC 81 MG tablet Take 1 tablet (81 mg total) by mouth daily. Swallow whole.   atorvastatin 40 MG tablet Commonly known as: LIPITOR Take 1 tablet (40 mg total) by mouth daily.   budesonide-formoterol 160-4.5 MCG/ACT inhaler Commonly known as: SYMBICORT Inhale 2 puffs into the lungs 2 (two) times daily. What changed:  when to take this reasons to take this   clopidogrel 75 MG  tablet Commonly known as: PLAVIX Take 1 tablet (75 mg total) by mouth daily with breakfast. Start taking on: October 21, 2022   fluticasone 50 MCG/ACT nasal spray Commonly known as: FLONASE Place 2 sprays into both nostrils daily.   ibuprofen 200 MG tablet Commonly known as: ADVIL Take 600 mg by mouth every 8 (eight) hours as needed for moderate pain.   ketoconazole 2 % cream Commonly known as: NIZORAL Apply 1 Application topically daily. What changed:  when to take this reasons to take this   nitroGLYCERIN 0.4 MG SL tablet Commonly known as: NITROSTAT Place 1 tablet (0.4 mg total) under the tongue every 5 (five) minutes as needed for chest pain.   pantoprazole 40 MG tablet Commonly known as: PROTONIX Take 1 tablet (40 mg total) by mouth daily.   sodium chloride 0.65 % Soln nasal spray Commonly known as: OCEAN Place 1 spray into both nostrils as needed for congestion.           Outstanding Labs/Studies   FLP and LFT's in 6-8 weeks.   Duration of Discharge Encounter   Greater than 30 minutes including physician time.  Signed, Ellsworth Lennox, PA-C 10/20/2022, 1:11 PM

## 2022-10-20 NOTE — Progress Notes (Signed)
CARDIAC REHAB PHASE I   PRE:  Rate/Rhythm: 83 SR  BP:  Sitting: 155/89      SaO2: 98 RA  MODE:  Ambulation: 940 ft   POST:  Rate/Rhythm: 101 ST  BP:  Sitting: 158/81      SaO2: 98 RA  Pt tolerated exercise well and amb 940 ft independently. Pt denies CP, SOB, or dizziness throughout walk. Ed given to pt and spouse. Discussed restrictions, ASA and Plavix, stent, site care, heart healthy diet, and exercise guidelines. Pt left in bed w/ call bell in reach. Will refer to [facility].   Will continue to follow.  [id number] Joya San, MS, ACSM-CEP 10/20/2022 8:30 AM

## 2022-10-20 NOTE — Plan of Care (Signed)

## 2022-10-22 ENCOUNTER — Encounter (HOSPITAL_COMMUNITY): Payer: Self-pay | Admitting: Cardiology

## 2022-10-23 ENCOUNTER — Ambulatory Visit: Payer: 59 | Admitting: Physician Assistant

## 2022-10-23 LAB — LIPOPROTEIN A (LPA): Lipoprotein (a): 52.7 nmol/L — ABNORMAL HIGH (ref <75.0)

## 2022-10-25 ENCOUNTER — Ambulatory Visit: Admit: 2022-10-25 | Payer: 59 | Admitting: Cardiology

## 2022-10-25 SURGERY — LEFT HEART CATH AND CORONARY ANGIOGRAPHY
Anesthesia: Moderate Sedation | Laterality: Left

## 2022-10-29 DIAGNOSIS — Z419 Encounter for procedure for purposes other than remedying health state, unspecified: Secondary | ICD-10-CM | POA: Diagnosis not present

## 2022-10-31 NOTE — Progress Notes (Signed)
Office Visit    Patient Name: Cassandra Robbins Date of Encounter: 11/02/2022  Primary Care Provider:  [name], DO Primary Cardiologist:  [name], MD  Chief Complaint  52 year old female with past medical history of CAD s/p percutaneous coronary angioplasty with DES placement to the the RCA and LPAV, hyperlipidemia, and stills disease. She presents today for follow up post heart catheterization.   Past Medical History    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HCC)    Toe fracture, right 10/08/2017   Past Surgical History:  Procedure Laterality Date   COLONOSCOPY WITH PROPOFOL N/A 02/21/2022   Procedure: COLONOSCOPY WITH PROPOFOL;  Surgeon: Toney Reil, MD;  Location: [facility] ENDOSCOPY;  Service: Gastroenterology;  Laterality: N/A;   CORONARY STENT INTERVENTION N/A 10/19/2022   Procedure: CORONARY STENT INTERVENTION;  Surgeon: Marykay Lex, MD;  Location: [facility] INVASIVE CV LAB;  Service: Cardiovascular;  Laterality: N/A;   LAPAROSCOPIC ENDOMETRIOSIS FULGURATION  2007   LEFT HEART CATH AND CORONARY ANGIOGRAPHY N/A 10/19/2022   Procedure: LEFT HEART CATH AND CORONARY ANGIOGRAPHY;  Surgeon: Marykay Lex, MD;  Location: [facility] INVASIVE CV LAB;  Service: Cardiovascular;  Laterality: N/A;   SEPTOPLASTY  2005   TUBAL LIGATION     Allergies Allergies  Allergen Reactions   Penicillins Anaphylaxis   Amoxicillin Rash   Labs/Other Studies Reviewed    The following studies were reviewed today: Cardiac Studies & Procedures   CARDIAC CATHETERIZATION  CARDIAC CATHETERIZATION 10/22/2022  Narrative   Prox LAD to Mid LAD lesion is 40% stenosed. => Notable spasm after PCI, relieved with nitroglycerin.   ---------------------------------------------------   Prox RCA to Mid RCA lesion is  80% stenosed.   A drug-eluting stent was successfully placed using a SYNERGY XD 3.50X16 => postdilated to 3.8 mm. Post intervention, there is a 0% residual stenosis.TIMI-3 flow pre and post   ---------------------------------------------------   LPAV lesion is 80% stenosed with 80% stenosed side branch in 2nd LPL.   Score score flex angioplasty performed followed by successful placement of a A drug-eluting stent using a SYNERGY XD 3.0X20.  Postdilated to 3.6 mm; Post intervention, there is a 0% residual stenosis -> going from LPA V into LPL 2; TIMI-3 flow pre and post   Mid RCA lesion is 30% stenosed.   ----------------------------------------------------   LV end diastolic pressure is normal.   There is no aortic valve stenosis.   ---------------------------------------------------   Recommend uninterrupted dual antiplatelet therapy with Aspirin 81mg  daily and Clopidogrel 75mg  daily for a minimum of 6 months (stable ischemic heart disease-Class I recommendation).   Will reduce to maintenance Plavix monotherapy 75 mg daily to complete additional 18 months.  Can be held for procedures after 6 months.  POST-CATH DIAGNOSES Severe two-vessel CAD involving mid RCA 80% stenosis and proximal (LPAV)-LPL-2 80% stenosis both were by FFR ct\ Successful DES PCI of proximal to mid RCA a 80% stenosis reduced to 0% with Synergy DES 3.5 mm x 16 mm (postdilated to 3.8 mm). TIMI-3 flow pre and post. Successful DES PCI of LPAV/LPL 2 reducing 80% to 0% with Synergy DES 3.0 mm by 20 mm (postdilated to 3.6 mm.). TIMI-3 flow pre and post Proximal LAD 40% stenosis at SP1/D1 takeoff, also 20 to  30% mid RCA just distal to the significant lesion. Normal LVEDP Significant coronary spasm after post dilation of LCx lesion-spasm of the proximal LAD for percent stenosis resolved with IC and additional sedation. She did have significant discomfort with this both the throat discomfort which was her angina as well as true  substernal chest pressure. This resolved after nitroglycerin and additional sedation; it also coincided with brachial/radial arterial spasm on the guide catheter. Postprocedure patient had mild headache with visual after maladies. Code stroke called, neurology suspected nitroglycerin or spasm induced migraine headache but agreed with CT of head.   RECOMMENDATIONS Based on the 10 spasm, plan is to treat with IV NTG, however may change to verapamil per neurology. CT scan of the head to evaluate visual symptoms. Will monitor overnight outpatient with extended recovery.    [name], MD  Findings Coronary Findings Diagnostic  Dominance: Co-dominant  Left Anterior Descending Vessel is large. Prox LAD to Mid LAD lesion is 40% stenosed. The lesion is located at the bifurcation and eccentric.  First Diagonal Branch Vessel is small in size.  Left Circumflex Vessel was injected. Vessel is large.  First Obtuse Marginal Branch Vessel is small in size.  Second Obtuse Marginal Branch Vessel is large in size.  Third Obtuse Marginal Branch Vessel is small in size.  First Left Posterolateral Branch Vessel is small in size.  Second Left Posterolateral Branch Vessel is large in size.  Left Posterior Atrioventricular Artery Vessel is large in size. LPAV lesion is 80% stenosed with 80% stenosed side branch in 2nd LPL. The lesion is eccentric. Fibrotic  Right Coronary Artery Vessel was injected. Vessel is large. There is severe focal disease in the vessel. Prox RCA to Mid RCA lesion is 80% stenosed. Vessel is the culprit lesion. The lesion is type B1, located proximal to the major branch and segmental. Mid RCA lesion is 30% stenosed.  Acute Marginal Branch Vessel is small in size.  Right Ventricular Branch Vessel is small in size.  Intervention  LPAV lesion with side branch in 2nd LPL Stent - Main Branch Lesion length:  16 mm. CATH VISTA GUIDE 6FR XB3.5 guide catheter was  inserted. Lesion crossed with guidewire using a WIRE ASAHI PROWATER 180CM. Pre-stent angioplasty was performed using a BALLN SCOREFLEX 3.0X15. Maximum pressure:  12 atm. Inflation time:  20 sec. BALLN EMERGE MR 3.0X12 -&gt; several inflations, however the balloon watermelon seeded and would not fully expand lesion. A drug-eluting stent was successfully placed using a SYNERGY XD 3.0X20. Maximum pressure: 16 atm. Inflation time: 30 sec. Stent strut is well apposed. Postdilated to 3.6 mm Post-stent angioplasty was performed using a BALLN [place] EMERGE MR 3.5X15. Maximum pressure:  16 atm. Inflation time:  20 sec. 2 inflations Post-Intervention Lesion Assessment The intervention was successful. Pre-interventional TIMI flow is 3. Post-intervention TIMI flow is 3. Treated lesion length:  20 mm. Vasospasm occurred during the intervention. IC nitroglycerin was given. Very spasm of the LAD noted Patient had intense vasospasm of LAD as well as a brachial/radial arteries.  Resolved with 3 doses of IC NTG and she was started on IV NTG.  Was also given additional 1 mg Versed and 50 mcg fentanyl.  Was pain-free upon completion of procedure. She also noted visual disturbances-code stroke called. There is a 0% residual stenosis in the main branch post intervention. There is a 0% residual stenosis in the side branch post intervention.  Prox RCA to Mid RCA lesion Stent Lesion length:  16 mm. CATH VISTA  GUIDE 6FR JR4 guide catheter was inserted. Lesion crossed with guidewire using a WIRE ASAHI PROWATER 180CM. Pre-stent angioplasty was performed using a BALLN EMERGE MR 2.5X12. Maximum pressure:  10 atm. Inflation time: 20 sec. A drug-eluting stent was successfully placed using a SYNERGY XD 3.50X16. Maximum pressure: 16 atm. Inflation time: 30 sec. Stent strut is well apposed. Postdilated to 3.8 mm Post-stent angioplasty was performed using a BALLN [place] EMERGE MR [id number]. Maximum pressure:  14 atm. Inflation time:  20 sec. 2  inflations Post-Intervention Lesion Assessment The intervention was successful. Pre-interventional TIMI flow is 3. Post-intervention TIMI flow is 3. Treated lesion length:  16 mm. No complications occurred at this lesion. There is a 0% residual stenosis post intervention.     ECHOCARDIOGRAM  ECHOCARDIOGRAM COMPLETE 10/17/2022  Narrative ECHOCARDIOGRAM REPORT    Patient Name:   CLAIBORNE KUSHMAN Date of Exam: 10/17/2022 Medical Rec #:  [id number]       Height:       70.0 in Accession #:    [id number]      Weight:       253.6 lb Date of Birth:  [birth year]       BSA:          2.309 m Patient Age:    52 years        BP:           132/72 mmHg Patient Gender: F               HR:           84 bpm. Exam Location:  [place]  Procedure: 2D Echo, 3D Echo, Cardiac Doppler, Color Doppler and Strain Analysis  Indications:    R06.02 SOB; R07.9* Chest pain, unspecified  History:        Patient has no prior history of Echocardiogram examinations. Signs/Symptoms:Chest Pain and Shortness of Breath; Risk Factors:Dyslipidemia and Non-Smoker.  Sonographer:    Quentin Ore RDMS, RVT, RDCS Referring Phys: [id number] DAVID W HARDING  IMPRESSIONS   1. Left ventricular ejection fraction, by estimation, is 55 to 60%. Left ventricular ejection fraction by 2D MOD biplane is 56.9 %. The left ventricle has normal function. The left ventricle has no regional wall motion abnormalities. Left ventricular diastolic parameters were normal. The average left ventricular global longitudinal strain is -22.2 %. The global longitudinal strain is normal. 2. Right ventricular systolic function is normal. The right ventricular size is normal. 3. The mitral valve is normal in structure. Mild mitral valve regurgitation. 4. The aortic valve was not well visualized. Aortic valve regurgitation is not visualized. 5. The inferior vena cava is normal in size with greater than 50% respiratory variability, suggesting right atrial pressure  of 3 mmHg.  FINDINGS Left Ventricle: Left ventricular ejection fraction, by estimation, is 55 to 60%. Left ventricular ejection fraction by 2D MOD biplane is 56.9 %. The left ventricle has normal function. The left ventricle has no regional wall motion abnormalities. The average left ventricular global longitudinal strain is -22.2 %. The global longitudinal strain is normal. The left ventricular internal cavity size was normal in size. There is no left ventricular hypertrophy. Left ventricular diastolic parameters were normal.  Right Ventricle: The right ventricular size is normal. No increase in right ventricular wall thickness. Right ventricular systolic function is normal.  Left Atrium: Left atrial size was normal in size.  Right Atrium: Right atrial size was normal in size.  Pericardium: There is no evidence of pericardial effusion.  Mitral Valve:  The mitral valve is normal in structure. Mild mitral valve regurgitation.  Tricuspid Valve: The tricuspid valve is normal in structure. Tricuspid valve regurgitation is not demonstrated.  Aortic Valve: The aortic valve was not well visualized. Aortic valve regurgitation is not visualized. Aortic valve mean gradient measures 4.0 mmHg. Aortic valve peak gradient measures 6.9 mmHg. Aortic valve area, by VTI measures 2.84 cm.  Pulmonic Valve: The pulmonic valve was not well visualized. Pulmonic valve regurgitation is not visualized.  Aorta: The aortic root is normal in size and structure.  Venous: The inferior vena cava is normal in size with greater than 50% respiratory variability, suggesting right atrial pressure of 3 mmHg.  IAS/Shunts: No atrial level shunt detected by color flow Doppler.   LEFT VENTRICLE PLAX 2D                        Biplane EF (MOD) LVIDd:         5.10 cm         LV Biplane EF:   Left LVIDs:         3.70 cm                          ventricular LV PW:         0.80 cm                          ejection LV IVS:         0.90 cm                          fraction by LVOT diam:     2.00 cm                          2D MOD LV SV:         74                               biplane is LV SV Index:   32                               56.9 %. LVOT Area:     3.14 cm Diastology LV e' medial:    11.40 cm/s LV Volumes (MOD)               LV E/e' medial:  7.1 LV vol d, MOD    103.0 ml      LV e' lateral:   11.10 cm/s A2C:                           LV E/e' lateral: 7.3 LV vol d, MOD    111.0 ml A4C:                           2D LV vol s, MOD    45.6 ml       Longitudinal A2C:                           Strain LV vol s, MOD    50.7 ml  2D Strain GLS  -22.2 % A4C:                           Avg: LV SV MOD A2C:   57.4 ml LV SV MOD A4C:   111.0 ml LV SV MOD BP:    64.0 ml 3D Volume EF: 3D EF:        60 % LV EDV:       138 ml LV ESV:       55 ml LV SV:        83 ml  RIGHT VENTRICLE             IVC RV Basal diam:  3.60 cm     IVC diam: 2.30 cm RV S prime:     16.50 cm/s TAPSE (M-mode): 2.9 cm  LEFT ATRIUM             Index        RIGHT ATRIUM           Index LA diam:        3.60 cm 1.56 cm/m   RA Area:     16.90 cm LA Vol (A2C):   67.8 ml 29.36 ml/m  RA Volume:   42.90 ml  18.58 ml/m LA Vol (A4C):   40.1 ml 17.37 ml/m LA Biplane Vol: 55.3 ml 23.95 ml/m AORTIC VALVE                    PULMONIC VALVE AV Area (Vmax):    2.52 cm     PV Vmax:       1.31 m/s AV Area (Vmean):   2.61 cm     PV Peak grad:  6.9 mmHg AV Area (VTI):     2.84 cm AV Vmax:           131.50 cm/s AV Vmean:          88.450 cm/s AV VTI:            0.258 m AV Peak Grad:      6.9 mmHg AV Mean Grad:      4.0 mmHg LVOT Vmax:         105.50 cm/s LVOT Vmean:        73.350 cm/s LVOT VTI:          0.234 m LVOT/AV VTI ratio: 0.91  AORTA Ao Root diam: 2.70 cm Ao Asc diam:  2.40 cm Ao Arch diam: 2.7 cm  MITRAL VALVE MV Area (PHT): 3.27 cm    SHUNTS MV Decel Time: 232 msec    Systemic VTI:  0.23 m MV E velocity: 81.20 cm/s  Systemic  Diam: 2.00 cm MV A velocity: 80.70 cm/s MV E/A ratio:  1.01  Debbe Odea MD Electronically signed by Debbe Odea MD Signature Date/Time: 10/17/2022/12:55:32 PM    Final     CT SCANS  CT CORONARY MORPH W/CTA COR W/SCORE 10/14/2022  Addendum 10/14/2022  8:31 PM ADDENDUM REPORT: 10/14/2022 20:29  EXAM: OVER-READ INTERPRETATION  CT CHEST  The following report is an over-read performed by radiologist Dr. Katheren Puller [facility] on 10/14/2022. This over-read does not include interpretation of cardiac or coronary anatomy or pathology. The coronary CTA interpretation by the cardiologist is attached.  COMPARISON:  07/27/2022  FINDINGS: Cardiovascular: The heart is normal in size.  Mediastinum/Nodes: Visualized esophagus and trachea within normal limits. No mediastinal lymphadenopathy.  Lungs/Pleura: The lungs are clear bilaterally.  Upper Abdomen: The visualized upper abdomen is within normal limits.  Musculoskeletal: No acute osseous abnormality.  IMPRESSION: No acute or significant extracardiac, intrathoracic abnormality.  Marliss Coots, MD  [facility]  [facility]   Electronically Signed By: Marliss Coots M.D. On: 10/14/2022 20:29  Narrative CLINICAL DATA:  Chest pain  EXAM: Cardiac/Coronary  CTA  TECHNIQUE: The patient was scanned on a Siemens Somatom go.Top scanner.  : A retrospective scan was triggered in the ascending thoracic aorta. Axial non-contrast 3 mm slices were carried out through the heart. The data set was analyzed on a dedicated work station and scored using the Agatson method. Gantry rotation speed was 330 msecs and collimation was .6 mm. 100mg  of metoprolol and 0.8 mg of sl NTG was given. The 3D data set was reconstructed in 5% intervals of the 60-95 % of the R-R cycle. Diastolic phases were analyzed on a dedicated work station using MPR, MIP and VRT modes. The  patient received 75 cc of contrast.  FINDINGS: Aorta:  Normal size.  No calcifications.  No dissection.  Aortic Valve:  Trileaflet.  No calcifications.  Coronary Arteries:  Normal coronary origin.  Right dominance.  RCA is a dominant artery. There is non calcified plaque proximally causing moderate stenosis (50-69%).  Left main gives rise to LAD and LCX arteries. LM has no disease.  LAD has no plaque.  LCX is a non-dominant artery.  There is no plaque.  Other findings:  Normal pulmonary vein drainage into the left atrium.  Normal left atrial appendage without a thrombus.  Normal size of the pulmonary artery.  IMPRESSION: 1. Coronary calcium score of 0.  2. Normal coronary origin with right dominance.  3. Non calcified plaque in the proximal RCA causing moderate stenosis (50-69%).  4. CAD-RADS 3. Moderate stenosis. Consider symptom-guided anti-ischemic pharmacotherapy as well as risk factor modification per guideline directed care.  5. Additional analysis with CT FFR will be submitted and reported separately.  Electronically Signed: By: Debbe Odea M.D. On: 10/08/2022 15:41         Recent Labs: 10/02/2022: BNP 3.8 10/12/2022: ALT 27 10/20/2022: BUN 12; Creatinine, Ser 0.75; Hemoglobin 12.2; Platelets 261; Potassium 4.2; Sodium 138  Recent Lipid Panel    Component Value Date/Time   CHOL 239 (H) 10/12/2022 0956   CHOL 225 (H) 01/08/2022 1124   TRIG 99 10/12/2022 0956   HDL 59 10/12/2022 0956   HDL 57 01/08/2022 1124   CHOLHDL 4.1 10/12/2022 0956   VLDL 20 10/12/2022 0956   LDLCALC 160 (H) 10/12/2022 0956   LDLCALC 143 (H) 01/08/2022 1124   LDLDIRECT 160 (H) 10/12/2022 4098    History of Present Illness  52 year old female with past medical history of CAD s/p percutaneous coronary angioplasty with DES placement to the the RCA and LPAV, hyperlipidemia, and stills disease.   She was first seen by Dr. Herbie Baltimore on 09/06/22 after referral from ENT. She had  been noting continued shortness of breath and stridor following an URI in January/February with cough for almost 2 months, described as breathing through a filter. Also noted difficulty taking a deep breath, worsening snoring and worsening reflux. Per patient her heart rate at rest was increased and increased further with exertion, she denied chest tightness or pressure but noted a tightening occurring in the lower neck/throat. Overall atypical features but could not exclude angina, particularly with family history.    On 10/08/22 she had a coronary CTA. Her coronary calcium  score was 0.  Noncalcified plaque in the right coronary artery with moderate to severe significance (FFRCT 0.72) was noted. Based on these results cardiac catheterization was recommended.  On 10/19/2022 she underwent left heart catheterization.  This indicated a 40% proximal to mid LAD stenosis, 80% proximal to mid RCA stenosis and 80% LPAV stenosis.  She underwent PCI with DES to the proximal mid RCA and DES to LPAV.  Dilation of the LCx lesion she had significant coronary spasm of the LAD that was resolved with nitroglycerin and additional sedation.  Recommended to continue aspirin and Plavix for a minimum of 6 months and reduce to Plavix monotherapy to complete an additional 18 months.  Following procedure she noted to have a mild headache and visual changes.  A code stroke was called, imaging was negative. Neurology felt she likely had a nitroglycerin or spasm induced migraine headache.  The following morning she reported feeling well and denied recurrent anginal symptoms was not started on a calcium channel blocker due to resolution of symptoms.  Today she presents for follow up post catheterization. Reports she is doing very well. She no longer has lower extremity swelling present, her choking or throat tightening sensation is now gone. She denies symptoms of further spasms. Notes some dizziness with position changes, self resolves in  a few seconds. She is planning to go on road trip next week with friends that are in healthcare. Notes this is the best she has felt in months.   Home Medications    Current Outpatient Medications  Medication Sig Dispense Refill   acetaminophen (TYLENOL) 500 MG tablet Take 1,000 mg by mouth every 8 (eight) hours as needed for moderate pain.     amphetamine-dextroamphetamine (ADDERALL) 20 MG tablet Take 1 tablet (20 mg total) by mouth 2 (two) times daily. 60 tablet 0   aspirin EC 81 MG tablet Take 1 tablet (81 mg total) by mouth daily. Swallow whole. 90 tablet 3   atorvastatin (LIPITOR) 40 MG tablet Take 1 tablet (40 mg total) by mouth daily. 90 tablet 3   budesonide-formoterol (SYMBICORT) 160-4.5 MCG/ACT inhaler Inhale 2 puffs into the lungs 2 (two) times daily. (Patient taking differently: Inhale 2 puffs into the lungs 2 (two) times daily as needed (asthma).) 1 each 3   fluticasone (FLONASE) 50 MCG/ACT nasal spray Place 2 sprays into both nostrils daily. 16 g 6   ibuprofen (ADVIL) 200 MG tablet Take 600 mg by mouth every 8 (eight) hours as needed for moderate pain.     ketoconazole (NIZORAL) 2 % cream Apply 1 Application topically daily. (Patient taking differently: Apply 1 Application topically daily as needed for irritation.) 30 g 0   sodium chloride (OCEAN) 0.65 % SOLN nasal spray Place 1 spray into both nostrils as needed for congestion.     clopidogrel (PLAVIX) 75 MG tablet Take 1 tablet (75 mg total) by mouth daily with breakfast. 90 tablet 3   nitroGLYCERIN (NITROSTAT) 0.4 MG SL tablet Place 1 tablet (0.4 mg total) under the tongue every 5 (five) minutes as needed for chest pain. 25 tablet 3   pantoprazole (PROTONIX) 40 MG tablet Take 1 tablet (40 mg total) by mouth daily. 90 tablet 1   No current facility-administered medications for this visit.    Review of Systems   She denies chest pain, palpitations, dyspnea, pnd, orthopnea, n, v, syncope, edema, weight gain, or early satiety. All  other systems reviewed and are otherwise negative except as noted above.  Cardiac Rehabilitation Eligibility Assessment      Physical Exam    VS:  BP 128/82 (BP Location: Left Arm, Patient Position: Sitting, Cuff Size: Large)   Pulse 69   Ht 5\' 10"  (1.778 m)   Wt 250 lb 3.2 oz (113.5 kg)   SpO2 98%   BMI 35.90 kg/m  , BMI Body mass index is 35.9 kg/m.     GEN: Well nourished, well developed, in no acute distress. HEENT: normal. Neck: Supple, no JVD, carotid bruits, or masses. Cardiac: RRR, no murmurs, rubs, or gallops. No clubbing, cyanosis, edema.  Radials/DP/PT 2+ and equal bilaterally. Right radial cath site clean, dry and intact, no evidence of hematoma.  Respiratory:  Respirations regular and unlabored, clear to auscultation bilaterally. GI: Soft, nontender, nondistended, BS + x 4. MS: no deformity or atrophy. Skin: warm and dry, no rash. Neuro:  Strength and sensation are intact. Psych: Normal affect.  Accessory Clinical Findings    ECG personally reviewed by me today - EKG Interpretation Date/Time:  Friday November 02 2022 08:24:17 EDT Ventricular Rate:  69 PR Interval:  146 QRS Duration:  78 QT Interval:  400 QTC Calculation: 428 R Axis:   -6  Text Interpretation: Normal sinus rhythm Minimal voltage criteria for LVH, may be normal variant ( R in aVL ) When compared with ECG of 20-Oct-2022 06:35, No significant change was found Confirmed by Reather Littler [phone]) on 11/02/2022 8:31:29 AM  - no acute changes.   Lab Results  Component Value Date   WBC 7.4 10/20/2022   HGB 12.2 10/20/2022   HCT 37.6 10/20/2022   MCV 87.2 10/20/2022   PLT 261 10/20/2022   Lab Results  Component Value Date   CREATININE 0.75 10/20/2022   BUN 12 10/20/2022   NA 138 10/20/2022   K 4.2 10/20/2022   CL 105 10/20/2022   CO2 25 10/20/2022   Lab Results  Component Value Date   ALT 27 10/12/2022   AST 27 10/12/2022   ALKPHOS 96 10/12/2022   BILITOT 0.4 10/12/2022   Lab Results   Component Value Date   CHOL 239 (H) 10/12/2022   HDL 59 10/12/2022   LDLCALC 160 (H) 10/12/2022   LDLDIRECT 160 (H) 10/12/2022   TRIG 99 10/12/2022   CHOLHDL 4.1 10/12/2022    Lab Results  Component Value Date   HGBA1C 6.1 (H) 01/08/2022    Assessment & Plan   CAD: s/p LHC on 10/19/22 This indicated a 40% proximal to mid LAD stenosis, 80% proximal to mid RCA stenosis and 80% LPAV stenosis.  She underwent PCI with DES to the proximal mid RCA and DES to LPAV. During dilation of the Lcx she had significant spasm of the LAD, resolved with nitroglycerin. Following procedure a code stroke was called in setting of headache and vision changes, imaging negative, neurology felt to be migraine from spasm or nitroglycerin. On 10/20/22 she was discharged in stable condition on aspirin, atorvastatin, clopidogrel, and nitroglycerin as needed. Recommended to continue aspirin and plavix for six months, then reduce to plavix monotherapy for an additional 18 months. Today she reports she is doing well, she has had no further sensation of throat tightening (anginal equivalent) and her lower extremity edema has resolved.  Cath site clean, dry and intact with no evidence of hematoma. Blood pressure today 128/82. Heart healthy diet and regular cardiovascular exercise encouraged.  She states she is ready to start cardiac rehab. Continue aspirin, atorvastatin, plavix and nitroglycerin as needed.   Hyperlipidemia:  Last lipid profile on 10/12/22 indicated total cholesterol of 239 and LDL of 160. She was started on atorvastatin 40mg  daily at that visit. On 10/20/22 her lipoprotein (a) was 52.7. Continue atorvastatin 40mg  daily, to have fasting lipid panel and LFTs recheck in 6-8 weeks.    BMI 35: Now that she is feeling better overall plans to start increasing her exercise as tolerated. Discussed heart healthy diet. Is hopeful to start cardiac rehab after her trip.   Disposition: Follow up with Eula Listen, PA in three months.      Rip Harbour, NP 11/02/2022, 9:11 AM

## 2022-11-02 ENCOUNTER — Ambulatory Visit: Payer: 59 | Attending: Physician Assistant | Admitting: Cardiology

## 2022-11-02 ENCOUNTER — Encounter: Payer: Self-pay | Admitting: Physician Assistant

## 2022-11-02 VITALS — BP 128/82 | HR 69 | Ht 70.0 in | Wt 250.2 lb

## 2022-11-02 DIAGNOSIS — E782 Mixed hyperlipidemia: Secondary | ICD-10-CM

## 2022-11-02 DIAGNOSIS — Z6835 Body mass index (BMI) 35.0-35.9, adult: Secondary | ICD-10-CM

## 2022-11-02 DIAGNOSIS — I2510 Atherosclerotic heart disease of native coronary artery without angina pectoris: Secondary | ICD-10-CM

## 2022-11-02 DIAGNOSIS — Z9861 Coronary angioplasty status: Secondary | ICD-10-CM | POA: Diagnosis not present

## 2022-11-02 MED ORDER — NITROGLYCERIN 0.4 MG SL SUBL: 0.4000 mg | SUBLINGUAL_TABLET | SUBLINGUAL | 3 refills | Status: AC | PRN

## 2022-11-02 MED ORDER — PANTOPRAZOLE SODIUM 40 MG PO TBEC: 40.0000 mg | DELAYED_RELEASE_TABLET | Freq: Every day | ORAL | 1 refills | Status: AC

## 2022-11-02 MED ORDER — CLOPIDOGREL BISULFATE 75 MG PO TABS: 75.0000 mg | ORAL_TABLET | Freq: Every day | ORAL | 3 refills | Status: DC

## 2022-11-02 NOTE — Patient Instructions (Signed)
Medication Instructions:  Your Physician recommend you continue on your current medication as directed.    *If you need a refill on your cardiac medications before your next appointment, please call your pharmacy*   Lab Work: None ordered today.  If you have labs (blood work) drawn today and your tests are completely normal, you will receive your results only by: MyChart Message (if you have MyChart) OR A paper copy in the mail If you have any lab test that is abnormal or we need to change your treatment, we will call you to review the results.   Testing/Procedures: None ordered today.   Follow-Up: At [facility], you and your health needs are our priority.  As part of our continuing mission to provide you with exceptional heart care, we have created designated Provider Care Teams.  These Care Teams include your primary Cardiologist (physician) and Advanced Practice Providers (APPs -  Physician Assistants and Nurse Practitioners) who all work together to provide you with the care you need, when you need it.  We recommend signing up for the patient portal called "MyChart".  Sign up information is provided on this After Visit Summary.  MyChart is used to connect with patients for Virtual Visits (Telemedicine).  Patients are able to view lab/test results, encounter notes, upcoming appointments, etc.  Non-urgent messages can be sent to your provider as well.   To learn more about what you can do with MyChart, go to [email or web address].    Your next appointment:   3 month(s)  Provider:   You will see one of the following Advanced Practice Providers on your designated Care Team:    Eula Listen, [place]

## 2022-11-12 ENCOUNTER — Institutional Professional Consult (permissible substitution): Payer: 59 | Admitting: Student in an Organized Health Care Education/Training Program

## 2022-11-20 MED ORDER — ROSUVASTATIN CALCIUM 10 MG PO TABS: 10.0000 mg | ORAL_TABLET | Freq: Every day | ORAL | 3 refills | Status: DC

## 2022-11-20 NOTE — Addendum Note (Signed)
Addended by: Darene Lamer T on: 11/20/2022 10:41 AM   Modules accepted: Orders

## 2022-11-20 NOTE — Telephone Encounter (Signed)
See messaging below, had recommended starting on Rosuvastatin 10mg  daily. Appears as though we had been waiting for patient to confirm pharmacy. She would like this called into [place]. Please resend prescription.

## 2022-11-26 ENCOUNTER — Encounter: Payer: Self-pay | Admitting: *Deleted

## 2022-11-26 ENCOUNTER — Encounter: Payer: 59 | Attending: Cardiology | Admitting: *Deleted

## 2022-11-26 DIAGNOSIS — Z955 Presence of coronary angioplasty implant and graft: Secondary | ICD-10-CM

## 2022-11-26 NOTE — Progress Notes (Signed)
Initial phone call completed. Dx can be found in [facility] 6/21. EP orientation scheduled for 8/12 at 9:00 am.

## 2022-11-29 DIAGNOSIS — Z419 Encounter for procedure for purposes other than remedying health state, unspecified: Secondary | ICD-10-CM | POA: Diagnosis not present

## 2022-12-06 DIAGNOSIS — R0989 Other specified symptoms and signs involving the circulatory and respiratory systems: Secondary | ICD-10-CM | POA: Diagnosis not present

## 2022-12-06 DIAGNOSIS — R0602 Shortness of breath: Secondary | ICD-10-CM | POA: Diagnosis not present

## 2022-12-06 DIAGNOSIS — H900 Conductive hearing loss, bilateral: Secondary | ICD-10-CM | POA: Diagnosis not present

## 2022-12-10 ENCOUNTER — Encounter: Payer: 59 | Attending: Cardiology

## 2022-12-10 VITALS — Ht 71.0 in | Wt 248.6 lb

## 2022-12-10 DIAGNOSIS — Z955 Presence of coronary angioplasty implant and graft: Secondary | ICD-10-CM | POA: Insufficient documentation

## 2022-12-10 NOTE — Patient Instructions (Signed)
Patient Instructions  Patient Details  Name: Cassandra Robbins MRN: [id number] Date of Birth: 1971/02/09 Referring Provider:  Marykay Lex, MD  Below are your personal goals for exercise, nutrition, and risk factors. Our goal is to help you stay on track towards obtaining and maintaining these goals. We will be discussing your progress on these goals with you throughout the program.  Initial Exercise Prescription:  Initial Exercise Prescription - 12/10/22 1500       Date of Initial Exercise RX and Referring Provider   Date 12/10/22    Referring Provider Dr. [name], MD      Oxygen   Maintain Oxygen Saturation 88% or higher      Treadmill   MPH 2.6    Grade 0.5    Minutes 15    METs 3.17      NuStep   Level 3   T6 Nustep   SPM 80    Minutes 15    METs 4.49      REL-XR   Level 4    Speed 50    Minutes 15    METs 4.49      Prescription Details   Frequency (times per week) 2    Duration Progress to 30 minutes of continuous aerobic without signs/symptoms of physical distress      Intensity   THRR 40-80% of Max Heartrate 119-151    Ratings of Perceived Exertion 11-13    Perceived Dyspnea 0-4      Progression   Progression Continue to progress workloads to maintain intensity without signs/symptoms of physical distress.      Resistance Training   Training Prescription Yes    Weight 5 lb    Reps 10-15             Exercise Goals: Frequency: Be able to perform aerobic exercise two to three times per week in program working toward 2-5 days per week of home exercise.  Intensity: Work with a perceived exertion of 11 (fairly light) - 15 (hard) while following your exercise prescription.  We will make changes to your prescription with you as you progress through the program.   Duration: Be able to do 30 to 45 minutes of continuous aerobic exercise in addition to a 5 minute warm-up and a 5 minute cool-down routine.   Nutrition Goals: Your personal nutrition  goals will be established when you do your nutrition analysis with the dietician.  The following are general nutrition guidelines to follow: Cholesterol < 200mg /day Sodium < 1500mg /day Fiber: Women over 50 yrs - 21 grams per day  Personal Goals:  Personal Goals and Risk Factors at Admission - 12/10/22 1609       Core Components/Risk Factors/Patient Goals on Admission    Weight Management Yes    Intervention Weight Management: Develop a combined nutrition and exercise program designed to reach desired caloric intake, while maintaining appropriate intake of nutrient and fiber, sodium and fats, and appropriate energy expenditure required for the weight goal.;Weight Management: Provide education and appropriate resources to help participant work on and attain dietary goals.;Weight Management/Obesity: Establish reasonable short term and long term weight goals.    Admit Weight 248 lb 9.6 oz (112.8 kg)    Goal Weight: Short Term 245 lb (111.1 kg)    Goal Weight: Long Term 240 lb (108.9 kg)    Expected Outcomes Short Term: Continue to assess and modify interventions until short term weight is achieved;Long Term: Adherence to nutrition and physical activity/exercise program aimed  toward attainment of established weight goal;Weight Loss: Understanding of general recommendations for a balanced deficit meal plan, which promotes 1-2 lb weight loss per week and includes a negative energy balance of [phone] kcal/d;Weight Maintenance: Understanding of the daily nutrition guidelines, which includes 25-35% calories from fat, 7% or less cal from saturated fats, less than 200mg  cholesterol, less than 1.5gm of sodium, & 5 or more servings of fruits and vegetables daily;Understanding recommendations for meals to include 15-35% energy as protein, 25-35% energy from fat, 35-60% energy from carbohydrates, less than 200mg  of dietary cholesterol, 20-35 gm of total fiber daily;Understanding of distribution of calorie intake  throughout the day with the consumption of 4-5 meals/snacks    Lipids Yes    Intervention Provide education and support for participant on nutrition & aerobic/resistive exercise along with prescribed medications to achieve LDL 70mg , HDL >40mg .    Expected Outcomes Short Term: Participant states understanding of desired cholesterol values and is compliant with medications prescribed. Participant is following exercise prescription and nutrition guidelines.;Long Term: Cholesterol controlled with medications as prescribed, with individualized exercise RX and with personalized nutrition plan. Value goals: LDL < 70mg , HDL > 40 mg.             Exercise Goals and Review:  Exercise Goals     Row Name 12/10/22 1532             Exercise Goals   Increase Physical Activity Yes       Intervention Provide advice, education, support and counseling about physical activity/exercise needs.;Develop an individualized exercise prescription for aerobic and resistive training based on initial evaluation findings, risk stratification, comorbidities and participant's personal goals.       Expected Outcomes Short Term: Attend rehab on a regular basis to increase amount of physical activity.;Long Term: Add in home exercise to make exercise part of routine and to increase amount of physical activity.;Long Term: Exercising regularly at least 3-5 days a week.       Increase Strength and Stamina Yes       Intervention Provide advice, education, support and counseling about physical activity/exercise needs.;Develop an individualized exercise prescription for aerobic and resistive training based on initial evaluation findings, risk stratification, comorbidities and participant's personal goals.       Expected Outcomes Short Term: Increase workloads from initial exercise prescription for resistance, speed, and METs.;Short Term: Perform resistance training exercises routinely during rehab and add in resistance training at  home;Long Term: Improve cardiorespiratory fitness, muscular endurance and strength as measured by increased METs and functional capacity ( )       Able to understand and use rate of perceived exertion (RPE) scale Yes       Intervention Provide education and explanation on how to use RPE scale       Expected Outcomes Short Term: Able to use RPE daily in rehab to express subjective intensity level;Long Term:  Able to use RPE to guide intensity level when exercising independently       Able to understand and use Dyspnea scale Yes       Intervention Provide education and explanation on how to use Dyspnea scale       Expected Outcomes Short Term: Able to use Dyspnea scale daily in rehab to express subjective sense of shortness of breath during exertion;Long Term: Able to use Dyspnea scale to guide intensity level when exercising independently       Knowledge and understanding of Target Heart Rate Range (THRR) Yes  Intervention Provide education and explanation of THRR including how the numbers were predicted and where they are located for reference       Expected Outcomes Short Term: Able to state/look up THRR;Long Term: Able to use THRR to govern intensity when exercising independently;Short Term: Able to use daily as guideline for intensity in rehab       Able to check pulse independently Yes       Intervention Provide education and demonstration on how to check pulse in carotid and radial arteries.;Review the importance of being able to check your own pulse for safety during independent exercise       Expected Outcomes Short Term: Able to explain why pulse checking is important during independent exercise;Long Term: Able to check pulse independently and accurately       Understanding of Exercise Prescription Yes       Intervention Provide education, explanation, and written materials on patient's individual exercise prescription       Expected Outcomes Short Term: Able to explain program  exercise prescription;Long Term: Able to explain home exercise prescription to exercise independently

## 2022-12-10 NOTE — Progress Notes (Signed)
Assessment start time: 10:14 AM  Comorbidities prediabetes A1C 6.1%, HLD  Digestive issues/concerns: gluten sensitive, inflammatory foods.     24-hours Recall: B: greek yogurt, stevia, mango L: grilled chicken, green beans, squash, cucumber salad D: nothing (wasn't feeling good)  Beverages water (90-128oz) Alcohol 1-2 glasses per week Caffeine: coffee  Intake Patterns: Tries not to forget eat, but can happen. Not often hungry in the mornings but then finds herself very hungry in the evenings.  Pt eats away from home 3-4 times a week (mostly sit down restaurants, tries to limit fast food, chick fila is okay).     Education r/t nutrition plan Patient drinking 90-120oz of water. Cup of coffee daily. She tries to eat 3 times per day. Before her stent she was often missing breakfast and then overeating at lunch/dinner. Says that now she has to take medication with food in the morning she is better with eating 3 meals per day. Set goal to continue to work on consistency and make these changes more permanent. Talked about choosing complex carbs and pairing wit protein to help stay full and stabilize blood sugars. 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 Tries to avoid it. Built out several meals and snacks with non-inflammatory foods. Encouraged plant based protein like beans, lentils, edemame, chickpeas, ect. These would be good non gluten based complex carbs. She likes fruit as well. Set goal to make sure she is eating a carb at each meal. Patient showed a lot of skill at identify shortcomings in her diet, spoke about setting goal to put into action changes like mindful slow eating to control portions.   Goal 1: Eat a complex carb with every meal Goal 2: Be consistent with breakfast Goal 3: Continue to watch portions and eat slowly  End time 11:07 AM

## 2022-12-10 NOTE — Progress Notes (Signed)
Cardiac Individual Treatment Plan  Patient Details  Name: Cassandra Robbins MRN: [id number] Date of Birth: Mar 02, 1971 Referring Provider:   Flowsheet Row Cardiac Rehab from 12/10/2022 in [facility] Cardiac and Pulmonary Rehab  Referring Provider Dr. [name], MD       Initial Encounter Date:  Flowsheet Row Cardiac Rehab from 12/10/2022 in [facility] Cardiac and Pulmonary Rehab  Date 12/10/22       Visit Diagnosis: Status post coronary artery stent placement  Patient's Home Medications on Admission:  Current Outpatient Medications:    acetaminophen (TYLENOL) 500 MG tablet, Take 1,000 mg by mouth every 8 (eight) hours as needed for moderate pain., Disp: , Rfl:    amphetamine-dextroamphetamine (ADDERALL) 20 MG tablet, Take 1 tablet (20 mg total) by mouth 2 (two) times daily., Disp: 60 tablet, Rfl: 0   aspirin EC 81 MG tablet, Take 1 tablet (81 mg total) by mouth daily. Swallow whole., Disp: 90 tablet, Rfl: 3   budesonide-formoterol (SYMBICORT) 160-4.5 MCG/ACT inhaler, Inhale 2 puffs into the lungs 2 (two) times daily. (Patient taking differently: Inhale 2 puffs into the lungs 2 (two) times daily as needed (asthma).), Disp: 1 each, Rfl: 3   cetirizine (ZYRTEC) 10 MG tablet, Take 10 mg by mouth daily., Disp: , Rfl:    clopidogrel (PLAVIX) 75 MG tablet, Take 1 tablet (75 mg total) by mouth daily with breakfast., Disp: 90 tablet, Rfl: 3   fluticasone (FLONASE) 50 MCG/ACT nasal spray, Place 2 sprays into both nostrils daily., Disp: 16 g, Rfl: 6   ibuprofen (ADVIL) 200 MG tablet, Take 600 mg by mouth every 8 (eight) hours as needed for moderate pain., Disp: , Rfl:    ketoconazole (NIZORAL) 2 % cream, Apply 1 Application topically daily. (Patient taking differently: Apply 1 Application topically daily as needed for irritation.), Disp: 30 g, Rfl: 0   nitroGLYCERIN (NITROSTAT) 0.4 MG SL tablet, Place 1 tablet (0.4 mg total) under the tongue every 5 (five) minutes as needed for chest pain., Disp: 25 tablet,  Rfl: 3   pantoprazole (PROTONIX) 40 MG tablet, Take 1 tablet (40 mg total) by mouth daily., Disp: 90 tablet, Rfl: 1   rosuvastatin (CRESTOR) 10 MG tablet, Take 1 tablet (10 mg total) by mouth daily., Disp: 90 tablet, Rfl: 3   sodium chloride (OCEAN) 0.65 % SOLN nasal spray, Place 1 spray into both nostrils as needed for congestion., Disp: , Rfl:   Past Medical History: 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HCC)    Toe fracture, right 10/08/2017    Tobacco Use: Social History   Tobacco Use  Smoking Status Never  Smokeless Tobacco Never    Labs: Review Flowsheet  More data exists      Latest Ref Rng & Units 04/15/2018 11/30/2020 01/26/2021 01/08/2022 10/12/2022  Labs for ITP Cardiac and Pulmonary Rehab  Cholestrol 0 - 200 mg/dL 409  - 811  914  782   LDL (calc) 0 - 99 mg/dL 956  - 213  086  578   Direct LDL 0 - 99 mg/dL - - - - 469   HDL-C >62 mg/dL 58  - 54  57  59   Trlycerides <150 mg/dL 952  - 841  324  99   Hemoglobin A1c 4.8 - 5.6 % -  5.9  - 6.1  -    Details             Exercise Target Goals: Exercise Program Goal: Individual exercise prescription set using results from initial 6 min walk test and THRR while considering  patient's activity barriers and safety.   Exercise Prescription Goal: Initial exercise prescription builds to 30-45 minutes a day of aerobic activity, 2-3 days per week.  Home exercise guidelines will be given to patient during program as part of exercise prescription that the participant will acknowledge.   Education: Aerobic Exercise: - Group verbal and visual presentation on the components of exercise prescription. Introduces F.I.T.T principle from ACSM for exercise prescriptions.  Reviews F.I.T.T. principles of aerobic exercise including progression. Written  material given at graduation. Flowsheet Row Cardiac Rehab from 12/10/2022 in [facility] Cardiac and Pulmonary Rehab  Education need identified 12/10/22       Education: Resistance Exercise: - Group verbal and visual presentation on the components of exercise prescription. Introduces F.I.T.T principle from ACSM for exercise prescriptions  Reviews F.I.T.T. principles of resistance exercise including progression. Written material given at graduation.    Education: Exercise & Equipment Safety: - Individual verbal instruction and demonstration of equipment use and safety with use of the equipment. Flowsheet Row Cardiac Rehab from 12/10/2022 in [facility] Cardiac and Pulmonary Rehab  Date 12/10/22  Educator NT  Instruction Review Code 1- Verbalizes Understanding       Education: Exercise Physiology & General Exercise Guidelines: - Group verbal and written instruction with models to review the exercise physiology of the cardiovascular system and associated critical values. Provides general exercise guidelines with specific guidelines to those with heart or lung disease.    Education: Flexibility, Balance, Mind/Body Relaxation: - Group verbal and visual presentation with interactive activity on the components of exercise prescription. Introduces F.I.T.T principle from ACSM for exercise prescriptions. Reviews F.I.T.T. principles of flexibility and balance exercise training including progression. Also discusses the mind body connection.  Reviews various relaxation techniques to help reduce and manage stress (i.e. Deep breathing, progressive muscle relaxation, and visualization). Balance handout provided to take home. Written material given at graduation.   Activity Barriers & Risk Stratification:  Activity Barriers & Cardiac Risk Stratification - 12/10/22 1531       Activity Barriers & Cardiac Risk Stratification   Activity Barriers Arthritis    Cardiac Risk Stratification High             6  Minute Walk:  6 Minute Walk     Row Name 12/10/22 1529         6 Minute Walk   Phase Initial     Distance 1600 feet     Walk Time 6 minutes     # of Rest Breaks 0     MPH 3.03     METS 4.49     RPE 13     Perceived Dyspnea  1     VO2 Peak 15.71     Symptoms No     Resting HR 88 bpm     Resting BP 134/74     Resting Oxygen Saturation  100 %     Exercise Oxygen Saturation  during 6 min walk 96 %     Max Ex. HR 123 bpm     Max Ex. BP 166/72     2 Minute Post BP 148/72              Oxygen Initial Assessment:   Oxygen Re-Evaluation:  Oxygen Discharge (Final Oxygen Re-Evaluation):   Initial Exercise Prescription:  Initial Exercise Prescription - 12/10/22 1500       Date of Initial Exercise RX and Referring Provider   Date 12/10/22    Referring Provider Dr. [name], MD      Oxygen   Maintain Oxygen Saturation 88% or higher      Treadmill   MPH 2.6    Grade 0.5    Minutes 15    METs 3.17      NuStep   Level 3   T6 Nustep   SPM 80    Minutes 15    METs 4.49      REL-XR   Level 4    Speed 50    Minutes 15    METs 4.49      Prescription Details   Frequency (times per week) 2    Duration Progress to 30 minutes of continuous aerobic without signs/symptoms of physical distress      Intensity   THRR 40-80% of Max Heartrate 119-151    Ratings of Perceived Exertion 11-13    Perceived Dyspnea 0-4      Progression   Progression Continue to progress workloads to maintain intensity without signs/symptoms of physical distress.      Resistance Training   Training Prescription Yes    Weight 5 lb    Reps 10-15             Perform Capillary Blood Glucose checks as needed.  Exercise Prescription Changes:   Exercise Prescription Changes     Row Name 12/10/22 1500             Response to Exercise   Blood Pressure (Admit) 134/74       Blood Pressure (Exercise) 166/72       Blood Pressure (Exit) 148/72       Heart Rate (Admit) 88  bpm       Heart Rate (Exercise) 128 bpm       Heart Rate (Exit) 95 bpm       Oxygen Saturation (Admit) 100 %       Oxygen Saturation (Exercise) 96 %       Rating of Perceived Exertion (Exercise) 13       Perceived Dyspnea (Exercise) 1       Symptoms None       Comments Results                Exercise Comments:   Exercise Goals and Review:   Exercise Goals     Row Name 12/10/22 1532             Exercise Goals   Increase Physical Activity Yes       Intervention Provide advice, education, support and counseling about physical activity/exercise needs.;Develop an individualized exercise prescription for aerobic and resistive training based on initial evaluation findings, risk stratification, comorbidities and participant's personal goals.       Expected Outcomes Short Term: Attend rehab on a regular basis to increase amount of physical activity.;Long Term: Add in home exercise to make exercise part of routine and to increase amount of physical activity.;Long Term: Exercising regularly at least 3-5 days a week.       Increase Strength and Stamina Yes       Intervention Provide advice, education, support and counseling about physical activity/exercise needs.;Develop an individualized exercise prescription for aerobic and resistive training based on initial evaluation findings, risk stratification, comorbidities and participant's personal  goals.       Expected Outcomes Short Term: Increase workloads from initial exercise prescription for resistance, speed, and METs.;Short Term: Perform resistance training exercises routinely during rehab and add in resistance training at home;Long Term: Improve cardiorespiratory fitness, muscular endurance and strength as measured by increased METs and functional capacity ( )       Able to understand and use rate of perceived exertion (RPE) scale Yes       Intervention Provide education and explanation on how to use RPE scale       Expected  Outcomes Short Term: Able to use RPE daily in rehab to express subjective intensity level;Long Term:  Able to use RPE to guide intensity level when exercising independently       Able to understand and use Dyspnea scale Yes       Intervention Provide education and explanation on how to use Dyspnea scale       Expected Outcomes Short Term: Able to use Dyspnea scale daily in rehab to express subjective sense of shortness of breath during exertion;Long Term: Able to use Dyspnea scale to guide intensity level when exercising independently       Knowledge and understanding of Target Heart Rate Range (THRR) Yes       Intervention Provide education and explanation of THRR including how the numbers were predicted and where they are located for reference       Expected Outcomes Short Term: Able to state/look up THRR;Long Term: Able to use THRR to govern intensity when exercising independently;Short Term: Able to use daily as guideline for intensity in rehab       Able to check pulse independently Yes       Intervention Provide education and demonstration on how to check pulse in carotid and radial arteries.;Review the importance of being able to check your own pulse for safety during independent exercise       Expected Outcomes Short Term: Able to explain why pulse checking is important during independent exercise;Long Term: Able to check pulse independently and accurately       Understanding of Exercise Prescription Yes       Intervention Provide education, explanation, and written materials on patient's individual exercise prescription       Expected Outcomes Short Term: Able to explain program exercise prescription;Long Term: Able to explain home exercise prescription to exercise independently                Exercise Goals Re-Evaluation :   Discharge Exercise Prescription (Final Exercise Prescription Changes):  Exercise Prescription Changes - 12/10/22 1500       Response to Exercise   Blood  Pressure (Admit) 134/74    Blood Pressure (Exercise) 166/72    Blood Pressure (Exit) 148/72    Heart Rate (Admit) 88 bpm    Heart Rate (Exercise) 128 bpm    Heart Rate (Exit) 95 bpm    Oxygen Saturation (Admit) 100 %    Oxygen Saturation (Exercise) 96 %    Rating of Perceived Exertion (Exercise) 13    Perceived Dyspnea (Exercise) 1    Symptoms None    Comments Results             Nutrition:  Target Goals: Understanding of nutrition guidelines, daily intake of sodium 1500mg , cholesterol 200mg , calories 30% from fat and 7% or less from saturated fats, daily to have 5 or more servings of fruits and vegetables.  Education: All About Nutrition: -Group instruction provided by verbal, written  material, interactive activities, discussions, models, and posters to present general guidelines for heart healthy nutrition including fat, fiber, MyPlate, the role of sodium in heart healthy nutrition, utilization of the nutrition label, and utilization of this knowledge for meal planning. Follow up email sent as well. Written material given at graduation.   Biometrics:  Pre Biometrics - 12/10/22 1533       Pre Biometrics   Height 5\' 11"  (1.803 m)    Weight 248 lb 9.6 oz (112.8 kg)    Waist Circumference 38.5 inches    Hip Circumference 49 inches    Waist to Hip Ratio 0.79 %    BMI (Calculated) 34.69    Single Leg Stand 30 seconds              Nutrition Therapy Plan and Nutrition Goals:  Nutrition Therapy & Goals - 12/10/22 1507       Nutrition Therapy   Diet Cardiac, low na    Protein (specify units) 90    Fiber 25 grams    Whole Grain Foods 3 servings    Saturated Fats 15 max. grams    Fruits and Vegetables 5 servings/day    Sodium 2 grams      Personal Nutrition Goals   Nutrition Goal Eat a complex carb with every meal    Personal Goal #2 Be consistent with breakfast    Personal Goal #3 Continue to watch portions and eat slowly    Comments Patient drinking  90-120oz of water. Cup of coffee daily. She tries to eat 3 times per day. Before her stent she was often missing breakfast and then overeating at lunch/dinner. Says that now she has to take medication with food in the morning she is better with eating 3 meals per day. Set goal to continue to work on consistency and make these changes more permanent. Talked about choosing complex carbs and pairing wit protein to help stay full and stabilize blood sugars. 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 Tries to avoid it. Built out several meals and snacks with non-inflammatory foods. Encouraged plant based protein like beans, lentils, edemame, chickpeas, ect. These would be good non gluten based complex carbs. She likes fruit as well. Set goal to make sure she is eating a carb at each meal. Patient showed a lot of skill at identify shortcomings in her diet, spoke about setting goal to put into action changes like mindful slow eating to control portions.      Intervention Plan   Intervention Prescribe, educate and counsel regarding individualized specific dietary modifications aiming towards targeted core components such as weight, hypertension, lipid management, diabetes, heart failure and other comorbidities.;Nutrition handout(s) given to patient.    Expected Outcomes Short Term Goal: Understand basic principles of dietary content, such as calories, fat, sodium, cholesterol and nutrients.;Short Term Goal: A plan has been developed with personal nutrition goals set during dietitian appointment.;Long Term Goal: Adherence to prescribed nutrition plan.             Nutrition Assessments:  MEDIFICTS Score Key: ?70 Need to make dietary changes  40-70 Heart Healthy Diet ? 40 Therapeutic Level Cholesterol Diet  Flowsheet Row Cardiac Rehab from 12/10/2022 in [facility] Cardiac and Pulmonary Rehab  Picture Your Plate Total Score on Admission 56      Picture Your Plate Scores: <78 Unhealthy dietary  pattern with much room for improvement. 41-50 Dietary pattern unlikely to meet recommendations for good health and room for improvement. 51-60 More healthful  dietary pattern, with some room for improvement.  >60 Healthy dietary pattern, although there may be some specific behaviors that could be improved.    Nutrition Goals Re-Evaluation:   Nutrition Goals Discharge (Final Nutrition Goals Re-Evaluation):   Psychosocial: Target Goals: Acknowledge presence or absence of significant depression and/or stress, maximize coping skills, provide positive support system. Participant is able to verbalize types and ability to use techniques and skills needed for reducing stress and depression.   Education: Stress, Anxiety, and Depression - Group verbal and visual presentation to define topics covered.  Reviews how body is impacted by stress, anxiety, and depression.  Also discusses healthy ways to reduce stress and to treat/manage anxiety and depression.  Written material given at graduation.   Education: Sleep Hygiene -Provides group verbal and written instruction about how sleep can affect your health.  Define sleep hygiene, discuss sleep cycles and impact of sleep habits. Review good sleep hygiene tips.    Initial Review & Psychosocial Screening:  Initial Psych Review & Screening - 11/26/22 1614       Initial Review   Current issues with Current Depression;History of Depression      Family Dynamics   Good Support System? Yes   Lives with life partner of 12 yrs; great support per pt     Screening Interventions   Expected Outcomes Short Term goal: Utilizing psychosocial counselor, staff and physician to assist with identification of specific Stressors or current issues interfering with healing process. Setting desired goal for each stressor or current issue identified.;Long Term Goal: Stressors or current issues are controlled or eliminated.;Short Term goal: Identification and review with  participant of any Quality of Life or Depression concerns found by scoring the questionnaire.;Long Term goal: The participant improves quality of Life and PHQ9 Scores as seen by post scores and/or verbalization of changes             Quality of Life Scores:   Quality of Life - 12/10/22 1556       Quality of Life   Select Quality of Life      Quality of Life Scores   Health/Function Pre 26.37 %    Socioeconomic Pre 26.25 %    Psych/Spiritual Pre 25.71 %    Family Pre 26.3 %    GLOBAL Pre 26.2 %            Scores of 19 and below usually indicate a poorer quality of life in these areas.  A difference of  2-3 points is a clinically meaningful difference.  A difference of 2-3 points in the total score of the Quality of Life Index has been associated with significant improvement in overall quality of life, self-image, physical symptoms, and general health in studies assessing change in quality of life.  PHQ-9: Review Flowsheet  More data exists      12/10/2022 10/02/2022 08/06/2022 07/09/2022 06/21/2022  Depression screen PHQ 2/9  Decreased Interest 0 1 1 1 1   Down, Depressed, Hopeless 0 1 1 1 1   PHQ - 2 Score 0 2 2 2 2   Altered sleeping 0 0 2 1 1   Tired, decreased energy 0 1 2 3 3   Change in appetite 0 0 2 2 1   Feeling bad or failure about yourself  0 0 1 1 0  Trouble concentrating 0 0 1 1 0  Moving slowly or fidgety/restless 0 0 1 1 1   Suicidal thoughts 0 0 1 0 0  PHQ-9 Score 0 3 12 11  8  Difficult doing work/chores - Not difficult at all Somewhat difficult Somewhat difficult -    Details           Interpretation of Total Score  Total Score Depression Severity:  1-4 = Minimal depression, 5-9 = Mild depression, 10-14 = Moderate depression, 15-19 = Moderately severe depression, 20-27 = Severe depression   Psychosocial Evaluation and Intervention:  Psychosocial Evaluation - 11/26/22 1617       Psychosocial Evaluation & Interventions   Interventions Encouraged to  exercise with the program and follow exercise prescription;Relaxation education;Stress management education    Comments Ashyla is coming to cardiac rehab post cardiac stent. She is feeling back to baseline. 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 Kiylee has a great support system at home. She has chronic pain secondary to autoimmune disorder, however, she says this should not interfere with exercise. She does have a h/o depression and discussed her managament techniques as she has dealt with for years. She is looking for accountability by coming to rehab. Luciann has no barriers to attending the program and is ready to begin rehab.    Expected Outcomes Short: Attend cardiac rehab for education and exercise. Long: Develop and maintain positive self care habits.    Continue Psychosocial Services  Follow up required by staff             Psychosocial Re-Evaluation:   Psychosocial Discharge (Final Psychosocial Re-Evaluation):   Vocational Rehabilitation: Provide vocational rehab assistance to qualifying candidates.   Vocational Rehab Evaluation & Intervention:   Education: Education Goals: Education classes will be provided on a variety of topics geared toward better understanding of heart health and risk factor modification. Participant will state understanding/return demonstration of topics presented as noted by education test scores.  Learning Barriers/Preferences:  Learning Barriers/Preferences - 11/26/22 1616       Learning Barriers/Preferences   Learning Barriers None    Learning Preferences None             General Cardiac Education Topics:  AED/CPR: - Group verbal and written instruction with the use of models to demonstrate the basic use of the AED with the basic ABC's of resuscitation.   Anatomy and Cardiac Procedures: - Group verbal and visual presentation and models provide information about basic cardiac anatomy and function.  Reviews the testing methods done to diagnose heart disease and the outcomes of the test results. Describes the treatment choices: Medical Management, Angioplasty, or Coronary Bypass Surgery for treating various heart conditions including Myocardial Infarction, Angina, Valve Disease, and Cardiac Arrhythmias.  Written material given at graduation.   Medication Safety: - Group verbal and visual instruction to review commonly prescribed medications for heart and lung disease. Reviews the medication, class of the drug, and side effects. Includes the steps to properly store meds and maintain the prescription regimen.  Written material given at graduation.   Intimacy: - Group verbal instruction through game format to discuss how heart and lung disease can affect sexual intimacy. Written material given at graduation..   Know Your Numbers and Heart Failure: - Group verbal and visual instruction to discuss disease risk factors for cardiac and pulmonary disease and treatment options.  Reviews associated critical values for Overweight/Obesity, Hypertension, Cholesterol, and Diabetes.  Discusses basics of heart failure: signs/symptoms and treatments.  Introduces Heart Failure Zone chart for action plan for heart failure.  Written material given at graduation.   Infection Prevention: - Provides verbal and written material to individual with  discussion of infection control including proper hand washing and proper equipment cleaning during exercise session. Flowsheet Row Cardiac Rehab from 12/10/2022 in [facility] Cardiac and Pulmonary Rehab  Date 12/10/22  Educator NT  Instruction Review Code 1- Verbalizes Understanding       Falls Prevention: - Provides verbal and written material to individual with discussion of falls prevention and safety. Flowsheet Row Cardiac Rehab from 12/10/2022 in [facility] Cardiac and Pulmonary Rehab  Date 12/10/22  Educator NT  Instruction Review Code 1- Verbalizes Understanding        Other: -Provides group and verbal instruction on various topics (see comments)   Knowledge Questionnaire Score:  Knowledge Questionnaire Score - 12/10/22 1556       Knowledge Questionnaire Score   Pre Score 25/26             Core Components/Risk Factors/Patient Goals at Admission:  Personal Goals and Risk Factors at Admission - 12/10/22 1609       Core Components/Risk Factors/Patient Goals on Admission    Weight Management Yes    Intervention Weight Management: Develop a combined nutrition and exercise program designed to reach desired caloric intake, while maintaining appropriate intake of nutrient and fiber, sodium and fats, and appropriate energy expenditure required for the weight goal.;Weight Management: Provide education and appropriate resources to help participant work on and attain dietary goals.;Weight Management/Obesity: Establish reasonable short term and long term weight goals.    Admit Weight 248 lb 9.6 oz (112.8 kg)    Goal Weight: Short Term 245 lb (111.1 kg)    Goal Weight: Long Term 240 lb (108.9 kg)    Expected Outcomes Short Term: Continue to assess and modify interventions until short term weight is achieved;Long Term: Adherence to nutrition and physical activity/exercise program aimed toward attainment of established weight goal;Weight Loss: Understanding of general recommendations for a balanced deficit meal plan, which promotes 1-2 lb weight loss per week and includes a negative energy balance of [phone] kcal/d;Weight Maintenance: Understanding of the daily nutrition guidelines, which includes 25-35% calories from fat, 7% or less cal from saturated fats, less than 200mg  cholesterol, less than 1.5gm of sodium, & 5 or more servings of fruits and vegetables daily;Understanding recommendations for meals to include 15-35% energy as protein, 25-35% energy from fat, 35-60% energy from carbohydrates, less than 200mg  of dietary cholesterol, 20-35 gm of total fiber  daily;Understanding of distribution of calorie intake throughout the day with the consumption of 4-5 meals/snacks    Lipids Yes    Intervention Provide education and support for participant on nutrition & aerobic/resistive exercise along with prescribed medications to achieve LDL 70mg , HDL >40mg .    Expected Outcomes Short Term: Participant states understanding of desired cholesterol values and is compliant with medications prescribed. Participant is following exercise prescription and nutrition guidelines.;Long Term: Cholesterol controlled with medications as prescribed, with individualized exercise RX and with personalized nutrition plan. Value goals: LDL < 70mg , HDL > 40 mg.             Education:Diabetes - Individual verbal and written instruction to review signs/symptoms of diabetes, desired ranges of glucose level fasting, after meals and with exercise. Acknowledge that pre and post exercise glucose checks will be done for 3 sessions at entry of program.   Core Components/Risk Factors/Patient Goals Review:    Core Components/Risk Factors/Patient Goals at Discharge (Final Review):    ITP Comments:  ITP Comments     Row Name 11/26/22 1610 12/10/22 1525  ITP Comments Initial phone call completed. Dx can be found in [facility] 6/21. EP orientation scheduled for 8/12 at 9:00 am. Completed and gym orientation. Initial ITP created and sent for review to Dr. Bethann Punches, Medical Director.               Comments: Initial ITP

## 2022-12-12 ENCOUNTER — Other Ambulatory Visit: Payer: Self-pay | Admitting: Family Medicine

## 2022-12-12 DIAGNOSIS — F909 Attention-deficit hyperactivity disorder, unspecified type: Secondary | ICD-10-CM

## 2022-12-13 NOTE — Telephone Encounter (Signed)
Requested medication (s) are due for refill today - yes  Requested medication (s) are on the active medication list -yes  Future visit scheduled -no  Last refill: 07/11/22 #60  Notes to clinic: non delegated Rx  Requested Prescriptions  Pending Prescriptions Disp Refills   amphetamine-dextroamphetamine (ADDERALL) 20 MG tablet [Pharmacy Med Name: DEXTROAMP-AMPHETAMIN 20 MG TAB] 60 tablet 0    Sig: Take 1 tablet (20 mg total) by mouth 2 (two) times daily.     Not Delegated - Psychiatry:  Stimulants/ADHD Failed - 12/12/2022  1:17 PM      Failed - This refill cannot be delegated      Failed - Urine Drug Screen completed in last 360 days      Passed - Last BP in normal range    BP Readings from Last 1 Encounters:  11/02/22 128/82         Passed - Last Heart Rate in normal range    Pulse Readings from Last 1 Encounters:  11/02/22 69         Passed - Valid encounter within last 6 months    Recent Outpatient Visits           2 months ago Peripheral edema   [place] [facility] [place], Megan P, DO   4 months ago SOB (shortness of breath)   [place] [facility], Megan P, DO   5 months ago SOB (shortness of breath)   [place] [facility] [place], Megan P, DO   5 months ago Upper respiratory tract infection, unspecified type   [place] [facility] [place], Megan P, DO   8 months ago Moderate recurrent major depression (HCC)   [place] [facility] Mecum, Oswaldo Conroy, PA-C       Future Appointments             In 1 month Dunn, Raymon Mutton, PA-C [facility]               Requested Prescriptions  Pending Prescriptions Disp Refills   amphetamine-dextroamphetamine (ADDERALL) 20 MG tablet [Pharmacy Med Name: DEXTROAMP-AMPHETAMIN 20 MG TAB] 60 tablet 0    Sig: Take 1 tablet (20 mg total) by mouth 2 (two) times daily.     Not Delegated - Psychiatry:  Stimulants/ADHD Failed  - 12/12/2022  1:17 PM      Failed - This refill cannot be delegated      Failed - Urine Drug Screen completed in last 360 days      Passed - Last BP in normal range    BP Readings from Last 1 Encounters:  11/02/22 128/82         Passed - Last Heart Rate in normal range    Pulse Readings from Last 1 Encounters:  11/02/22 69         Passed - Valid encounter within last 6 months    Recent Outpatient Visits           2 months ago Peripheral edema   [place] [facility] [place], Megan P, DO   4 months ago SOB (shortness of breath)   [place] [facility], Megan P, DO   5 months ago SOB (shortness of breath)   [place] [facility], Megan P, DO   5 months ago Upper respiratory tract infection, unspecified type   [place] [facility] [place], Megan P, DO   8  months ago Moderate recurrent major depression (HCC)   [place] [facility] Mecum, Oswaldo Conroy, PA-C       Future Appointments             In 1 month Dunn, Raymon Mutton, PA-C [place] [facility]

## 2022-12-17 ENCOUNTER — Other Ambulatory Visit
Admission: RE | Admit: 2022-12-17 | Discharge: 2022-12-17 | Disposition: A | Discharge status: Home / Self Care | Payer: 59 | Source: Ambulatory Visit | Attending: Cardiology | Admitting: Cardiology

## 2022-12-17 DIAGNOSIS — E782 Mixed hyperlipidemia: Secondary | ICD-10-CM | POA: Insufficient documentation

## 2022-12-17 DIAGNOSIS — I25110 Atherosclerotic heart disease of native coronary artery with unstable angina pectoris: Secondary | ICD-10-CM | POA: Diagnosis not present

## 2022-12-17 LAB — HEPATIC FUNCTION PANEL
ALT: 21 U/L (ref 0–44)
AST: 21 U/L (ref 15–41)
Albumin: 4 g/dL (ref 3.5–5.0)
Alkaline Phosphatase: 94 U/L (ref 38–126)
Bilirubin, Direct: <0.1 mg/dL (ref 0.0–0.2)
Total Bilirubin: 0.6 mg/dL (ref 0.3–1.2)
Total Protein: 7.3 g/dL (ref 6.5–8.1)

## 2022-12-17 LAB — LIPID PANEL
Cholesterol: 145 mg/dL (ref 0–200)
HDL: 49 mg/dL (ref >40)
LDL Cholesterol: 74 mg/dL (ref 0–99)
Total CHOL/HDL Ratio: 3 RATIO
Triglycerides: 109 mg/dL (ref <150)
VLDL: 22 mg/dL (ref 0–40)

## 2022-12-17 LAB — LDL CHOLESTEROL, DIRECT: Direct LDL: 86 mg/dL (ref 0–99)

## 2022-12-18 ENCOUNTER — Telehealth: Payer: Self-pay | Admitting: Cardiology

## 2022-12-18 NOTE — Telephone Encounter (Signed)
Spoke with the patient regarding her lab results and recommendations from [facility], NP to increase crestor to 20 mg once daily. Per the patient, she stopped atorvastatin in the middle of July due to myalgias. She did not start Crestor 10 mg once daily until ~ 1 week ago. She developed nausea after 24 hours of starting Crestor and has continued with the nausea until today. She held her dose of Crestor yesterday.  The patient states she has been taking Berberine once daily and has changed her diet as well.  I have advised the patient to continue to hold crestor for now and we will forward this message to Dr. Herbie Baltimore for further recommendations regarding a statin.  The patient voices understanding and is agreeable.

## 2022-12-18 NOTE — Telephone Encounter (Signed)
Cassandra Harbour, Cassandra Robbins 12/17/2022  4:48 PM EDT     Please let Ms. Kreft know she has had significant improvement in her lipid profile. Her total cholesterol is 145 and LDL is 74, great results but her LDL goal is below 70, can we increase her rosuvastatin to 20mg  daily. Please arrange repeat fasting lipid profile and LFTs in 2 months.

## 2022-12-19 NOTE — Telephone Encounter (Signed)
Lets see if we can we start rosuvastatin at 10 mg just 2 days a week for a month to see if she tolerates that and if she does then increase 1 additional day a week every 2 weeks until unable to tolerate.  [name], MD

## 2022-12-20 ENCOUNTER — Institutional Professional Consult (permissible substitution) (HOSPITAL_BASED_OUTPATIENT_CLINIC_OR_DEPARTMENT_OTHER): Payer: 59 | Admitting: Pulmonary Disease

## 2022-12-24 ENCOUNTER — Telehealth: Payer: Self-pay | Admitting: Cardiology

## 2022-12-24 NOTE — Telephone Encounter (Signed)
Returned call to pt. No VM to leave message.

## 2022-12-24 NOTE — Telephone Encounter (Signed)
Left VM to call back regarding providers recommendations below.    Lets see if we can we start rosuvastatin at 10 mg just 2 days a week for a month to see if she tolerates that and if she does then increase 1 additional day a week every 2 weeks until unable to tolerate.   [name], MD

## 2022-12-24 NOTE — Telephone Encounter (Signed)
Follow Up:     Patientis returning a call from this morning. 

## 2022-12-26 NOTE — Telephone Encounter (Signed)
Patient advised of providers message and verbalized understanding.

## 2022-12-30 DIAGNOSIS — Z419 Encounter for procedure for purposes other than remedying health state, unspecified: Secondary | ICD-10-CM | POA: Diagnosis not present

## 2023-01-02 ENCOUNTER — Encounter: Payer: 59 | Attending: Cardiology | Admitting: *Deleted

## 2023-01-02 ENCOUNTER — Encounter: Payer: Self-pay | Admitting: *Deleted

## 2023-01-02 DIAGNOSIS — Z48812 Encounter for surgical aftercare following surgery on the circulatory system: Secondary | ICD-10-CM | POA: Insufficient documentation

## 2023-01-02 DIAGNOSIS — I2089 Other forms of angina pectoris: Secondary | ICD-10-CM | POA: Insufficient documentation

## 2023-01-02 DIAGNOSIS — Z955 Presence of coronary angioplasty implant and graft: Secondary | ICD-10-CM | POA: Diagnosis not present

## 2023-01-02 NOTE — Progress Notes (Signed)
Cardiac Individual Treatment Plan  Patient Details  Name: Cassandra Robbins MRN: [id number] Date of Birth: [birth year] Referring Provider:   Flowsheet Row Cardiac Rehab from 12/10/2022 in [facility] Cardiac and Pulmonary Rehab  Referring Provider Dr. [name], MD       Initial Encounter Date:  Flowsheet Row Cardiac Rehab from 12/10/2022 in [facility] Cardiac and Pulmonary Rehab  Date 12/10/22       Visit Diagnosis: Status post coronary artery stent placement  Patient's Home Medications on Admission:  Current Outpatient Medications:    acetaminophen (TYLENOL) 500 MG tablet, Take 1,000 mg by mouth every 8 (eight) hours as needed for moderate pain., Disp: , Rfl:    amphetamine-dextroamphetamine (ADDERALL) 20 MG tablet, Take 1 tablet (20 mg total) by mouth 2 (two) times daily., Disp: 60 tablet, Rfl: 0   aspirin EC 81 MG tablet, Take 1 tablet (81 mg total) by mouth daily. Swallow whole., Disp: 90 tablet, Rfl: 3   budesonide-formoterol (SYMBICORT) 160-4.5 MCG/ACT inhaler, Inhale 2 puffs into the lungs 2 (two) times daily. (Patient taking differently: Inhale 2 puffs into the lungs 2 (two) times daily as needed (asthma).), Disp: 1 each, Rfl: 3   cetirizine (ZYRTEC) 10 MG tablet, Take 10 mg by mouth daily., Disp: , Rfl:    clopidogrel (PLAVIX) 75 MG tablet, Take 1 tablet (75 mg total) by mouth daily with breakfast., Disp: 90 tablet, Rfl: 3   fluticasone (FLONASE) 50 MCG/ACT nasal spray, Place 2 sprays into both nostrils daily., Disp: 16 g, Rfl: 6   ibuprofen (ADVIL) 200 MG tablet, Take 600 mg by mouth every 8 (eight) hours as needed for moderate pain., Disp: , Rfl:    ketoconazole (NIZORAL) 2 % cream, Apply 1 Application topically daily. (Patient taking differently: Apply 1 Application topically daily as needed for irritation.), Disp: 30 g, Rfl: 0   nitroGLYCERIN (NITROSTAT) 0.4 MG SL tablet, Place 1 tablet (0.4 mg total) under the tongue every 5 (five) minutes as needed for chest pain., Disp: 25 tablet,  Rfl: 3   pantoprazole (PROTONIX) 40 MG tablet, Take 1 tablet (40 mg total) by mouth daily., Disp: 90 tablet, Rfl: 1   rosuvastatin (CRESTOR) 10 MG tablet, Take 1 tablet (10 mg total) by mouth daily., Disp: 90 tablet, Rfl: 3   sodium chloride (OCEAN) 0.65 % SOLN nasal spray, Place 1 spray into both nostrils as needed for congestion., Disp: , Rfl:   Past Medical History: 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HCC)    Toe fracture, right 10/08/2017    Tobacco Use: Social History   Tobacco Use  Smoking Status Never  Smokeless Tobacco Never    Labs: Review Flowsheet  More data exists      Latest Ref Rng & Units 11/30/2020 01/26/2021 01/08/2022 10/12/2022 12/17/2022  Labs for ITP Cardiac and Pulmonary Rehab  Cholestrol 0 - 200 mg/dL - 952  841  324  401   LDL (calc) 0 - 99 mg/dL - 027  253  664  74   Direct LDL 0 - 99 mg/dL - - - 403  86   HDL-C >47 mg/dL - 54  57  59  49   Trlycerides <150 mg/dL - 425  956  99  387   Hemoglobin A1c 4.8 -  5.6 % 5.9  - 6.1  - -    Details             Exercise Target Goals: Exercise Program Goal: Individual exercise prescription set using results from initial 6 min walk test and THRR while considering  patient's activity barriers and safety.   Exercise Prescription Goal: Initial exercise prescription builds to 30-45 minutes a day of aerobic activity, 2-3 days per week.  Home exercise guidelines will be given to patient during program as part of exercise prescription that the participant will acknowledge.   Education: Aerobic Exercise: - Group verbal and visual presentation on the components of exercise prescription. Introduces F.I.T.T principle from ACSM for exercise prescriptions.  Reviews F.I.T.T. principles of aerobic exercise including progression. Written  material given at graduation. Flowsheet Row Cardiac Rehab from 12/10/2022 in [facility] Cardiac and Pulmonary Rehab  Education need identified 12/10/22       Education: Resistance Exercise: - Group verbal and visual presentation on the components of exercise prescription. Introduces F.I.T.T principle from ACSM for exercise prescriptions  Reviews F.I.T.T. principles of resistance exercise including progression. Written material given at graduation.    Education: Exercise & Equipment Safety: - Individual verbal instruction and demonstration of equipment use and safety with use of the equipment. Flowsheet Row Cardiac Rehab from 12/10/2022 in [facility] Cardiac and Pulmonary Rehab  Date 12/10/22  Educator NT  Instruction Review Code 1- Verbalizes Understanding       Education: Exercise Physiology & General Exercise Guidelines: - Group verbal and written instruction with models to review the exercise physiology of the cardiovascular system and associated critical values. Provides general exercise guidelines with specific guidelines to those with heart or lung disease.    Education: Flexibility, Balance, Mind/Body Relaxation: - Group verbal and visual presentation with interactive activity on the components of exercise prescription. Introduces F.I.T.T principle from ACSM for exercise prescriptions. Reviews F.I.T.T. principles of flexibility and balance exercise training including progression. Also discusses the mind body connection.  Reviews various relaxation techniques to help reduce and manage stress (i.e. Deep breathing, progressive muscle relaxation, and visualization). Balance handout provided to take home. Written material given at graduation.   Activity Barriers & Risk Stratification:  Activity Barriers & Cardiac Risk Stratification - 12/10/22 1531       Activity Barriers & Cardiac Risk Stratification   Activity Barriers Arthritis    Cardiac Risk Stratification High             6  Minute Walk:  6 Minute Walk     Row Name 12/10/22 1529         6 Minute Walk   Phase Initial     Distance 1600 feet     Walk Time 6 minutes     # of Rest Breaks 0     MPH 3.03     METS 4.49     RPE 13     Perceived Dyspnea  1     VO2 Peak 15.71     Symptoms No     Resting HR 88 bpm     Resting BP 134/74     Resting Oxygen Saturation  100 %     Exercise Oxygen Saturation  during 6 min walk 96 %     Max Ex. HR 123 bpm     Max Ex. BP 166/72     2 Minute Post BP 148/72              Oxygen Initial Assessment:  Oxygen Re-Evaluation:   Oxygen Discharge (Final Oxygen Re-Evaluation):   Initial Exercise Prescription:  Initial Exercise Prescription - 12/10/22 1500       Date of Initial Exercise RX and Referring Provider   Date 12/10/22    Referring Provider Dr. [name], MD      Oxygen   Maintain Oxygen Saturation 88% or higher      Treadmill   MPH 2.6    Grade 0.5    Minutes 15    METs 3.17      NuStep   Level 3   T6 Nustep   SPM 80    Minutes 15    METs 4.49      REL-XR   Level 4    Speed 50    Minutes 15    METs 4.49      Prescription Details   Frequency (times per week) 2    Duration Progress to 30 minutes of continuous aerobic without signs/symptoms of physical distress      Intensity   THRR 40-80% of Max Heartrate 119-151    Ratings of Perceived Exertion 11-13    Perceived Dyspnea 0-4      Progression   Progression Continue to progress workloads to maintain intensity without signs/symptoms of physical distress.      Resistance Training   Training Prescription Yes    Weight 5 lb    Reps 10-15             Perform Capillary Blood Glucose checks as needed.  Exercise Prescription Changes:   Exercise Prescription Changes     Row Name 12/10/22 1500             Response to Exercise   Blood Pressure (Admit) 134/74       Blood Pressure (Exercise) 166/72       Blood Pressure (Exit) 148/72       Heart Rate (Admit) 88  bpm       Heart Rate (Exercise) 128 bpm       Heart Rate (Exit) 95 bpm       Oxygen Saturation (Admit) 100 %       Oxygen Saturation (Exercise) 96 %       Rating of Perceived Exertion (Exercise) 13       Perceived Dyspnea (Exercise) 1       Symptoms None       Comments Results                Exercise Comments:   Exercise Goals and Review:   Exercise Goals     Row Name 12/10/22 1532             Exercise Goals   Increase Physical Activity Yes       Intervention Provide advice, education, support and counseling about physical activity/exercise needs.;Develop an individualized exercise prescription for aerobic and resistive training based on initial evaluation findings, risk stratification, comorbidities and participant's personal goals.       Expected Outcomes Short Term: Attend rehab on a regular basis to increase amount of physical activity.;Long Term: Add in home exercise to make exercise part of routine and to increase amount of physical activity.;Long Term: Exercising regularly at least 3-5 days a week.       Increase Strength and Stamina Yes       Intervention Provide advice, education, support and counseling about physical activity/exercise needs.;Develop an individualized exercise prescription for aerobic and resistive training based on initial evaluation findings, risk stratification,  comorbidities and participant's personal goals.       Expected Outcomes Short Term: Increase workloads from initial exercise prescription for resistance, speed, and METs.;Short Term: Perform resistance training exercises routinely during rehab and add in resistance training at home;Long Term: Improve cardiorespiratory fitness, muscular endurance and strength as measured by increased METs and functional capacity ( )       Able to understand and use rate of perceived exertion (RPE) scale Yes       Intervention Provide education and explanation on how to use RPE scale       Expected  Outcomes Short Term: Able to use RPE daily in rehab to express subjective intensity level;Long Term:  Able to use RPE to guide intensity level when exercising independently       Able to understand and use Dyspnea scale Yes       Intervention Provide education and explanation on how to use Dyspnea scale       Expected Outcomes Short Term: Able to use Dyspnea scale daily in rehab to express subjective sense of shortness of breath during exertion;Long Term: Able to use Dyspnea scale to guide intensity level when exercising independently       Knowledge and understanding of Target Heart Rate Range (THRR) Yes       Intervention Provide education and explanation of THRR including how the numbers were predicted and where they are located for reference       Expected Outcomes Short Term: Able to state/look up THRR;Long Term: Able to use THRR to govern intensity when exercising independently;Short Term: Able to use daily as guideline for intensity in rehab       Able to check pulse independently Yes       Intervention Provide education and demonstration on how to check pulse in carotid and radial arteries.;Review the importance of being able to check your own pulse for safety during independent exercise       Expected Outcomes Short Term: Able to explain why pulse checking is important during independent exercise;Long Term: Able to check pulse independently and accurately       Understanding of Exercise Prescription Yes       Intervention Provide education, explanation, and written materials on patient's individual exercise prescription       Expected Outcomes Short Term: Able to explain program exercise prescription;Long Term: Able to explain home exercise prescription to exercise independently                Exercise Goals Re-Evaluation :   Discharge Exercise Prescription (Final Exercise Prescription Changes):  Exercise Prescription Changes - 12/10/22 1500       Response to Exercise   Blood  Pressure (Admit) 134/74    Blood Pressure (Exercise) 166/72    Blood Pressure (Exit) 148/72    Heart Rate (Admit) 88 bpm    Heart Rate (Exercise) 128 bpm    Heart Rate (Exit) 95 bpm    Oxygen Saturation (Admit) 100 %    Oxygen Saturation (Exercise) 96 %    Rating of Perceived Exertion (Exercise) 13    Perceived Dyspnea (Exercise) 1    Symptoms None    Comments Results             Nutrition:  Target Goals: Understanding of nutrition guidelines, daily intake of sodium 1500mg , cholesterol 200mg , calories 30% from fat and 7% or less from saturated fats, daily to have 5 or more servings of fruits and vegetables.  Education: All About Nutrition: -Group instruction  provided by verbal, written material, interactive activities, discussions, models, and posters to present general guidelines for heart healthy nutrition including fat, fiber, MyPlate, the role of sodium in heart healthy nutrition, utilization of the nutrition label, and utilization of this knowledge for meal planning. Follow up email sent as well. Written material given at graduation.   Biometrics:  Pre Biometrics - 12/10/22 1533       Pre Biometrics   Height 5\' 11"  (1.803 m)    Weight 248 lb 9.6 oz (112.8 kg)    Waist Circumference 38.5 inches    Hip Circumference 49 inches    Waist to Hip Ratio 0.79 %    BMI (Calculated) 34.69    Single Leg Stand 30 seconds              Nutrition Therapy Plan and Nutrition Goals:  Nutrition Therapy & Goals - 12/10/22 1507       Nutrition Therapy   Diet Cardiac, low na    Protein (specify units) 90    Fiber 25 grams    Whole Grain Foods 3 servings    Saturated Fats 15 max. grams    Fruits and Vegetables 5 servings/day    Sodium 2 grams      Personal Nutrition Goals   Nutrition Goal Eat a complex carb with every meal    Personal Goal #2 Be consistent with breakfast    Personal Goal #3 Continue to watch portions and eat slowly    Comments Patient drinking  90-120oz of water. Cup of coffee daily. She tries to eat 3 times per day. Before her stent she was often missing breakfast and then overeating at lunch/dinner. Says that now she has to take medication with food in the morning she is better with eating 3 meals per day. Set goal to continue to work on consistency and make these changes more permanent. Talked about choosing complex carbs and pairing wit protein to help stay full and stabilize blood sugars. 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 Tries to avoid it. Built out several meals and snacks with non-inflammatory foods. Encouraged plant based protein like beans, lentils, edemame, chickpeas, ect. These would be good non gluten based complex carbs. She likes fruit as well. Set goal to make sure she is eating a carb at each meal. Patient showed a lot of skill at identify shortcomings in her diet, spoke about setting goal to put into action changes like mindful slow eating to control portions.      Intervention Plan   Intervention Prescribe, educate and counsel regarding individualized specific dietary modifications aiming towards targeted core components such as weight, hypertension, lipid management, diabetes, heart failure and other comorbidities.;Nutrition handout(s) given to patient.    Expected Outcomes Short Term Goal: Understand basic principles of dietary content, such as calories, fat, sodium, cholesterol and nutrients.;Short Term Goal: A plan has been developed with personal nutrition goals set during dietitian appointment.;Long Term Goal: Adherence to prescribed nutrition plan.             Nutrition Assessments:  MEDIFICTS Score Key: ?70 Need to make dietary changes  40-70 Heart Healthy Diet ? 40 Therapeutic Level Cholesterol Diet  Flowsheet Row Cardiac Rehab from 12/10/2022 in [facility] Cardiac and Pulmonary Rehab  Picture Your Plate Total Score on Admission 56      Picture Your Plate Scores: <65 Unhealthy dietary  pattern with much room for improvement. 41-50 Dietary pattern unlikely to meet recommendations for good health and room for  improvement. 51-60 More healthful dietary pattern, with some room for improvement.  >60 Healthy dietary pattern, although there may be some specific behaviors that could be improved.    Nutrition Goals Re-Evaluation:   Nutrition Goals Discharge (Final Nutrition Goals Re-Evaluation):   Psychosocial: Target Goals: Acknowledge presence or absence of significant depression and/or stress, maximize coping skills, provide positive support system. Participant is able to verbalize types and ability to use techniques and skills needed for reducing stress and depression.   Education: Stress, Anxiety, and Depression - Group verbal and visual presentation to define topics covered.  Reviews how body is impacted by stress, anxiety, and depression.  Also discusses healthy ways to reduce stress and to treat/manage anxiety and depression.  Written material given at graduation.   Education: Sleep Hygiene -Provides group verbal and written instruction about how sleep can affect your health.  Define sleep hygiene, discuss sleep cycles and impact of sleep habits. Review good sleep hygiene tips.    Initial Review & Psychosocial Screening:  Initial Psych Review & Screening - 11/26/22 1614       Initial Review   Current issues with Current Depression;History of Depression      Family Dynamics   Good Support System? Yes   Lives with life partner of 12 yrs; great support per pt     Screening Interventions   Expected Outcomes Short Term goal: Utilizing psychosocial counselor, staff and physician to assist with identification of specific Stressors or current issues interfering with healing process. Setting desired goal for each stressor or current issue identified.;Long Term Goal: Stressors or current issues are controlled or eliminated.;Short Term goal: Identification and review with  participant of any Quality of Life or Depression concerns found by scoring the questionnaire.;Long Term goal: The participant improves quality of Life and PHQ9 Scores as seen by post scores and/or verbalization of changes             Quality of Life Scores:   Quality of Life - 12/10/22 1556       Quality of Life   Select Quality of Life      Quality of Life Scores   Health/Function Pre 26.37 %    Socioeconomic Pre 26.25 %    Psych/Spiritual Pre 25.71 %    Family Pre 26.3 %    GLOBAL Pre 26.2 %            Scores of 19 and below usually indicate a poorer quality of life in these areas.  A difference of  2-3 points is a clinically meaningful difference.  A difference of 2-3 points in the total score of the Quality of Life Index has been associated with significant improvement in overall quality of life, self-image, physical symptoms, and general health in studies assessing change in quality of life.  PHQ-9: Review Flowsheet  More data exists      12/10/2022 10/02/2022 08/06/2022 07/09/2022 06/21/2022  Depression screen PHQ 2/9  Decreased Interest 0 1 1 1 1   Down, Depressed, Hopeless 0 1 1 1 1   PHQ - 2 Score 0 2 2 2 2   Altered sleeping 0 0 2 1 1   Tired, decreased energy 0 1 2 3 3   Change in appetite 0 0 2 2 1   Feeling bad or failure about yourself  0 0 1 1 0  Trouble concentrating 0 0 1 1 0  Moving slowly or fidgety/restless 0 0 1 1 1   Suicidal thoughts 0 0 1 0 0  PHQ-9 Score 0 3  12 11 8   Difficult doing work/chores - Not difficult at all Somewhat difficult Somewhat difficult -    Details           Interpretation of Total Score  Total Score Depression Severity:  1-4 = Minimal depression, 5-9 = Mild depression, 10-14 = Moderate depression, 15-19 = Moderately severe depression, 20-27 = Severe depression   Psychosocial Evaluation and Intervention:  Psychosocial Evaluation - 11/26/22 1617       Psychosocial Evaluation & Interventions   Interventions Encouraged to  exercise with the program and follow exercise prescription;Relaxation education;Stress management education    Comments Cassandra Robbins is coming to cardiac rehab post cardiac stent. She is feeling back to baseline. 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 Cassandra Robbins has a great support system at home. She has chronic pain secondary to autoimmune disorder, however, she says this should not interfere with exercise. She does have a h/o depression and discussed her managament techniques as she has dealt with for years. She is looking for accountability by coming to rehab. Cassandra Robbins has no barriers to attending the program and is ready to begin rehab.    Expected Outcomes Short: Attend cardiac rehab for education and exercise. Long: Develop and maintain positive self care habits.    Continue Psychosocial Services  Follow up required by staff             Psychosocial Re-Evaluation:   Psychosocial Discharge (Final Psychosocial Re-Evaluation):   Vocational Rehabilitation: Provide vocational rehab assistance to qualifying candidates.   Vocational Rehab Evaluation & Intervention:   Education: Education Goals: Education classes will be provided on a variety of topics geared toward better understanding of heart health and risk factor modification. Participant will state understanding/return demonstration of topics presented as noted by education test scores.  Learning Barriers/Preferences:  Learning Barriers/Preferences - 11/26/22 1616       Learning Barriers/Preferences   Learning Barriers None    Learning Preferences None             General Cardiac Education Topics:  AED/CPR: - Group verbal and written instruction with the use of models to demonstrate the basic use of the AED with the basic ABC's of resuscitation.   Anatomy and Cardiac Procedures: - Group verbal and visual presentation and models provide information about basic cardiac anatomy and function.  Reviews the testing methods done to diagnose heart disease and the outcomes of the test results. Describes the treatment choices: Medical Management, Angioplasty, or Coronary Bypass Surgery for treating various heart conditions including Myocardial Infarction, Angina, Valve Disease, and Cardiac Arrhythmias.  Written material given at graduation.   Medication Safety: - Group verbal and visual instruction to review commonly prescribed medications for heart and lung disease. Reviews the medication, class of the drug, and side effects. Includes the steps to properly store meds and maintain the prescription regimen.  Written material given at graduation.   Intimacy: - Group verbal instruction through game format to discuss how heart and lung disease can affect sexual intimacy. Written material given at graduation..   Know Your Numbers and Heart Failure: - Group verbal and visual instruction to discuss disease risk factors for cardiac and pulmonary disease and treatment options.  Reviews associated critical values for Overweight/Obesity, Hypertension, Cholesterol, and Diabetes.  Discusses basics of heart failure: signs/symptoms and treatments.  Introduces Heart Failure Zone chart for action plan for heart failure.  Written material given at graduation.   Infection Prevention: - Provides verbal and written  material to individual with discussion of infection control including proper hand washing and proper equipment cleaning during exercise session. Flowsheet Row Cardiac Rehab from 12/10/2022 in [facility] Cardiac and Pulmonary Rehab  Date 12/10/22  Educator NT  Instruction Review Code 1- Verbalizes Understanding       Falls Prevention: - Provides verbal and written material to individual with discussion of falls prevention and safety. Flowsheet Row Cardiac Rehab from 12/10/2022 in [facility] Cardiac and Pulmonary Rehab  Date 12/10/22  Educator NT  Instruction Review Code 1- Verbalizes Understanding        Other: -Provides group and verbal instruction on various topics (see comments)   Knowledge Questionnaire Score:  Knowledge Questionnaire Score - 12/10/22 1556       Knowledge Questionnaire Score   Pre Score 25/26             Core Components/Risk Factors/Patient Goals at Admission:  Personal Goals and Risk Factors at Admission - 12/10/22 1609       Core Components/Risk Factors/Patient Goals on Admission    Weight Management Yes    Intervention Weight Management: Develop a combined nutrition and exercise program designed to reach desired caloric intake, while maintaining appropriate intake of nutrient and fiber, sodium and fats, and appropriate energy expenditure required for the weight goal.;Weight Management: Provide education and appropriate resources to help participant work on and attain dietary goals.;Weight Management/Obesity: Establish reasonable short term and long term weight goals.    Admit Weight 248 lb 9.6 oz (112.8 kg)    Goal Weight: Short Term 245 lb (111.1 kg)    Goal Weight: Long Term 240 lb (108.9 kg)    Expected Outcomes Short Term: Continue to assess and modify interventions until short term weight is achieved;Long Term: Adherence to nutrition and physical activity/exercise program aimed toward attainment of established weight goal;Weight Loss: Understanding of general recommendations for a balanced deficit meal plan, which promotes 1-2 lb weight loss per week and includes a negative energy balance of [phone] kcal/d;Weight Maintenance: Understanding of the daily nutrition guidelines, which includes 25-35% calories from fat, 7% or less cal from saturated fats, less than 200mg  cholesterol, less than 1.5gm of sodium, & 5 or more servings of fruits and vegetables daily;Understanding recommendations for meals to include 15-35% energy as protein, 25-35% energy from fat, 35-60% energy from carbohydrates, less than 200mg  of dietary cholesterol, 20-35 gm of total fiber  daily;Understanding of distribution of calorie intake throughout the day with the consumption of 4-5 meals/snacks    Lipids Yes    Intervention Provide education and support for participant on nutrition & aerobic/resistive exercise along with prescribed medications to achieve LDL 70mg , HDL >40mg .    Expected Outcomes Short Term: Participant states understanding of desired cholesterol values and is compliant with medications prescribed. Participant is following exercise prescription and nutrition guidelines.;Long Term: Cholesterol controlled with medications as prescribed, with individualized exercise RX and with personalized nutrition plan. Value goals: LDL < 70mg , HDL > 40 mg.             Education:Diabetes - Individual verbal and written instruction to review signs/symptoms of diabetes, desired ranges of glucose level fasting, after meals and with exercise. Acknowledge that pre and post exercise glucose checks will be done for 3 sessions at entry of program.   Core Components/Risk Factors/Patient Goals Review:    Core Components/Risk Factors/Patient Goals at Discharge (Final Review):    ITP Comments:  ITP Comments     Row Name 11/26/22 1610 12/10/22 1525 01/02/23 3557  ITP Comments Initial phone call completed. Dx can be found in [facility] 6/21. EP orientation scheduled for 8/12 at 9:00 am. Completed and gym orientation. Initial ITP created and sent for review to Dr. Bethann Punches, Medical Director. 30 Day review completed. Medical Director ITP review done, changes made as directed, and signed approval by Medical Director.    new to program              Comments:

## 2023-01-02 NOTE — Progress Notes (Signed)
Daily Session Note  Patient Details  Name: Cassandra Robbins MRN: [id number] Date of Birth: 1971/01/04 Referring Provider:   Flowsheet Row Cardiac Rehab from 12/10/2022 in [facility] Cardiac and Pulmonary Rehab  Referring Provider Dr. [name], MD       Encounter Date: 01/02/2023  Check In:  Session Check In - 01/02/23 1020       Check-In   Supervising physician immediately available to respond to emergencies See telemetry face sheet for immediately available ER MD    Location [facility]-Cardiac & Pulmonary Rehab    Staff Present Lanny Hurst, RN, ADN;Margaret Best, MS, Exercise Physiologist;Maxon Conetta BS, , Exercise Physiologist;Noah Tickle, BS, Exercise Physiologist    Virtual Visit No    Medication changes reported     No    Fall or balance concerns reported    No    Warm-up and Cool-down Performed on first and last piece of equipment    Resistance Training Performed Yes    VAD Patient? No    PAD/SET Patient? No      Pain Assessment   Currently in Pain? No/denies                Social History   Tobacco Use  Smoking Status Never  Smokeless Tobacco Never    Goals Met:  Independence with exercise equipment Exercise tolerated well No report of concerns or symptoms today Strength training completed today  Goals Unmet:  Not Applicable  Comments: First full day of exercise!  Patient was oriented to gym and equipment including functions, settings, policies, and procedures.  Patient's individual exercise prescription and treatment plan were reviewed.  All starting workloads were established based on the results of the 6 minute walk test done at initial orientation visit.  The plan for exercise progression was also introduced and progression will be customized based on patient's performance and goals.    Dr. Bethann Punches is Medical Director for [facility] Cardiac Rehabilitation.  Dr. Vida Rigger is Medical Director for [facility] Pulmonary Rehabilitation.

## 2023-01-04 ENCOUNTER — Encounter: Payer: 59 | Admitting: *Deleted

## 2023-01-04 DIAGNOSIS — Z48812 Encounter for surgical aftercare following surgery on the circulatory system: Secondary | ICD-10-CM | POA: Diagnosis not present

## 2023-01-04 DIAGNOSIS — Z955 Presence of coronary angioplasty implant and graft: Secondary | ICD-10-CM

## 2023-01-04 DIAGNOSIS — I2089 Other forms of angina pectoris: Secondary | ICD-10-CM | POA: Diagnosis not present

## 2023-01-04 NOTE — Progress Notes (Signed)
Daily Session Note  Patient Details  Name: Cassandra Robbins MRN: [id number] Date of Birth: 1970/11/25 Referring Provider:   Flowsheet Row Cardiac Rehab from 12/10/2022 in [facility] Cardiac and Pulmonary Rehab  Referring Provider Dr. [name], MD       Encounter Date: 01/04/2023  Check In:  Session Check In - 01/04/23 1003       Check-In   Supervising physician immediately available to respond to emergencies See telemetry face sheet for immediately available ER MD    Location [facility]-Cardiac & Pulmonary Rehab    Staff Present Cora Collum, RN, BSN, CCRP;Noah Tickle, BS, Exercise Physiologist;Joseph [place], [place]    Virtual Visit No    Medication changes reported     No    Fall or balance concerns reported    No    Warm-up and Cool-down Performed on first and last piece of equipment    Resistance Training Performed Yes    VAD Patient? No    PAD/SET Patient? No      Pain Assessment   Currently in Pain? No/denies                Social History   Tobacco Use  Smoking Status Never  Smokeless Tobacco Never    Goals Met:  Independence with exercise equipment Exercise tolerated well No report of concerns or symptoms today  Goals Unmet:  Not Applicable  Comments: Pt able to follow exercise prescription today without complaint.  Will continue to monitor for progression.    Dr. Bethann Punches is Medical Director for [facility] Cardiac Rehabilitation.  Dr. Vida Rigger is Medical Director for [facility] Pulmonary Rehabilitation.

## 2023-01-07 ENCOUNTER — Encounter: Payer: 59 | Admitting: *Deleted

## 2023-01-07 DIAGNOSIS — Z955 Presence of coronary angioplasty implant and graft: Secondary | ICD-10-CM

## 2023-01-07 DIAGNOSIS — I2089 Other forms of angina pectoris: Secondary | ICD-10-CM | POA: Diagnosis not present

## 2023-01-07 DIAGNOSIS — Z48812 Encounter for surgical aftercare following surgery on the circulatory system: Secondary | ICD-10-CM | POA: Diagnosis not present

## 2023-01-07 NOTE — Progress Notes (Signed)
Daily Session Note  Patient Details  Name: Cassandra Robbins MRN: [id number] Date of Birth: [birth year] Referring Provider:   Flowsheet Row Cardiac Rehab from 12/10/2022 in [facility] Cardiac and Pulmonary Rehab  Referring Provider Dr. [name], MD       Encounter Date: 01/07/2023  Check In:  Session Check In - 01/07/23 0930       Check-In   Supervising physician immediately available to respond to emergencies See telemetry face sheet for immediately available ER MD    Location [facility]-Cardiac & Pulmonary Rehab    Staff Present Tommye Standard, BS, ACSM CEP, Exercise Physiologist;Maxon Conetta BS, , Exercise Physiologist;Jaicob Dia, RN, BSN, CCRP    Virtual Visit No    Medication changes reported     No    Fall or balance concerns reported    No    Warm-up and Cool-down Performed on first and last piece of equipment    Resistance Training Performed Yes    VAD Patient? No    PAD/SET Patient? No      Pain Assessment   Currently in Pain? No/denies                Social History   Tobacco Use  Smoking Status Never  Smokeless Tobacco Never    Goals Met:  Independence with exercise equipment Exercise tolerated well No report of concerns or symptoms today  Goals Unmet:  Not Applicable  Comments: Pt able to follow exercise prescription today without complaint.  Will continue to monitor for progression.    Dr. Bethann Punches is Medical Director for [facility] Cardiac Rehabilitation.  Dr. Vida Rigger is Medical Director for [facility] Pulmonary Rehabilitation.

## 2023-01-09 ENCOUNTER — Encounter: Payer: 59 | Admitting: *Deleted

## 2023-01-09 ENCOUNTER — Other Ambulatory Visit: Payer: Self-pay | Admitting: Family Medicine

## 2023-01-09 DIAGNOSIS — I2089 Other forms of angina pectoris: Secondary | ICD-10-CM | POA: Diagnosis not present

## 2023-01-09 DIAGNOSIS — F909 Attention-deficit hyperactivity disorder, unspecified type: Secondary | ICD-10-CM

## 2023-01-09 DIAGNOSIS — Z48812 Encounter for surgical aftercare following surgery on the circulatory system: Secondary | ICD-10-CM | POA: Diagnosis not present

## 2023-01-09 DIAGNOSIS — Z955 Presence of coronary angioplasty implant and graft: Secondary | ICD-10-CM | POA: Diagnosis not present

## 2023-01-09 NOTE — Progress Notes (Signed)
Daily Session Note  Patient Details  Name: Cassandra Robbins MRN: [id number] Date of Birth: [birth year] Referring Provider:   Flowsheet Row Cardiac Rehab from 12/10/2022 in [facility] Cardiac and Pulmonary Rehab  Referring Provider Dr. [name], MD       Encounter Date: 01/09/2023  Check In:  Session Check In - 01/09/23 0929       Check-In   Supervising physician immediately available to respond to emergencies See telemetry face sheet for immediately available ER MD    Location [facility]-Cardiac & Pulmonary Rehab    Staff Present Rory Percy, MS, Exercise Physiologist;Joseph Shelbie Proctor, RN, ADN    Virtual Visit No    Medication changes reported     No    Fall or balance concerns reported    No    Warm-up and Cool-down Performed on first and last piece of equipment    Resistance Training Performed Yes    VAD Patient? No    PAD/SET Patient? No      Pain Assessment   Currently in Pain? No/denies                Social History   Tobacco Use  Smoking Status Never  Smokeless Tobacco Never    Goals Met:  Independence with exercise equipment Exercise tolerated well No report of concerns or symptoms today Strength training completed today  Goals Unmet:  Not Applicable  Comments: Pt able to follow exercise prescription today without complaint.  Will continue to monitor for progression.    Dr. Bethann Punches is Medical Director for [facility] Cardiac Rehabilitation.  Dr. Vida Rigger is Medical Director for [facility] Pulmonary Rehabilitation.

## 2023-01-10 ENCOUNTER — Encounter (HOSPITAL_BASED_OUTPATIENT_CLINIC_OR_DEPARTMENT_OTHER): Payer: Self-pay | Admitting: Pulmonary Disease

## 2023-01-10 ENCOUNTER — Ambulatory Visit (INDEPENDENT_AMBULATORY_CARE_PROVIDER_SITE_OTHER): Payer: 59 | Admitting: Pulmonary Disease

## 2023-01-10 VITALS — BP 126/80 | HR 88 | Resp 16 | Ht 69.69 in | Wt 249.8 lb

## 2023-01-10 DIAGNOSIS — R0609 Other forms of dyspnea: Secondary | ICD-10-CM

## 2023-01-10 NOTE — Progress Notes (Signed)
Subjective:    Patient ID: Cassandra Robbins, female    DOB: 05-09-1970, 52 y.o.   MRN: [id number]  HPI  52 year old presents for evaluation of dyspnea, fatigue and sleep disordered breathing. She reports onset of severe fatigue after viral illness January 2024 after a trip to [place].  She received 2 rounds of antibiotics and a clear chest x-ray but developed intense fatigue and shortness of breath even though her oxygen saturation stayed at 96%.  Since then she reports nonrestorative sleep, her bed partner has observed mild snoring. She developed choking sensation at the bottom of her neck, saw ENT and was referred to a cardiologist.  After cardiac CT, she underwent cardiac cath and received stents to RCA and left circumflex.  Since then she has noted remarkable improvement with her symptoms including fatigue. She has enrolled in cardiac rehab 3 days a week and is able to walk on the treadmill for 20 minutes achieve a heart rate of 130 and on the elliptical for 20 minutes reaching a heart rate of 150.  Epworth Sleepiness Scale is 3 which she reports some sleepiness in the afternoons but denies daytime naps. Bedtime is between 10 and 11 PM, sleep latency about 30 minutes, she sleeps on her left side with 1 pillow, reports 1-2 nocturnal awakenings due to her dogs and is out of bed at 7 AM feeling rested with dryness of mouth. She has gained about 10 pounds during the last few months. There is no history suggestive of cataplexy, sleep paralysis or parasomnias   PMH CAD s/p DES to RCA & LPAV ADHD    Significant tests/ events reviewed Spirometry 06/2022 poor effort, ratio normal, FEV1 79%, FVC 77%, mild restriction  CT coronaries 09/2022 clear lungs  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HCC)    Toe  fracture, right 10/08/2017    Past Surgical History:  Procedure Laterality Date   COLONOSCOPY WITH PROPOFOL N/A 02/21/2022   Procedure: COLONOSCOPY WITH PROPOFOL;  Surgeon: Toney Reil, MD;  Location: [facility] ENDOSCOPY;  Service: Gastroenterology;  Laterality: N/A;   CORONARY STENT INTERVENTION N/A 10/19/2022   Procedure: CORONARY STENT INTERVENTION;  Surgeon: Marykay Lex, MD;  Location: [facility] INVASIVE CV LAB;  Service: Cardiovascular;  Laterality: N/A;   LAPAROSCOPIC ENDOMETRIOSIS FULGURATION  2007   LEFT HEART CATH AND CORONARY ANGIOGRAPHY N/A 10/19/2022   Procedure: LEFT HEART CATH AND CORONARY ANGIOGRAPHY;  Surgeon: Marykay Lex, MD;  Location: [facility] INVASIVE CV LAB;  Service: Cardiovascular;  Laterality: N/A;   SEPTOPLASTY  2005   TUBAL LIGATION      Allergies  Allergen Reactions   Penicillins Anaphylaxis   Amoxicillin Rash   Morphine Itching    After third dose "itching"    Social History   Socioeconomic History   Marital status: Divorced    Spouse name: Not on file   Number of children: Not on file   Years of education: Not on file   Highest education level: Associate degree: academic program  Occupational History   Occupation: paramedic  Tobacco Use   Smoking status: Never    Passive exposure: Never   Smokeless tobacco: Never  Vaping Use   Vaping status: Never Used  Substance and Sexual Activity   Alcohol use: Yes  Alcohol/week: 2.0 - 4.0 standard drinks of alcohol    Types: 1 - 2 Glasses of wine, 1 - 2 Shots of liquor per week    Comment: on occasion   Drug use: No   Sexual activity: Yes    Birth control/protection: Other-see comments    Comment: Tibal ligation  Other Topics Concern   Not on file  Social History Narrative   She has been a paramedic for 32 years.     Social Determinants of Health   Financial Resource Strain: Low Risk  (08/02/2022)   Overall Financial Resource Strain (CARDIA)    Difficulty of Paying Living Expenses: Not hard at  all  Food Insecurity: No Food Insecurity (08/02/2022)   Hunger Vital Sign    Worried About Running Out of Food in the Last Year: Never true    Ran Out of Food in the Last Year: Never true  Transportation Needs: No Transportation Needs (08/02/2022)   PRAPARE - Administrator, Civil Service (Medical): No    Lack of Transportation (Non-Medical): No  Physical Activity: Unknown (08/02/2022)   Exercise Vital Sign    Days of Exercise per Week: 0 days    Minutes of Exercise per Session: Not on file  Stress: Stress Concern Present (08/02/2022)   [employer] of Occupational Health - Occupational Stress Questionnaire    Feeling of Stress : To some extent  Social Connections: Moderately Integrated (08/02/2022)   Social Connection and Isolation Panel [NHANES]    Frequency of Communication with Friends and Family: More than three times a week    Frequency of Social Gatherings with Friends and Family: Three times a week    Attends Religious Services: More than 4 times per year    Active Member of Clubs or Organizations: Yes    Attends Engineer, structural: More than 4 times per year    Marital Status: Divorced  Catering manager Violence: Not on file    Family History  Problem Relation Age of Onset   Asthma Mother    Lung disease Mother    Thyroid disease Mother    Cancer Father 56       Nasopharyngeal   Lymphoma Sister    Rheum arthritis Sister    Pulmonary fibrosis Maternal Grandfather    Breast cancer Neg Hx       Review of Systems Constitutional: negative for anorexia, fevers and sweats  Eyes: negative for irritation, redness and visual disturbance  Ears, nose, mouth, throat, and face: negative for earaches, epistaxis, nasal congestion and sore throat  Respiratory: negative for cough, dyspnea on exertion, sputum and wheezing  Cardiovascular: negative for chest pain, dyspnea, lower extremity edema, orthopnea, palpitations and syncope  Gastrointestinal: negative for  abdominal pain, constipation, diarrhea, melena, nausea and vomiting  Genitourinary:negative for dysuria, frequency and hematuria  Hematologic/lymphatic: negative for bleeding, easy bruising and lymphadenopathy  Musculoskeletal:negative for arthralgias, muscle weakness and stiff joints  Neurological: negative for coordination problems, gait problems, headaches and weakness  Endocrine: negative for diabetic symptoms including polydipsia, polyuria and weight loss     Objective:   Physical Exam  Gen. Pleasant, obese, in no distress, normal affect ENT - no pallor,icterus, no post nasal drip, class 2-3 airway Neck: No JVD, no thyromegaly, no carotid bruits Lungs: no use of accessory muscles, no dullness to percussion, decreased without rales or rhonchi  Cardiovascular: Rhythm regular, heart sounds  normal, no murmurs or gallops, no peripheral edema Abdomen: soft and non-tender, no hepatosplenomegaly, BS  normal. Musculoskeletal: No deformities, no cyanosis or clubbing Neuro:  alert, non focal, no tremors       Assessment & Plan:    Sleep disordered breathing/OSA -Her symptoms of excessive daytime fatigue and dyspnea have significantly improved since stent placement.  She is able to participate in cardiac rehab and is focused on weight loss.  She does carry a diagnosis of ADHD and takes Adderall in the daytime which may be masking some of her symptoms but regardless, she does not have any red flags of choking episodes in her sleep or excessive daytime sleepiness. We discussed the role of sleep testing and treatment options including dental appliance and CPAP therapy.  She would like to hold off testing for now and focus on her rehab and weight loss measures. If she has recurrent fatigue or if she plateaus, then we can consider home sleep testing  The pathophysiology of obstructive sleep apnea , it's cardiovascular consequences & modes of treatment including CPAP were discused with the patient  in detail & they evidenced understanding.

## 2023-01-10 NOTE — Patient Instructions (Signed)
We will hold off on further testing since you feel much improved.  Call us back if snoring is worse or if fatigue persists and we can schedule home sleep test

## 2023-01-10 NOTE — Assessment & Plan Note (Signed)
Much improved after stent placement. She does not seem to have any pulmonary pathology

## 2023-01-11 ENCOUNTER — Encounter: Payer: 59 | Admitting: *Deleted

## 2023-01-11 DIAGNOSIS — Z955 Presence of coronary angioplasty implant and graft: Secondary | ICD-10-CM

## 2023-01-11 DIAGNOSIS — I2089 Other forms of angina pectoris: Secondary | ICD-10-CM | POA: Diagnosis not present

## 2023-01-11 DIAGNOSIS — Z48812 Encounter for surgical aftercare following surgery on the circulatory system: Secondary | ICD-10-CM | POA: Diagnosis not present

## 2023-01-11 NOTE — Progress Notes (Signed)
Daily Session Note  Patient Details  Name: Cassandra Robbins MRN: [id number] Date of Birth: Sep 10, 1970 Referring Provider:   Flowsheet Row Cardiac Rehab from 12/10/2022 in [facility] Cardiac and Pulmonary Rehab  Referring Provider Dr. [name], MD       Encounter Date: 01/11/2023  Check In:  Session Check In - 01/11/23 1008       Check-In   Supervising physician immediately available to respond to emergencies See telemetry face sheet for immediately available ER MD    Location [facility]-Cardiac & Pulmonary Rehab    Staff Present Cora Collum, RN, BSN, CCRP;Joseph Hood, RCP,RRT,BSRT;Noah Tickle, [place], Exercise Physiologist    Virtual Visit No    Medication changes reported     No    Fall or balance concerns reported    No    Warm-up and Cool-down Performed on first and last piece of equipment    Resistance Training Performed Yes    PAD/SET Patient? No      Pain Assessment   Currently in Pain? No/denies                Social History   Tobacco Use  Smoking Status Never   Passive exposure: Never  Smokeless Tobacco Never    Goals Met:  Independence with exercise equipment Exercise tolerated well No report of concerns or symptoms today  Goals Unmet:  Not Applicable  Comments: Pt able to follow exercise prescription today without complaint.  Will continue to monitor for progression.    Dr. Bethann Punches is Medical Director for [facility] Cardiac Rehabilitation.  Dr. Vida Rigger is Medical Director for [facility] Pulmonary Rehabilitation.

## 2023-01-14 ENCOUNTER — Encounter: Payer: 59 | Admitting: *Deleted

## 2023-01-14 ENCOUNTER — Other Ambulatory Visit: Payer: Self-pay | Admitting: Family Medicine

## 2023-01-14 DIAGNOSIS — Z955 Presence of coronary angioplasty implant and graft: Secondary | ICD-10-CM | POA: Diagnosis not present

## 2023-01-14 DIAGNOSIS — Z48812 Encounter for surgical aftercare following surgery on the circulatory system: Secondary | ICD-10-CM | POA: Diagnosis not present

## 2023-01-14 DIAGNOSIS — F909 Attention-deficit hyperactivity disorder, unspecified type: Secondary | ICD-10-CM

## 2023-01-14 DIAGNOSIS — I2089 Other forms of angina pectoris: Secondary | ICD-10-CM | POA: Diagnosis not present

## 2023-01-14 NOTE — Progress Notes (Signed)
Daily Session Note  Patient Details  Name: Cassandra Robbins MRN: [id number] Date of Birth: 04-22-1971 Referring Provider:   Flowsheet Row Cardiac Rehab from 12/10/2022 in [facility] Cardiac and Pulmonary Rehab  Referring Provider Dr. [name], MD       Encounter Date: 01/14/2023  Check In:  Session Check In - 01/14/23 0934       Check-In   Supervising physician immediately available to respond to emergencies See telemetry face sheet for immediately available ER MD    Location [facility]-Cardiac & Pulmonary Rehab    Staff Present Maxon Conetta BS, , Exercise Physiologist;Kelly Madilyn Fireman, BS, ACSM CEP, Exercise Physiologist;March Steyer Katrinka Blazing, RN, ADN    Virtual Visit No    Medication changes reported     No    Fall or balance concerns reported    No    Warm-up and Cool-down Performed on first and last piece of equipment    Resistance Training Performed Yes    VAD Patient? No    PAD/SET Patient? No      Pain Assessment   Currently in Pain? No/denies                Social History   Tobacco Use  Smoking Status Never   Passive exposure: Never  Smokeless Tobacco Never    Goals Met:  Independence with exercise equipment Exercise tolerated well No report of concerns or symptoms today Strength training completed today  Goals Unmet:  Not Applicable  Comments: Pt able to follow exercise prescription today without complaint.  Will continue to monitor for progression.    Dr. Bethann Punches is Medical Director for [facility] Cardiac Rehabilitation.  Dr. Vida Rigger is Medical Director for [facility] Pulmonary Rehabilitation.

## 2023-01-16 ENCOUNTER — Encounter: Payer: 59 | Admitting: *Deleted

## 2023-01-16 DIAGNOSIS — Z955 Presence of coronary angioplasty implant and graft: Secondary | ICD-10-CM | POA: Diagnosis not present

## 2023-01-16 DIAGNOSIS — Z48812 Encounter for surgical aftercare following surgery on the circulatory system: Secondary | ICD-10-CM | POA: Diagnosis not present

## 2023-01-16 DIAGNOSIS — I2089 Other forms of angina pectoris: Secondary | ICD-10-CM | POA: Diagnosis not present

## 2023-01-16 NOTE — Telephone Encounter (Signed)
Needs appt

## 2023-01-16 NOTE — Progress Notes (Signed)
Daily Session Note  Patient Details  Name: Cassandra Robbins MRN: [id number] Date of Birth: [birth year] Referring Provider:   Flowsheet Row Cardiac Rehab from 12/10/2022 in [facility] Cardiac and Pulmonary Rehab  Referring Provider Dr. [name], MD       Encounter Date: 01/16/2023  Check In:  Session Check In - 01/16/23 0930       Check-In   Supervising physician immediately available to respond to emergencies See telemetry face sheet for immediately available ER MD    Location [facility]-Cardiac & Pulmonary Rehab    Staff Present Rory Percy, MS, Exercise Physiologist;Joseph Shelbie Proctor, RN, ADN    Virtual Visit No    Medication changes reported     No    Fall or balance concerns reported    No    Warm-up and Cool-down Performed on first and last piece of equipment    Resistance Training Performed Yes    VAD Patient? No    PAD/SET Patient? No      Pain Assessment   Currently in Pain? No/denies                Social History   Tobacco Use  Smoking Status Never   Passive exposure: Never  Smokeless Tobacco Never    Goals Met:  Independence with exercise equipment Exercise tolerated well No report of concerns or symptoms today Strength training completed today  Goals Unmet:  Not Applicable  Comments: Pt able to follow exercise prescription today without complaint.  Will continue to monitor for progression.    Dr. Bethann Punches is Medical Director for [facility] Cardiac Rehabilitation.  Dr. Vida Rigger is Medical Director for [facility] Pulmonary Rehabilitation.

## 2023-01-16 NOTE — Telephone Encounter (Signed)
Pt scheduled appointment on 01/17/2023 @  10:00 am.  Routing to provider.

## 2023-01-17 ENCOUNTER — Encounter: Payer: Self-pay | Admitting: Family Medicine

## 2023-01-17 ENCOUNTER — Ambulatory Visit (INDEPENDENT_AMBULATORY_CARE_PROVIDER_SITE_OTHER): Payer: 59 | Admitting: Family Medicine

## 2023-01-17 VITALS — BP 117/81 | HR 79 | Ht 69.0 in | Wt 249.8 lb

## 2023-01-17 DIAGNOSIS — F909 Attention-deficit hyperactivity disorder, unspecified type: Secondary | ICD-10-CM | POA: Diagnosis not present

## 2023-01-17 MED ORDER — AMPHETAMINE-DEXTROAMPHETAMINE 20 MG PO TABS
20.0000 mg | ORAL_TABLET | Freq: Two times a day (BID) | ORAL | 0 refills | Status: DC
Start: 2023-01-17 — End: 2023-04-03

## 2023-01-17 MED ORDER — AMPHETAMINE-DEXTROAMPHETAMINE 20 MG PO TABS: 20.0000 mg | ORAL_TABLET | Freq: Two times a day (BID) | ORAL | 0 refills | Status: DC

## 2023-01-17 NOTE — Progress Notes (Signed)
BP 117/81   Pulse 79   Ht 5\' 9"  (1.753 m)   Wt 249 lb 12.8 oz (113.3 kg)   SpO2 98%   BMI 36.89 kg/m    Subjective:    Patient ID: Cassandra Robbins, female    DOB: 01-23-1971, 52 y.o.   MRN: [id number]  HPI: Cassandra Robbins is a 52 y.o. female  Chief Complaint  Patient presents with   ADHD    Patient is requesting a refill on her Adderrall prescription at today's visit.    ADHD FOLLOW UP- does not always take a full pill in the PM ADHD status: controlled Satisfied with current therapy: yes Medication compliance:  good compliance Controlled substance contract: yes Previous psychiatry evaluation: no Previous medications: yes    Taking meds on weekends/vacations: yes Work/school performance:  good Difficulty sustaining attention/completing tasks: no Distracted by extraneous stimuli: no Does not listen when spoken to: no  Fidgets with hands or feet: no Unable to stay in seat: no Blurts out/interrupts others: no ADHD Medication Side Effects: no    Decreased appetite: no    Headache: no    Sleeping disturbance pattern: no    Irritability: no    Rebound effects (worse than baseline) off medication: no    Anxiousness: no    Dizziness: no    Tics: no'  Relevant past medical, surgical, family and social history reviewed and updated as indicated. Interim medical history since our last visit reviewed. Allergies and medications reviewed and updated.  Review of Systems  Constitutional: Negative.   Respiratory: Negative.    Cardiovascular: Negative.   Gastrointestinal: Negative.   Musculoskeletal: Negative.   Psychiatric/Behavioral: Negative.      Per HPI unless specifically indicated above     Objective:    BP 117/81   Pulse 79   Ht 5\' 9"  (1.753 m)   Wt 249 lb 12.8 oz (113.3 kg)   SpO2 98%   BMI 36.89 kg/m   Wt Readings from Last 3 Encounters:  01/17/23 249 lb 12.8 oz (113.3 kg)  01/10/23 249 lb 12.8 oz (113.3 kg)  12/10/22 248 lb 9.6 oz (112.8 kg)     Physical Exam Vitals and nursing note reviewed.  Constitutional:      General: She is not in acute distress.    Appearance: Normal appearance. She is not ill-appearing, toxic-appearing or diaphoretic.  HENT:     Head: Normocephalic and atraumatic.     Right Ear: External ear normal.     Left Ear: External ear normal.     Nose: Nose normal.     Mouth/Throat:     Mouth: Mucous membranes are moist.     Pharynx: Oropharynx is clear.  Eyes:     General: No scleral icterus.       Right eye: No discharge.        Left eye: No discharge.     Extraocular Movements: Extraocular movements intact.     Conjunctiva/sclera: Conjunctivae normal.     Pupils: Pupils are equal, round, and reactive to light.  Cardiovascular:     Rate and Rhythm: Normal rate and regular rhythm.     Pulses: Normal pulses.     Heart sounds: Normal heart sounds. No murmur heard.    No friction rub. No gallop.  Pulmonary:     Effort: Pulmonary effort is normal. No respiratory distress.     Breath sounds: Normal breath sounds. No stridor. No wheezing, rhonchi or rales.  Chest:  Chest wall: No tenderness.  Musculoskeletal:        General: Normal range of motion.     Cervical back: Normal range of motion and neck supple.  Skin:    General: Skin is warm and dry.     Capillary Refill: Capillary refill takes less than 2 seconds.     Coloration: Skin is not jaundiced or pale.     Findings: No bruising, erythema, lesion or rash.  Neurological:     General: No focal deficit present.     Mental Status: She is alert and oriented to person, place, and time. Mental status is at baseline.  Psychiatric:        Mood and Affect: Mood normal.        Behavior: Behavior normal.        Thought Content: Thought content normal.        Judgment: Judgment normal.     Results for orders placed or performed during the hospital encounter of 12/17/22  Hepatic function panel  Result Value Ref Range   Total Protein 7.3 6.5 - 8.1  g/dL   Albumin 4.0 3.5 - 5.0 g/dL   AST 21 15 - 41 U/L   ALT 21 0 - 44 U/L   Alkaline Phosphatase 94 38 - 126 U/L   Total Bilirubin 0.6 0.3 - 1.2 mg/dL   Bilirubin, Direct <1.3 0.0 - 0.2 mg/dL   Indirect Bilirubin NOT CALCULATED 0.3 - 0.9 mg/dL  Direct LDL  Result Value Ref Range   Direct LDL 86 0 - 99 mg/dL  Lipid panel  Result Value Ref Range   Cholesterol 145 0 - 200 mg/dL   Triglycerides 244 <010 mg/dL   HDL 49 >27 mg/dL   Total CHOL/HDL Ratio 3.0 RATIO   VLDL 22 0 - 40 mg/dL   LDL Cholesterol 74 0 - 99 mg/dL      Assessment & Plan:   Problem List Items Addressed This Visit       Other   Adult ADHD - Primary    Under good control on current regimen. Continue current regimen. Continue to monitor. Call with any concerns. Refills given for 3 months. Follow up 3 months.        Relevant Medications   amphetamine-dextroamphetamine (ADDERALL) 20 MG tablet     Follow up plan: Return in about 6 months (around 07/17/2023) for physical.

## 2023-01-17 NOTE — Assessment & Plan Note (Signed)
Under good control on current regimen. Continue current regimen. Continue to monitor. Call with any concerns. Refills given for 3 months. Follow up 3 months.

## 2023-01-18 ENCOUNTER — Encounter: Payer: 59 | Admitting: *Deleted

## 2023-01-18 DIAGNOSIS — Z955 Presence of coronary angioplasty implant and graft: Secondary | ICD-10-CM | POA: Diagnosis not present

## 2023-01-18 DIAGNOSIS — Z48812 Encounter for surgical aftercare following surgery on the circulatory system: Secondary | ICD-10-CM | POA: Diagnosis not present

## 2023-01-18 DIAGNOSIS — I2089 Other forms of angina pectoris: Secondary | ICD-10-CM | POA: Diagnosis not present

## 2023-01-18 NOTE — Progress Notes (Signed)
Daily Session Note  Patient Details  Name: PHYLISS SWEELEY MRN: [id number] Date of Birth: 06/09/1970 Referring Provider:   Flowsheet Row Cardiac Rehab from 12/10/2022 in [facility] Cardiac and Pulmonary Rehab  Referring Provider Dr. [name], MD       Encounter Date: 01/18/2023  Check In:  Session Check In - 01/18/23 1005       Check-In   Supervising physician immediately available to respond to emergencies See telemetry face sheet for immediately available ER MD    Location [facility]-Cardiac & Pulmonary Rehab    Staff Present Cora Collum, RN, BSN, CCRP;Noah Tickle, BS, Exercise Physiologist;Joseph [place], [place]    Virtual Visit No    Medication changes reported     No    Fall or balance concerns reported    No    Warm-up and Cool-down Performed on first and last piece of equipment    Resistance Training Performed Yes    VAD Patient? No    PAD/SET Patient? No      Pain Assessment   Currently in Pain? No/denies                Social History   Tobacco Use  Smoking Status Never   Passive exposure: Never  Smokeless Tobacco Never    Goals Met:  Independence with exercise equipment Exercise tolerated well No report of concerns or symptoms today  Goals Unmet:  Not Applicable  Comments: Pt able to follow exercise prescription today without complaint.  Will continue to monitor for progression.    Dr. Bethann Punches is Medical Director for [facility] Cardiac Rehabilitation.  Dr. Vida Rigger is Medical Director for [facility] Pulmonary Rehabilitation.

## 2023-01-21 ENCOUNTER — Encounter: Payer: 59 | Admitting: *Deleted

## 2023-01-21 DIAGNOSIS — Z955 Presence of coronary angioplasty implant and graft: Secondary | ICD-10-CM | POA: Diagnosis not present

## 2023-01-21 DIAGNOSIS — I2089 Other forms of angina pectoris: Secondary | ICD-10-CM | POA: Diagnosis not present

## 2023-01-21 DIAGNOSIS — Z48812 Encounter for surgical aftercare following surgery on the circulatory system: Secondary | ICD-10-CM | POA: Diagnosis not present

## 2023-01-21 NOTE — Progress Notes (Signed)
Daily Session Note  Patient Details  Name: Cassandra Robbins MRN: [id number] Date of Birth: [birth year] Referring Provider:   Flowsheet Row Cardiac Rehab from 12/10/2022 in [facility] Cardiac and Pulmonary Rehab  Referring Provider Dr. [name], MD       Encounter Date: 01/21/2023  Check In:  Session Check In - 01/21/23 0922       Check-In   Supervising physician immediately available to respond to emergencies See telemetry face sheet for immediately available ER MD    Location [facility]-Cardiac & Pulmonary Rehab    Staff Present Maxon Conetta BS, , Exercise Physiologist;Kelly Madilyn Fireman, BS, ACSM CEP, Exercise Physiologist;Adon Gehlhausen Katrinka Blazing, RN, ADN    Virtual Visit No    Medication changes reported     No    Fall or balance concerns reported    No    Warm-up and Cool-down Performed on first and last piece of equipment    Resistance Training Performed Yes    VAD Patient? No    PAD/SET Patient? No      Pain Assessment   Currently in Pain? No/denies                Social History   Tobacco Use  Smoking Status Never   Passive exposure: Never  Smokeless Tobacco Never    Goals Met:  Independence with exercise equipment Exercise tolerated well No report of concerns or symptoms today Strength training completed today  Goals Unmet:  Not Applicable  Comments: Pt able to follow exercise prescription today without complaint.  Will continue to monitor for progression.    Dr. Bethann Punches is Medical Director for [facility] Cardiac Rehabilitation.  Dr. Vida Rigger is Medical Director for [facility] Pulmonary Rehabilitation.

## 2023-01-23 ENCOUNTER — Encounter: Payer: 59 | Admitting: *Deleted

## 2023-01-23 DIAGNOSIS — Z48812 Encounter for surgical aftercare following surgery on the circulatory system: Secondary | ICD-10-CM | POA: Diagnosis not present

## 2023-01-23 DIAGNOSIS — Z955 Presence of coronary angioplasty implant and graft: Secondary | ICD-10-CM | POA: Diagnosis not present

## 2023-01-23 DIAGNOSIS — I2089 Other forms of angina pectoris: Secondary | ICD-10-CM | POA: Diagnosis not present

## 2023-01-23 NOTE — Progress Notes (Signed)
Daily Session Note  Patient Details  Name: Cassandra Robbins MRN: [id number] Date of Birth: 1970-06-11 Referring Provider:   Flowsheet Row Cardiac Rehab from 12/10/2022 in [facility] Cardiac and Pulmonary Rehab  Referring Provider Dr. [name], MD       Encounter Date: 01/23/2023  Check In:  Session Check In - 01/23/23 0930       Check-In   Supervising physician immediately available to respond to emergencies See telemetry face sheet for immediately available ER MD    Location [facility]-Cardiac & Pulmonary Rehab    Staff Present Hulen Luster, BS, RRT, CPFT;Batool Majid Katrinka Blazing, RN, Eben Burow RN, BSN    Virtual Visit No    Medication changes reported     No    Fall or balance concerns reported    No    Warm-up and Cool-down Performed on first and last piece of equipment    Resistance Training Performed Yes    VAD Patient? No    PAD/SET Patient? No      Pain Assessment   Currently in Pain? No/denies                Social History   Tobacco Use  Smoking Status Never   Passive exposure: Never  Smokeless Tobacco Never    Goals Met:  Independence with exercise equipment Exercise tolerated well No report of concerns or symptoms today Strength training completed today  Goals Unmet:  Not Applicable  Comments: Pt able to follow exercise prescription today without complaint.  Will continue to monitor for progression.    Dr. Bethann Punches is Medical Director for [facility] Cardiac Rehabilitation.  Dr. Vida Rigger is Medical Director for [facility] Pulmonary Rehabilitation.

## 2023-01-25 DIAGNOSIS — Z48812 Encounter for surgical aftercare following surgery on the circulatory system: Secondary | ICD-10-CM | POA: Diagnosis not present

## 2023-01-25 DIAGNOSIS — Z955 Presence of coronary angioplasty implant and graft: Secondary | ICD-10-CM

## 2023-01-25 DIAGNOSIS — I2089 Other forms of angina pectoris: Secondary | ICD-10-CM | POA: Diagnosis not present

## 2023-01-25 NOTE — Progress Notes (Signed)
Daily Session Note  Patient Details  Name: Cassandra Robbins MRN: [id number] Date of Birth: 07-Oct-1970 Referring Provider:   Flowsheet Row Cardiac Rehab from 12/10/2022 in [facility] Cardiac and Pulmonary Rehab  Referring Provider Dr. [name], MD       Encounter Date: 01/25/2023  Check In:  Session Check In - 01/25/23 0923       Check-In   Supervising physician immediately available to respond to emergencies See telemetry face sheet for immediately available ER MD    Location [facility]-Cardiac & Pulmonary Rehab    Staff Present Harlene Ramus, RN, BSN;Joseph Hood, RCP,RRT,BSRT;Noah Tickle, [place], Exercise Physiologist    Virtual Visit No    Medication changes reported     No    Fall or balance concerns reported    No    Tobacco Cessation No Change    Warm-up and Cool-down Performed on first and last piece of equipment    Resistance Training Performed Yes    VAD Patient? No    PAD/SET Patient? No      Pain Assessment   Currently in Pain? No/denies                Social History   Tobacco Use  Smoking Status Never   Passive exposure: Never  Smokeless Tobacco Never    Goals Met:  Independence with exercise equipment Exercise tolerated well No report of concerns or symptoms today Strength training completed today  Goals Unmet:  Not Applicable  Comments: Pt able to follow exercise prescription today without complaint.  Will continue to monitor for progression.   Dr. Bethann Punches is Medical Director for [facility] Cardiac Rehabilitation.  Dr. Vida Rigger is Medical Director for [facility] Pulmonary Rehabilitation.

## 2023-01-29 DIAGNOSIS — Z419 Encounter for procedure for purposes other than remedying health state, unspecified: Secondary | ICD-10-CM | POA: Diagnosis not present

## 2023-01-30 ENCOUNTER — Encounter: Payer: 59 | Attending: Cardiology | Admitting: *Deleted

## 2023-01-30 ENCOUNTER — Encounter: Payer: Self-pay | Admitting: *Deleted

## 2023-01-30 DIAGNOSIS — Z955 Presence of coronary angioplasty implant and graft: Secondary | ICD-10-CM | POA: Diagnosis not present

## 2023-01-30 DIAGNOSIS — Z48812 Encounter for surgical aftercare following surgery on the circulatory system: Secondary | ICD-10-CM | POA: Insufficient documentation

## 2023-01-30 NOTE — Progress Notes (Signed)
Cardiac Individual Treatment Plan  Patient Details  Name: Cassandra Robbins MRN: [id number] Date of Birth: [birth year] Referring Provider:   Flowsheet Row Cardiac Rehab from 12/10/2022 in [facility] Cardiac and Pulmonary Rehab  Referring Provider Dr. [name], MD       Initial Encounter Date:  Flowsheet Row Cardiac Rehab from 12/10/2022 in [facility] Cardiac and Pulmonary Rehab  Date 12/10/22       Visit Diagnosis: Status post coronary artery stent placement  Patient's Home Medications on Admission:  Current Outpatient Medications:    acetaminophen (TYLENOL) 500 MG tablet, Take 1,000 mg by mouth every 8 (eight) hours as needed for moderate pain., Disp: , Rfl:    amphetamine-dextroamphetamine (ADDERALL) 20 MG tablet, Take 1 tablet (20 mg total) by mouth 2 (two) times daily., Disp: 60 tablet, Rfl: 0   [START ON 02/16/2023] amphetamine-dextroamphetamine (ADDERALL) 20 MG tablet, Take 1 tablet (20 mg total) by mouth 2 (two) times daily., Disp: 60 tablet, Rfl: 0   [START ON 03/18/2023] amphetamine-dextroamphetamine (ADDERALL) 20 MG tablet, Take 1 tablet (20 mg total) by mouth 2 (two) times daily., Disp: 60 tablet, Rfl: 0   aspirin EC 81 MG tablet, Take 1 tablet (81 mg total) by mouth daily. Swallow whole., Disp: 90 tablet, Rfl: 3   cetirizine (ZYRTEC) 10 MG tablet, Take 10 mg by mouth daily., Disp: , Rfl:    clopidogrel (PLAVIX) 75 MG tablet, Take 1 tablet (75 mg total) by mouth daily with breakfast., Disp: 90 tablet, Rfl: 3   fluticasone (FLONASE) 50 MCG/ACT nasal spray, Place 2 sprays into both nostrils daily., Disp: 16 g, Rfl: 6   ibuprofen (ADVIL) 200 MG tablet, Take 600 mg by mouth every 8 (eight) hours as needed for moderate pain., Disp: , Rfl:    ketoconazole (NIZORAL) 2 % cream, Apply 1 Application topically daily. (Patient taking differently: Apply 1 Application topically daily as needed for irritation.), Disp: 30 g, Rfl: 0   nitroGLYCERIN (NITROSTAT) 0.4 MG SL tablet, Place 1 tablet (0.4 mg  total) under the tongue every 5 (five) minutes as needed for chest pain., Disp: 25 tablet, Rfl: 3   pantoprazole (PROTONIX) 40 MG tablet, Take 1 tablet (40 mg total) by mouth daily., Disp: 90 tablet, Rfl: 1   rosuvastatin (CRESTOR) 10 MG tablet, Take 1 tablet (10 mg total) by mouth daily. (Patient taking differently: Take 10 mg by mouth every other day.), Disp: 90 tablet, Rfl: 3   sodium chloride (OCEAN) 0.65 % SOLN nasal spray, Place 1 spray into both nostrils as needed for congestion., Disp: , Rfl:   Past Medical History: 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HCC)    Toe fracture, right 10/08/2017    Tobacco Use: Social History   Tobacco Use  Smoking Status Never   Passive exposure: Never  Smokeless Tobacco Never    Labs: Review Flowsheet  More data exists      Latest Ref Rng & Units 11/30/2020 01/26/2021 01/08/2022 10/12/2022 12/17/2022  Labs for ITP Cardiac and Pulmonary Rehab  Cholestrol 0 - 200 mg/dL - 409  811  914  782   LDL (calc) 0 - 99 mg/dL - 956  213  086  74   Direct LDL 0 - 99 mg/dL - - - 578  86  HDL-C >40 mg/dL - 54  57  59  49   Trlycerides <150 mg/dL - 130  865  99  784   Hemoglobin A1c 4.8 - 5.6 % 5.9  - 6.1  - -    Details             Exercise Target Goals: Exercise Program Goal: Individual exercise prescription set using results from initial 6 min walk test and THRR while considering  patient's activity barriers and safety.   Exercise Prescription Goal: Initial exercise prescription builds to 30-45 minutes a day of aerobic activity, 2-3 days per week.  Home exercise guidelines will be given to patient during program as part of exercise prescription that the participant will acknowledge.   Education: Aerobic Exercise: - Group verbal and visual presentation on the  components of exercise prescription. Introduces F.I.T.T principle from ACSM for exercise prescriptions.  Reviews F.I.T.T. principles of aerobic exercise including progression. Written material given at graduation. Flowsheet Row Cardiac Rehab from 12/10/2022 in [facility] Cardiac [facility] Pulmonary Rehab  Education need identified 12/10/22       Education: Resistance Exercise: - Group verbal and visual presentation on the components of exercise prescription. Introduces F.I.T.T principle from ACSM for exercise prescriptions  Reviews F.I.T.T. principles of resistance exercise including progression. Written material given at graduation.    Education: Exercise & Equipment Safety: - Individual verbal instruction and demonstration of equipment use and safety with use of the equipment. Flowsheet Row Cardiac Rehab from 12/10/2022 in [facility] Cardiac and Pulmonary Rehab  Date 12/10/22  Educator NT  Instruction Review Code 1- Verbalizes Understanding       Education: Exercise Physiology & General Exercise Guidelines: - Group verbal and written instruction with models to review the exercise physiology of the cardiovascular system and associated critical values. Provides general exercise guidelines with specific guidelines to those with heart or lung disease.    Education: Flexibility, Balance, Mind/Body Relaxation: - Group verbal and visual presentation with interactive activity on the components of exercise prescription. Introduces F.I.T.T principle from ACSM for exercise prescriptions. Reviews F.I.T.T. principles of flexibility and balance exercise training including progression. Also discusses the mind body connection.  Reviews various relaxation techniques to help reduce and manage stress (i.e. Deep breathing, progressive muscle relaxation, and visualization). Balance handout provided to take home. Written material given at graduation.   Activity Barriers & Risk Stratification:  Activity Barriers & Cardiac Risk  Stratification - 12/10/22 1531       Activity Barriers & Cardiac Risk Stratification   Activity Barriers Arthritis    Cardiac Risk Stratification High             6 Minute Walk:  6 Minute Walk     Row Name 12/10/22 1529         6 Minute Walk   Phase Initial     Distance 1600 feet     Walk Time 6 minutes     # of Rest Breaks 0     MPH 3.03     METS 4.49     RPE 13     Perceived Dyspnea  1     VO2 Peak 15.71     Symptoms No     Resting HR 88 bpm     Resting BP 134/74     Resting Oxygen Saturation  100 %     Exercise Oxygen Saturation  during 6 min walk 96 %     Max Ex. HR 123 bpm     Max  Ex. BP 166/72     2 Minute Post BP 148/72              Oxygen Initial Assessment:   Oxygen Re-Evaluation:   Oxygen Discharge (Final Oxygen Re-Evaluation):   Initial Exercise Prescription:  Initial Exercise Prescription - 12/10/22 1500       Date of Initial Exercise RX and Referring Provider   Date 12/10/22    Referring Provider Dr. [name], MD      Oxygen   Maintain Oxygen Saturation 88% or higher      Treadmill   MPH 2.6    Grade 0.5    Minutes 15    METs 3.17      NuStep   Level 3   T6 Nustep   SPM 80    Minutes 15    METs 4.49      REL-XR   Level 4    Speed 50    Minutes 15    METs 4.49      Prescription Details   Frequency (times per week) 2    Duration Progress to 30 minutes of continuous aerobic without signs/symptoms of physical distress      Intensity   THRR 40-80% of Max Heartrate 119-151    Ratings of Perceived Exertion 11-13    Perceived Dyspnea 0-4      Progression   Progression Continue to progress workloads to maintain intensity without signs/symptoms of physical distress.      Resistance Training   Training Prescription Yes    Weight 5 lb    Reps 10-15             Perform Capillary Blood Glucose checks as needed.  Exercise Prescription Changes:   Exercise Prescription Changes     Row Name 12/10/22 1500  01/17/23 0800           Response to Exercise   Blood Pressure (Admit) 134/74 118/68      Blood Pressure (Exercise) 166/72 168/70      Blood Pressure (Exit) 148/72 124/62      Heart Rate (Admit) 88 bpm 84 bpm      Heart Rate (Exercise) 128 bpm 153 bpm      Heart Rate (Exit) 95 bpm 116 bpm      Oxygen Saturation (Admit) 100 % --      Oxygen Saturation (Exercise) 96 % --      Rating of Perceived Exertion (Exercise) 13 14      Perceived Dyspnea (Exercise) 1 --      Symptoms None none      Comments Results First two weeks of exercise      Duration -- Continue with 30 min of aerobic exercise without signs/symptoms of physical distress.      Intensity -- THRR unchanged        Progression   Progression -- Continue to progress workloads to maintain intensity without signs/symptoms of physical distress.      Average METs -- 4.83        Resistance Training   Training Prescription -- Yes      Weight -- 5 lb      Reps -- 10-15        Interval Training   Interval Training -- No        Treadmill   MPH -- 3      Grade -- 3      Minutes -- 15      METs -- 4.54  NuStep   Level -- 4      Minutes -- 15      METs -- 5.6        Elliptical   Level -- 4      Speed -- 3      Minutes -- 15        REL-XR   Level -- 5      Minutes -- 15      METs -- 6.1        Oxygen   Maintain Oxygen Saturation -- 88% or higher               Exercise Comments:   Exercise Comments     Row Name 01/02/23 1020           Exercise Comments First full day of exercise!  Patient was oriented to gym and equipment including functions, settings, policies, and procedures.  Patient's individual exercise prescription and treatment plan were reviewed.  All starting workloads were established based on the results of the 6 minute walk test done at initial orientation visit.  The plan for exercise progression was also introduced and progression will be customized based on patient's performance  and goals.                Exercise Goals and Review:   Exercise Goals     Row Name 12/10/22 1532             Exercise Goals   Increase Physical Activity Yes       Intervention Provide advice, education, support and counseling about physical activity/exercise needs.;Develop an individualized exercise prescription for aerobic and resistive training based on initial evaluation findings, risk stratification, comorbidities and participant's personal goals.       Expected Outcomes Short Term: Attend rehab on a regular basis to increase amount of physical activity.;Long Term: Add in home exercise to make exercise part of routine and to increase amount of physical activity.;Long Term: Exercising regularly at least 3-5 days a week.       Increase Strength and Stamina Yes       Intervention Provide advice, education, support and counseling about physical activity/exercise needs.;Develop an individualized exercise prescription for aerobic and resistive training based on initial evaluation findings, risk stratification, comorbidities and participant's personal goals.       Expected Outcomes Short Term: Increase workloads from initial exercise prescription for resistance, speed, and METs.;Short Term: Perform resistance training exercises routinely during rehab and add in resistance training at home;Long Term: Improve cardiorespiratory fitness, muscular endurance and strength as measured by increased METs and functional capacity ( )       Able to understand and use rate of perceived exertion (RPE) scale Yes       Intervention Provide education and explanation on how to use RPE scale       Expected Outcomes Short Term: Able to use RPE daily in rehab to express subjective intensity level;Long Term:  Able to use RPE to guide intensity level when exercising independently       Able to understand and use Dyspnea scale Yes       Intervention Provide education and explanation on how to use Dyspnea scale        Expected Outcomes Short Term: Able to use Dyspnea scale daily in rehab to express subjective sense of shortness of breath during exertion;Long Term: Able to use Dyspnea scale to guide intensity level when exercising independently       Knowledge and understanding  of Target Heart Rate Range (THRR) Yes       Intervention Provide education and explanation of THRR including how the numbers were predicted and where they are located for reference       Expected Outcomes Short Term: Able to state/look up THRR;Long Term: Able to use THRR to govern intensity when exercising independently;Short Term: Able to use daily as guideline for intensity in rehab       Able to check pulse independently Yes       Intervention Provide education and demonstration on how to check pulse in carotid and radial arteries.;Review the importance of being able to check your own pulse for safety during independent exercise       Expected Outcomes Short Term: Able to explain why pulse checking is important during independent exercise;Long Term: Able to check pulse independently and accurately       Understanding of Exercise Prescription Yes       Intervention Provide education, explanation, and written materials on patient's individual exercise prescription       Expected Outcomes Short Term: Able to explain program exercise prescription;Long Term: Able to explain home exercise prescription to exercise independently                Exercise Goals Re-Evaluation :  Exercise Goals Re-Evaluation     Row Name 01/02/23 1021 01/17/23 0844 01/23/23 0924         Exercise Goal Re-Evaluation   Exercise Goals Review Increase Physical Activity;Able to understand and use rate of perceived exertion (RPE) scale;Knowledge and understanding of Target Heart Rate Range (THRR);Understanding of Exercise Prescription;Increase Strength and Stamina;Able to check pulse independently Increase Physical Activity;Increase Strength and  Stamina;Understanding of Exercise Prescription Increase Physical Activity;Increase Strength and Stamina;Understanding of Exercise Prescription     Comments Reviewed RPE  and dyspnea scale, THR and program prescription with pt today.  Pt voiced understanding and was given a copy of goals to take home. Cassandra Robbins is off to a good start in the program. She was able to increase her treadmill workload to a speed of 3 mph with a 3% incline during her first two weeks of rehab. She also improved to level 4 on the elliptical and level 5 on the XR. We will continue to monitor her progress in the program. Cassandra Robbins states that she feels she is doing well with exercise. She reports progressively increasing her workloads on the treadmill and XR. She also states that she feels better after exercise, and has not felt as fatigued. She also states that she has been walking 20 minutes a day on a treadmill at home on the days when she is not coming to rehab as well. We will continue to monitor her progress.     Expected Outcomes Short: Use RPE daily to regulate intensity.  Long: Follow program prescription in THR. Short: Continue to progressively increase exercise workloads. Long: Continue exercise to improve strength and stamina. Short: Continue to walk on days away from rehab. Long: Continue to exercise independently.              Discharge Exercise Prescription (Final Exercise Prescription Changes):  Exercise Prescription Changes - 01/17/23 0800       Response to Exercise   Blood Pressure (Admit) 118/68    Blood Pressure (Exercise) 168/70    Blood Pressure (Exit) 124/62    Heart Rate (Admit) 84 bpm    Heart Rate (Exercise) 153 bpm    Heart Rate (Exit) 116 bpm  Rating of Perceived Exertion (Exercise) 14    Symptoms none    Comments First two weeks of exercise    Duration Continue with 30 min of aerobic exercise without signs/symptoms of physical distress.    Intensity THRR unchanged      Progression    Progression Continue to progress workloads to maintain intensity without signs/symptoms of physical distress.    Average METs 4.83      Resistance Training   Training Prescription Yes    Weight 5 lb    Reps 10-15      Interval Training   Interval Training No      Treadmill   MPH 3    Grade 3    Minutes 15    METs 4.54      NuStep   Level 4    Minutes 15    METs 5.6      Elliptical   Level 4    Speed 3    Minutes 15      REL-XR   Level 5    Minutes 15    METs 6.1      Oxygen   Maintain Oxygen Saturation 88% or higher             Nutrition:  Target Goals: Understanding of nutrition guidelines, daily intake of sodium 1500mg , cholesterol 200mg , calories 30% from fat and 7% or less from saturated fats, daily to have 5 or more servings of fruits and vegetables.  Education: All About Nutrition: -Group instruction provided by verbal, written material, interactive activities, discussions, models, and posters to present general guidelines for heart healthy nutrition including fat, fiber, MyPlate, the role of sodium in heart healthy nutrition, utilization of the nutrition label, and utilization of this knowledge for meal planning. Follow up email sent as well. Written material given at graduation.   Biometrics:  Pre Biometrics - 12/10/22 1533       Pre Biometrics   Height 5\' 11"  (1.803 m)    Weight 248 lb 9.6 oz (112.8 kg)    Waist Circumference 38.5 inches    Hip Circumference 49 inches    Waist to Hip Ratio 0.79 %    BMI (Calculated) 34.69    Single Leg Stand 30 seconds              Nutrition Therapy Plan and Nutrition Goals:  Nutrition Therapy & Goals - 12/10/22 1507       Nutrition Therapy   Diet Cardiac, low na    Protein (specify units) 90    Fiber 25 grams    Whole Grain Foods 3 servings    Saturated Fats 15 max. grams    Fruits and Vegetables 5 servings/day    Sodium 2 grams      Personal Nutrition Goals   Nutrition Goal Eat a complex  carb with every meal    Personal Goal #2 Be consistent with breakfast    Personal Goal #3 Continue to watch portions and eat slowly    Comments Patient drinking 90-120oz of water. Cup of coffee daily. She tries to eat 3 times per day. Before her stent she was often missing breakfast and then overeating at lunch/dinner. Says that now she has to take medication with food in the morning she is better with eating 3 meals per day. Set goal to continue to work on consistency and make these changes more permanent. Talked about choosing complex carbs and pairing wit protein to help stay full and stabilize blood sugars.  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 Tries to avoid it. Built out several meals and snacks with non-inflammatory foods. Encouraged plant based protein like beans, lentils, edemame, chickpeas, ect. These would be good non gluten based complex carbs. She likes fruit as well. Set goal to make sure she is eating a carb at each meal. Patient showed a lot of skill at identify shortcomings in her diet, spoke about setting goal to put into action changes like mindful slow eating to control portions.      Intervention Plan   Intervention Prescribe, educate and counsel regarding individualized specific dietary modifications aiming towards targeted core components such as weight, hypertension, lipid management, diabetes, heart failure and other comorbidities.;Nutrition handout(s) given to patient.    Expected Outcomes Short Term Goal: Understand basic principles of dietary content, such as calories, fat, sodium, cholesterol and nutrients.;Short Term Goal: A plan has been developed with personal nutrition goals set during dietitian appointment.;Long Term Goal: Adherence to prescribed nutrition plan.             Nutrition Assessments:  MEDIFICTS Score Key: >=70 Need to make dietary changes  40-70 Heart Healthy Diet <= 40 Therapeutic Level Cholesterol Diet  Flowsheet Row Cardiac  Rehab from 12/10/2022 in [facility] Cardiac and Pulmonary Rehab  Picture Your Plate Total Score on Admission 56      Picture Your Plate Scores: <40 Unhealthy dietary pattern with much room for improvement. 41-50 Dietary pattern unlikely to meet recommendations for good health and room for improvement. 51-60 More healthful dietary pattern, with some room for improvement.  >60 Healthy dietary pattern, although there may be some specific behaviors that could be improved.    Nutrition Goals Re-Evaluation:  Nutrition Goals Re-Evaluation     Row Name 01/23/23 0931             Goals   Comment Cassandra Robbins states that she is still working on the dietary guidelines she discussed with the RD. She reports doing well with portion control. She also states that she has been eating healthy, and occasionally enjoying movie theater popcorn. We will continue to monitor her nutrition.       Expected Outcome Short: Continue to work on portion control. Long: Practice heart-healthy eating patterns discussed with RD.                Nutrition Goals Discharge (Final Nutrition Goals Re-Evaluation):  Nutrition Goals Re-Evaluation - 01/23/23 0931       Goals   Comment Cassandra Robbins states that she is still working on the dietary guidelines she discussed with the RD. She reports doing well with portion control. She also states that she has been eating healthy, and occasionally enjoying movie theater popcorn. We will continue to monitor her nutrition.    Expected Outcome Short: Continue to work on portion control. Long: Practice heart-healthy eating patterns discussed with RD.             Psychosocial: Target Goals: Acknowledge presence or absence of significant depression and/or stress, maximize coping skills, provide positive support system. Participant is able to verbalize types and ability to use techniques and skills needed for reducing stress and depression.   Education: Stress, Anxiety, and Depression -  Group verbal and visual presentation to define topics covered.  Reviews how body is impacted by stress, anxiety, and depression.  Also discusses healthy ways to reduce stress and to treat/manage anxiety and depression.  Written material given at graduation.   Education: Sleep Hygiene -Provides group verbal  and written instruction about how sleep can affect your health.  Define sleep hygiene, discuss sleep cycles and impact of sleep habits. Review good sleep hygiene tips.    Initial Review & Psychosocial Screening:  Initial Psych Review & Screening - 11/26/22 1614       Initial Review   Current issues with Current Depression;History of Depression      Family Dynamics   Good Support System? Yes   Lives with life partner of 12 yrs; great support per pt     Screening Interventions   Expected Outcomes Short Term goal: Utilizing psychosocial counselor, staff and physician to assist with identification of specific Stressors or current issues interfering with healing process. Setting desired goal for each stressor or current issue identified.;Long Term Goal: Stressors or current issues are controlled or eliminated.;Short Term goal: Identification and review with participant of any Quality of Life or Depression concerns found by scoring the questionnaire.;Long Term goal: The participant improves quality of Life and PHQ9 Scores as seen by post scores and/or verbalization of changes             Quality of Life Scores:   Quality of Life - 12/10/22 1556       Quality of Life   Select Quality of Life      Quality of Life Scores   Health/Function Pre 26.37 %    Socioeconomic Pre 26.25 %    Psych/Spiritual Pre 25.71 %    Family Pre 26.3 %    GLOBAL Pre 26.2 %            Scores of 19 and below usually indicate a poorer quality of life in these areas.  A difference of  2-3 points is a clinically meaningful difference.  A difference of 2-3 points in the total score of the Quality of Life  Index has been associated with significant improvement in overall quality of life, self-image, physical symptoms, and general health in studies assessing change in quality of life.  PHQ-9: Review Flowsheet  More data exists      01/17/2023 12/10/2022 10/02/2022 08/06/2022 07/09/2022  Depression screen PHQ 2/9  Decreased Interest 1 0 1 1 1   Down, Depressed, Hopeless 1 0 1 1 1   PHQ - 2 Score 2 0 2 2 2   Altered sleeping 0 0 0 2 1  Tired, decreased energy 0 0 1 2 3   Change in appetite 1 0 0 2 2  Feeling bad or failure about yourself  0 0 0 1 1  Trouble concentrating 0 0 0 1 1  Moving slowly or fidgety/restless 0 0 0 1 1  Suicidal thoughts 0 0 0 1 0  PHQ-9 Score 3 0 3 12 11   Difficult doing work/chores Not difficult at all - Not difficult at all Somewhat difficult Somewhat difficult    Details           Interpretation of Total Score  Total Score Depression Severity:  1-4 = Minimal depression, 5-9 = Mild depression, 10-14 = Moderate depression, 15-19 = Moderately severe depression, 20-27 = Severe depression   Psychosocial Evaluation and Intervention:  Psychosocial Evaluation - 11/26/22 1617       Psychosocial Evaluation & Interventions   Interventions Encouraged to exercise with the program and follow exercise prescription;Relaxation education;Stress management education    Comments Cassandra Robbins is coming to cardiac rehab post cardiac stent. She is feeling back to baseline. 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 [place] has a  great support system at home. She has chronic pain secondary to autoimmune disorder, however, she says this should not interfere with exercise. She does have a h/o depression and discussed her managament techniques as she has dealt with for years. She is looking for accountability by coming to rehab. Cassandra Robbins has no barriers to attending the program and is ready to begin rehab.    Expected Outcomes Short: Attend cardiac rehab for education and  exercise. Long: Develop and maintain positive self care habits.    Continue Psychosocial Services  Follow up required by staff             Psychosocial Re-Evaluation:  Psychosocial Re-Evaluation     Row Name 01/23/23 0927             Psychosocial Re-Evaluation   Current issues with Current Stress Concerns       Comments Cassandra Robbins states no major stress concerns at this time. She states that she feels she is good at managing the daily stressors in life. She also reports that exercise has helped her with stress relief and makes her feel better. She also enjoys traveling and getting together with her friends for dinner. She states that she has a great support system around her and is sleeping well.       Expected Outcomes Short: Continue to exercise for mental boost. Long: Continue to maintain postive outlook.       Interventions Encouraged to attend Cardiac Rehabilitation for the exercise       Continue Psychosocial Services  Follow up required by staff                Psychosocial Discharge (Final Psychosocial Re-Evaluation):  Psychosocial Re-Evaluation - 01/23/23 0927       Psychosocial Re-Evaluation   Current issues with Current Stress Concerns    Comments Cassandra Robbins states no major stress concerns at this time. She states that she feels she is good at managing the daily stressors in life. She also reports that exercise has helped her with stress relief and makes her feel better. She also enjoys traveling and getting together with her friends for dinner. She states that she has a great support system around her and is sleeping well.    Expected Outcomes Short: Continue to exercise for mental boost. Long: Continue to maintain postive outlook.    Interventions Encouraged to attend Cardiac Rehabilitation for the exercise    Continue Psychosocial Services  Follow up required by staff             Vocational Rehabilitation: Provide vocational rehab assistance to qualifying  candidates.   Vocational Rehab Evaluation & Intervention:   Education: Education Goals: Education classes will be provided on a variety of topics geared toward better understanding of heart health and risk factor modification. Participant will state understanding/return demonstration of topics presented as noted by education test scores.  Learning Barriers/Preferences:  Learning Barriers/Preferences - 11/26/22 1616       Learning Barriers/Preferences   Learning Barriers None    Learning Preferences None             General Cardiac Education Topics:  AED/CPR: - Group verbal and written instruction with the use of models to demonstrate the basic use of the AED with the basic ABC's of resuscitation.   Anatomy and Cardiac Procedures: - Group verbal and visual presentation and models provide information about basic cardiac anatomy and function. Reviews the testing methods done to diagnose heart disease and the outcomes of the  test results. Describes the treatment choices: Medical Management, Angioplasty, or Coronary Bypass Surgery for treating various heart conditions including Myocardial Infarction, Angina, Valve Disease, and Cardiac Arrhythmias.  Written material given at graduation.   Medication Safety: - Group verbal and visual instruction to review commonly prescribed medications for heart and lung disease. Reviews the medication, class of the drug, and side effects. Includes the steps to properly store meds and maintain the prescription regimen.  Written material given at graduation.   Intimacy: - Group verbal instruction through game format to discuss how heart and lung disease can affect sexual intimacy. Written material given at graduation..   Know Your Numbers and Heart Failure: - Group verbal and visual instruction to discuss disease risk factors for cardiac and pulmonary disease and treatment options.  Reviews associated critical values for Overweight/Obesity,  Hypertension, Cholesterol, and Diabetes.  Discusses basics of heart failure: signs/symptoms and treatments.  Introduces Heart Failure Zone chart for action plan for heart failure.  Written material given at graduation.   Infection Prevention: - Provides verbal and written material to individual with discussion of infection control including proper hand washing and proper equipment cleaning during exercise session. Flowsheet Row Cardiac Rehab from 12/10/2022 in [facility] Cardiac and Pulmonary Rehab  Date 12/10/22  Educator NT  Instruction Review Code 1- Verbalizes Understanding       Falls Prevention: - Provides verbal and written material to individual with discussion of falls prevention and safety. Flowsheet Row Cardiac Rehab from 12/10/2022 in [facility] Cardiac and Pulmonary Rehab  Date 12/10/22  Educator NT  Instruction Review Code 1- Verbalizes Understanding       Other: -Provides group and verbal instruction on various topics (see comments)   Knowledge Questionnaire Score:  Knowledge Questionnaire Score - 12/10/22 1556       Knowledge Questionnaire Score   Pre Score 25/26             Core Components/Risk Factors/Patient Goals at Admission:  Personal Goals and Risk Factors at Admission - 12/10/22 1609       Core Components/Risk Factors/Patient Goals on Admission    Weight Management Yes    Intervention Weight Management: Develop a combined nutrition and exercise program designed to reach desired caloric intake, while maintaining appropriate intake of nutrient and fiber, sodium and fats, and appropriate energy expenditure required for the weight goal.;Weight Management: Provide education and appropriate resources to help participant work on and attain dietary goals.;Weight Management/Obesity: Establish reasonable short term and long term weight goals.    Admit Weight 248 lb 9.6 oz (112.8 kg)    Goal Weight: Short Term 245 lb (111.1 kg)    Goal Weight: Long Term 240 lb (108.9  kg)    Expected Outcomes Short Term: Continue to assess and modify interventions until short term weight is achieved;Long Term: Adherence to nutrition and physical activity/exercise program aimed toward attainment of established weight goal;Weight Loss: Understanding of general recommendations for a balanced deficit meal plan, which promotes 1-2 lb weight loss per week and includes a negative energy balance of [phone] kcal/d;Weight Maintenance: Understanding of the daily nutrition guidelines, which includes 25-35% calories from fat, 7% or less cal from saturated fats, less than 200mg  cholesterol, less than 1.5gm of sodium, & 5 or more servings of fruits and vegetables daily;Understanding recommendations for meals to include 15-35% energy as protein, 25-35% energy from fat, 35-60% energy from carbohydrates, less than 200mg  of dietary cholesterol, 20-35 gm of total fiber daily;Understanding of distribution of calorie intake throughout the day  with the consumption of 4-5 meals/snacks    Lipids Yes    Intervention Provide education and support for participant on nutrition & aerobic/resistive exercise along with prescribed medications to achieve LDL 70mg , HDL >40mg .    Expected Outcomes Short Term: Participant states understanding of desired cholesterol values and is compliant with medications prescribed. Participant is following exercise prescription and nutrition guidelines.;Long Term: Cholesterol controlled with medications as prescribed, with individualized exercise RX and with personalized nutrition plan. Value goals: LDL < 70mg , HDL > 40 mg.             Education:Diabetes - Individual verbal and written instruction to review signs/symptoms of diabetes, desired ranges of glucose level fasting, after meals and with exercise. Acknowledge that pre and post exercise glucose checks will be done for 3 sessions at entry of program.   Core Components/Risk Factors/Patient Goals Review:   Goals and Risk  Factor Review     Row Name 01/23/23 0936             Core Components/Risk Factors/Patient Goals Review   Personal Goals Review Weight Management/Obesity;Lipids       Review Cassandra Robbins states that she would like to lose a little weight but her main goal is wanting to feel comfortable. She weighed in today at 249.2 lb, and states that she would not mind getting down to 240 lb. She has been exercising regularly and eating healthy to maintain her weight. She also reports taking all her medications as prescribed.       Expected Outcomes Short: Continue to work towards weight goal. Long: Continue to manage lifestyle risk factors.                Core Components/Risk Factors/Patient Goals at Discharge (Final Review):   Goals and Risk Factor Review - 01/23/23 0936       Core Components/Risk Factors/Patient Goals Review   Personal Goals Review Weight Management/Obesity;Lipids    Review Cassandra Robbins states that she would like to lose a little weight but her main goal is wanting to feel comfortable. She weighed in today at 249.2 lb, and states that she would not mind getting down to 240 lb. She has been exercising regularly and eating healthy to maintain her weight. She also reports taking all her medications as prescribed.    Expected Outcomes Short: Continue to work towards weight goal. Long: Continue to manage lifestyle risk factors.             ITP Comments:  ITP Comments     Row Name 11/26/22 1610 12/10/22 1525 01/02/23 0939 01/02/23 1020 01/30/23 1009   ITP Comments Initial phone call completed. Dx can be found in [facility] 6/21. EP orientation scheduled for 8/12 at 9:00 am. Completed and gym orientation. Initial ITP created and sent for review to Dr. Bethann Punches, Medical Director. 30 Day review completed. Medical Director ITP review done, changes made as directed, and signed approval by Medical Director.    new to program First full day of exercise!  Patient was oriented to gym and equipment  including functions, settings, policies, and procedures.  Patient's individual exercise prescription and treatment plan were reviewed.  All starting workloads were established based on the results of the 6 minute walk test done at initial orientation visit.  The plan for exercise progression was also introduced and progression will be customized based on patient's performance and goals. 30 Day review completed. Medical Director ITP review done, changes made as directed, and signed approval by Medical  Director.    new to program            Comments:

## 2023-01-30 NOTE — Progress Notes (Signed)
Daily Session Note  Patient Details  Name: Cassandra Robbins MRN: [id number] Date of Birth: 1970/07/03 Referring Provider:   Flowsheet Row Cardiac Rehab from 12/10/2022 in [facility] Cardiac and Pulmonary Rehab  Referring Provider Dr. [name], MD       Encounter Date: 01/30/2023  Check In:  Session Check In - 01/30/23 0921       Check-In   Supervising physician immediately available to respond to emergencies See telemetry face sheet for immediately available ER MD    Location [facility]-Cardiac & Pulmonary Rehab    Staff Present Rory Percy, MS, Exercise Physiologist;Joseph Shelbie Proctor, RN, ADN    Virtual Visit No    Medication changes reported     No    Fall or balance concerns reported    No    Warm-up and Cool-down Performed on first and last piece of equipment    Resistance Training Performed Yes    VAD Patient? No    PAD/SET Patient? No      Pain Assessment   Currently in Pain? No/denies                Social History   Tobacco Use  Smoking Status Never   Passive exposure: Never  Smokeless Tobacco Never    Goals Met:  Independence with exercise equipment Exercise tolerated well No report of concerns or symptoms today Strength training completed today  Goals Unmet:  Not Applicable  Comments: Pt able to follow exercise prescription today without complaint.  Will continue to monitor for progression.    Dr. Bethann Punches is Medical Director for [facility] Cardiac Rehabilitation.  Dr. Vida Rigger is Medical Director for [facility] Pulmonary Rehabilitation.

## 2023-02-01 ENCOUNTER — Encounter: Payer: 59 | Admitting: *Deleted

## 2023-02-01 DIAGNOSIS — Z48812 Encounter for surgical aftercare following surgery on the circulatory system: Secondary | ICD-10-CM | POA: Diagnosis not present

## 2023-02-01 DIAGNOSIS — Z955 Presence of coronary angioplasty implant and graft: Secondary | ICD-10-CM

## 2023-02-01 NOTE — Progress Notes (Signed)
Daily Session Note  Patient Details  Name: Cassandra Robbins MRN: [id number] Date of Birth: 01-30-1971 Referring Provider:   Flowsheet Row Cardiac Rehab from 12/10/2022 in [facility] Cardiac and Pulmonary Rehab  Referring Provider Dr. [name], MD       Encounter Date: 02/01/2023  Check In:  Session Check In - 02/01/23 1001       Check-In   Supervising physician immediately available to respond to emergencies See telemetry face sheet for immediately available ER MD    Location [facility]-Cardiac & Pulmonary Rehab    Staff Present Elige Ko, RCP,RRT,BSRT;Noah Tickle, BS, Exercise Physiologist;Lennart Gladish, RN, BSN, CCRP    Virtual Visit No    Medication changes reported     No    Fall or balance concerns reported    No    Warm-up and Cool-down Performed on first and last piece of equipment    Resistance Training Performed Yes    VAD Patient? No    PAD/SET Patient? No      Pain Assessment   Currently in Pain? No/denies                Social History   Tobacco Use  Smoking Status Never   Passive exposure: Never  Smokeless Tobacco Never    Goals Met:  Independence with exercise equipment Exercise tolerated well No report of concerns or symptoms today  Goals Unmet:  Not Applicable  Comments: Pt able to follow exercise prescription today without complaint.  Will continue to monitor for progression.    Dr. Bethann Punches is Medical Director for [facility] Cardiac Rehabilitation.  Dr. Vida Rigger is Medical Director for [facility] Pulmonary Rehabilitation.

## 2023-02-01 NOTE — Progress Notes (Unsigned)
Cardiology Office Note    Date:  02/04/2023   ID:  [name], DOB 07-05-1970, MRN [id number]  PCP:  Cassandra Carrow, DO  Cardiologist:  Cassandra Lemma, MD  Electrophysiologist:  None   Chief Complaint: Follow up  History of Present Illness:   Cassandra Robbins is a 52 y.o. female with history of CAD status post PCI/DES to the RCA and LPAV, HLD, and stills disease who presents for follow-up of his CAD.  She was evaluated by Dr. Herbie Robbins in 08/2022 for shortness of breath and stridor following URI in early 2024.  Subsequent coronary CTA in 09/2022 showed a calcium score of 0.  There was noncalcified plaque in the RCA estimated at 50 to 69% with a ctFFR of 0.72.  Echo in 09/2022 showed an EF of 55 to 60%, no regional wall motion normalities, normal LV diastolic function parameters, normal RV systolic function and ventricular cavity size, mild mitral regurgitation, and an estimated right atrial pressure of 3 mmHg.  LHC on 10/19/2022 showed severe two-vessel CAD involving the mid RCA with 80% stenosis in proximal LPAV with 80% stenosis with successful PCI/DES to the proximal to mid RCA and LPAV.  There was nonobstructive disease involving the proximal LAD of 40% and has P1/D1 takeoff, as well as 20 to 30% mid RCA stenosis just distal to the significant lesion.  Normal LVEDP.  Procedure was complicated by significant coronary spasm involving the LAD resolved with intracoronary nitroglycerin and additional sedation, with postprocedure complicated by headache and visual changes with CTA head nonacute.  Neurology felt the patient had nitroglycerin or spasm induced migraine.  She was last seen in the office in 10/2022 and was without symptoms of angina or cardiac decompensation.  She comes in doing well from a cardiac perspective and is without symptoms of angina or cardiac decompensation.  She has noted an occasional sensation in her throat is similar to what she experienced leading up to her PCI, though not  as severe or exactly similar.  Adherent to DAPT.  No falls or symptoms concerning for bleeding.  Taking rosuvastatin twice weekly and continues to note myalgias and nausea.  Also previously intolerant to atorvastatin.  Working with cardiac rehab.   Labs independently reviewed: 11/2022 - TC 145, TG 109, HDL 49, LDL 74, direct LDL 86, albumin 4.0, AST/ALT normal 09/2022 - Hgb 12.2, PLT 261, potassium 4.2, BUN 12, serum creatinine 0.75 12/2021 - A1c 6.1 12/2020 - TSH normal  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HCC)    Toe fracture, right 10/08/2017    Past Surgical History:  Procedure Laterality Date   COLONOSCOPY WITH PROPOFOL N/A 02/21/2022   Procedure: COLONOSCOPY WITH PROPOFOL;  Surgeon: Cassandra Reil, MD;  Location: [facility] ENDOSCOPY;  Service: Gastroenterology;  Laterality: N/A;   CORONARY STENT INTERVENTION N/A 10/19/2022   Procedure: CORONARY STENT INTERVENTION;  Surgeon: Cassandra Lex, MD;  Location: [facility] INVASIVE CV LAB;  Service: Cardiovascular;  Laterality: N/A;   LAPAROSCOPIC ENDOMETRIOSIS FULGURATION  2007   LEFT HEART CATH AND CORONARY ANGIOGRAPHY N/A 10/19/2022   Procedure: LEFT HEART CATH AND CORONARY ANGIOGRAPHY;  Surgeon: Cassandra Lex, MD;  Location: [facility] INVASIVE CV LAB;  Service: Cardiovascular;  Laterality: N/A;   SEPTOPLASTY  2005  TUBAL LIGATION      Current Medications: Current Meds  Medication Sig   acetaminophen (TYLENOL) 500 MG tablet Take 1,000 mg by mouth every 8 (eight) hours as needed for moderate pain.   amLODipine (NORVASC) 2.5 MG tablet Take 1 tablet (2.5 mg total) by mouth daily.   amphetamine-dextroamphetamine (ADDERALL) 20 MG tablet Take 1 tablet (20 mg total) by mouth 2 (two) times daily.   aspirin EC 81 MG tablet Take 1 tablet (81 mg total) by mouth daily.  Swallow whole.   cetirizine (ZYRTEC) 10 MG tablet Take 10 mg by mouth daily.   clopidogrel (PLAVIX) 75 MG tablet Take 1 tablet (75 mg total) by mouth daily with breakfast.   Evolocumab (REPATHA SURECLICK) 140 MG/ML SOAJ Inject 140 mg into the skin every 14 (fourteen) days.   fluticasone (FLONASE) 50 MCG/ACT nasal spray Place 2 sprays into both nostrils daily.   ibuprofen (ADVIL) 200 MG tablet Take 600 mg by mouth every 8 (eight) hours as needed for moderate pain.   ketoconazole (NIZORAL) 2 % cream Apply 1 Application topically daily. (Patient taking differently: Apply 1 Application topically daily as needed for irritation.)   nitroGLYCERIN (NITROSTAT) 0.4 MG SL tablet Place 1 tablet (0.4 mg total) under the tongue every 5 (five) minutes as needed for chest pain.   pantoprazole (PROTONIX) 40 MG tablet Take 1 tablet (40 mg total) by mouth daily.   sodium chloride (OCEAN) 0.65 % SOLN nasal spray Place 1 spray into both nostrils as needed for congestion.    Allergies:   Penicillins, Amoxicillin, and Morphine   Social History   Socioeconomic History   Marital status: Divorced    Spouse name: Not on file   Number of children: Not on file   Years of education: Not on file   Highest education level: Associate degree: academic program  Occupational History   Occupation: paramedic  Tobacco Use   Smoking status: Never    Passive exposure: Never   Smokeless tobacco: Never  Vaping Use   Vaping status: Never Used  Substance and Sexual Activity   Alcohol use: Yes    Alcohol/week: 2.0 - 4.0 standard drinks of alcohol    Types: 1 - 2 Glasses of wine, 1 - 2 Shots of liquor per week    Comment: on occasion   Drug use: No   Sexual activity: Yes    Birth control/protection: Other-see comments    Comment: Tibal ligation  Other Topics Concern   Not on file  Social History Narrative   She has been a paramedic for 32 years.     Social Determinants of Health   Financial Resource Strain: Low Risk   (08/02/2022)   Overall Financial Resource Strain (CARDIA)    Difficulty of Paying Living Expenses: Not hard at all  Food Insecurity: No Food Insecurity (08/02/2022)   Hunger Vital Sign    Worried About Running Out of Food in the Last Year: Never true    Ran Out of Food in the Last Year: Never true  Transportation Needs: No Transportation Needs (08/02/2022)   PRAPARE - Administrator, Civil Service (Medical): No    Lack of Transportation (Non-Medical): No  Physical Activity: Unknown (08/02/2022)   Exercise Vital Sign    Days of Exercise per Week: 0 days    Minutes of Exercise per Session: Not on file  Stress: Stress Concern Present (08/02/2022)   [employer] of Occupational Health - Occupational Stress Questionnaire    Feeling of  Stress : To some extent  Social Connections: Moderately Integrated (08/02/2022)   Social Connection and Isolation Panel [NHANES]    Frequency of Communication with Friends and Family: More than three times a week    Frequency of Social Gatherings with Friends and Family: Three times a week    Attends Religious Services: More than 4 times per year    Active Member of Clubs or Organizations: Yes    Attends Engineer, structural: More than 4 times per year    Marital Status: Divorced     Family History:  The patient's family history includes Asthma in her mother; Cancer (age of onset: 71) in her father; Lung disease in her mother; Lymphoma in her sister; Pulmonary fibrosis in her maternal grandfather; Rheum arthritis in her sister; Thyroid disease in her mother. There is no history of Breast cancer.  ROS:   12-point review of systems is negative unless otherwise noted in the HPI.   EKGs/Labs/Other Studies Reviewed:    Studies reviewed were summarized above. The additional studies were reviewed today:  LHC 10/19/2022:   Prox LAD to Mid LAD lesion is 40% stenosed. => Notable spasm after PCI, relieved with nitroglycerin.    ---------------------------------------------------   Prox RCA to Mid RCA lesion is 80% stenosed.   A drug-eluting stent was successfully placed using a SYNERGY XD 3.50X16 => postdilated to 3.8 mm. Post intervention, there is a 0% residual stenosis.TIMI-3 flow pre and post   ---------------------------------------------------   LPAV lesion is 80% stenosed with 80% stenosed side branch in 2nd LPL.   Score score flex angioplasty performed followed by successful placement of a A drug-eluting stent using a SYNERGY XD 3.0X20.  Postdilated to 3.6 mm; Post intervention, there is a 0% residual stenosis -> going from LPA V into LPL 2; TIMI-3 flow pre and post   Mid RCA lesion is 30% stenosed.   ----------------------------------------------------   LV end diastolic pressure is normal.   There is no aortic valve stenosis.   ---------------------------------------------------   Recommend uninterrupted dual antiplatelet therapy with Aspirin 81mg  daily and Clopidogrel 75mg  daily for a minimum of 6 months (stable ischemic heart disease-Class I recommendation).   Will reduce to maintenance Plavix monotherapy 75 mg daily to complete additional 18 months.  Can be held for procedures after 6 months.   POST-CATH DIAGNOSES Severe two-vessel CAD involving mid RCA 80% stenosis and proximal (LPAV)-LPL-2 80% stenosis both were by FFR ct\ Successful DES PCI of proximal to mid RCA a 80% stenosis reduced to 0% with Synergy DES 3.5 mm x 16 mm (postdilated to 3.8 mm). TIMI-3 flow pre and post. Successful DES PCI of LPAV/LPL 2 reducing 80% to 0% with Synergy DES 3.0 mm by 20 mm (postdilated to 3.6 mm.). TIMI-3 flow pre and post Proximal LAD 40% stenosis at SP1/D1 takeoff, also 20 to 30% mid RCA just distal to the significant lesion. Normal LVEDP Significant coronary spasm after post dilation of LCx lesion-spasm of the proximal LAD for percent stenosis resolved with IC and additional sedation.  She did have significant  discomfort with this both the throat discomfort which was her angina as well as true substernal chest pressure. This resolved after nitroglycerin and additional sedation; it also coincided with brachial/radial arterial spasm on the guide catheter. Postprocedure patient had mild headache with visual after maladies. Code stroke called, neurology suspected nitroglycerin or spasm induced migraine headache but agreed with CT of head.      RECOMMENDATIONS Based on the 10 spasm, plan  is to treat with IV NTG, however may change to verapamil per neurology. CT scan of the head to evaluate visual symptoms. Will monitor overnight outpatient with extended recovery. __________  2D echo 10/17/2022: 1. Left ventricular ejection fraction, by estimation, is 55 to 60%. Left  ventricular ejection fraction by 2D MOD biplane is 56.9 %. The left  ventricle has normal function. The left ventricle has no regional wall  motion abnormalities. Left ventricular  diastolic parameters were normal. The average left ventricular global  longitudinal strain is -22.2 %. The global longitudinal strain is normal.   2. Right ventricular systolic function is normal. The right ventricular  size is normal.   3. The mitral valve is normal in structure. Mild mitral valve  regurgitation.   4. The aortic valve was not well visualized. Aortic valve regurgitation  is not visualized.   5. The inferior vena cava is normal in size with greater than 50%  respiratory variability, suggesting right atrial pressure of 3 mmHg.  ___________  Coronary CTA 10/08/2022: FINDINGS: Aorta:  Normal size.  No calcifications.  No dissection.   Aortic Valve:  Trileaflet.  No calcifications.   Coronary Arteries:  Normal coronary origin.  Right dominance.   RCA is a dominant artery. There is non calcified plaque proximally causing moderate stenosis (50-69%).   Left main gives rise to LAD and LCX arteries. LM has no disease.   LAD has no plaque.    LCX is a non-dominant artery.  There is no plaque.   Other findings:   Normal pulmonary vein drainage into the left atrium.   Normal left atrial appendage without a thrombus.   Normal size of the pulmonary artery.   IMPRESSION: 1. Coronary calcium score of 0. 2. Normal coronary origin with right dominance. 3. Non calcified plaque in the proximal RCA causing moderate stenosis (50-69%). 4. CAD-RADS 3. Moderate stenosis. Consider symptom-guided anti-ischemic pharmacotherapy as well as risk factor modification per guideline directed care. 5. Additional analysis with CT FFR will be submitted and reported separately.  ctFFR: 1. Left Main:  No significant stenosis. 2. LAD: No significant stenosis.  FFRct 0.92 3. LCX: No significant stenosis.  FFRct 0.98 4. RCA: significant stenosis in the proximal RCA.  FFRct 0.72   IMPRESSION: 1. CT FFR analysis showed significant stenosis in the proximal RCA. FFRct 0.72 2.  Recommend cardiac catheterization.   EKG:  EKG is not ordered today.   Recent Labs: 10/02/2022: BNP 3.8 10/20/2022: BUN 12; Creatinine, Ser 0.75; Hemoglobin 12.2; Platelets 261; Potassium 4.2; Sodium 138 12/17/2022: ALT 21  Recent Lipid Panel    Component Value Date/Time   CHOL 145 12/17/2022 1153   CHOL 225 (H) 01/08/2022 1124   TRIG 109 12/17/2022 1153   HDL 49 12/17/2022 1153   HDL 57 01/08/2022 1124   CHOLHDL 3.0 12/17/2022 1153   VLDL 22 12/17/2022 1153   LDLCALC 74 12/17/2022 1153   LDLCALC 143 (H) 01/08/2022 1124   LDLDIRECT 86 12/17/2022 1153    PHYSICAL EXAM:    VS:  BP 130/68 (BP Location: Left Arm, Patient Position: Sitting, Cuff Size: Large)   Pulse 79   Resp 18   Ht 5\' 10"  (1.778 m)   Wt 248 lb (112.5 kg)   BMI 35.58 kg/m   BMI: Body mass index is 35.58 kg/m.  Physical Exam Vitals reviewed.  Constitutional:      Appearance: She is well-developed.  HENT:     Head: Normocephalic and atraumatic.  Eyes:  General:        Right eye: No  discharge.        Left eye: No discharge.  Cardiovascular:     Rate and Rhythm: Normal rate and regular rhythm.     Heart sounds: Normal heart sounds, S1 normal and S2 normal. Heart sounds not distant. No midsystolic click and no opening snap. No murmur heard.    No friction rub.  Pulmonary:     Effort: Pulmonary effort is normal. No respiratory distress.     Breath sounds: Normal breath sounds. No decreased breath sounds, wheezing or rales.  Chest:     Chest wall: No tenderness.  Abdominal:     General: There is no distension.  Musculoskeletal:     Cervical back: Normal range of motion.  Skin:    General: Skin is warm and dry.     Nails: There is no clubbing.  Neurological:     Mental Status: She is alert and oriented to person, place, and time.  Psychiatric:        Speech: Speech normal.        Behavior: Behavior normal.        Thought Content: Thought content normal.        Judgment: Judgment normal.     Wt Readings from Last 3 Encounters:  02/04/23 248 lb (112.5 kg)  01/17/23 249 lb 12.8 oz (113.3 kg)  01/10/23 249 lb 12.8 oz (113.3 kg)     ASSESSMENT & PLAN:   CAD involving native coronary arteries without angina: She is doing well and without symptoms concerning for angina or cardiac decompensation.  Recommend uninterrupted DAPT with aspirin 81 mg daily and clopidogrel 75 mg daily for 6 months (04/20/2023) followed by reduction to Plavix monotherapy for additional 18 months.  Query if she is having episodes of vasospasm leading to similar sensation of needing to clear her throat.  Trial of amlodipine 2.5 mg.  Transitioning from high intensity statin to PCSK9 inhibitor as outlined below given intolerance to statin therapy.  Participating with cardiac rehab.  No indication for further ischemic testing at this time.  HLD with statin intolerance: LDL 74 in 11/2022 with target LDL less than 70, this is improved from an LDL of 160 in 09/2022.  Intolerant to atorvastatin and  rosuvastatin, even at twice weekly dosing.  Stop rosuvastatin.  Start Repatha 140 mg every 2 weeks.  Recheck fasting lipid panel in 2 months.    Disposition: F/u with Dr. Herbie Robbins or an APP in 1 month.   Medication Adjustments/Labs and Tests Ordered: Current medicines are reviewed at length with the patient today.  Concerns regarding medicines are outlined above. Medication changes, Labs and Tests ordered today are summarized above and listed in the Patient Instructions accessible in Encounters.   Signed, Eula Listen, PA-C 02/04/2023 12:13 PM     [facility] [place] [address] [place], [place] [address] [phone]

## 2023-02-04 ENCOUNTER — Ambulatory Visit: Payer: 59 | Attending: Physician Assistant | Admitting: Physician Assistant

## 2023-02-04 ENCOUNTER — Encounter: Payer: Self-pay | Admitting: Physician Assistant

## 2023-02-04 VITALS — BP 130/68 | HR 79 | Resp 18 | Ht 70.0 in | Wt 248.0 lb

## 2023-02-04 DIAGNOSIS — Z789 Other specified health status: Secondary | ICD-10-CM | POA: Diagnosis not present

## 2023-02-04 DIAGNOSIS — E785 Hyperlipidemia, unspecified: Secondary | ICD-10-CM | POA: Diagnosis not present

## 2023-02-04 DIAGNOSIS — I2510 Atherosclerotic heart disease of native coronary artery without angina pectoris: Secondary | ICD-10-CM

## 2023-02-04 MED ORDER — AMLODIPINE BESYLATE 2.5 MG PO TABS: 2.5000 mg | ORAL_TABLET | Freq: Every day | ORAL | 3 refills | Status: DC

## 2023-02-04 MED ORDER — REPATHA SURECLICK 140 MG/ML ~~LOC~~ SOAJ: 140.0000 mg | SUBCUTANEOUS | 7 refills | Status: AC

## 2023-02-04 NOTE — Patient Instructions (Addendum)
Medication Instructions:  Your physician recommends the following medication changes.  STOP TAKING: Stop Crestor  START TAKING: Amlodipine 2.5 mg daily Repatha 140 mg (under the skin injection) once every 2 weeks   *If you need a refill on your cardiac medications before your next appointment, please call your pharmacy*   Lab Work: None ordered today.  If you have labs (blood work) drawn today and your tests are completely normal, you will receive your results only by: MyChart Message (if you have MyChart) OR A paper copy in the mail If you have any lab test that is abnormal or we need to change your treatment, we will call you to review the results.   Testing/Procedures: None ordered today.   Follow-Up: At [facility], you and your health needs are our priority.  As part of our continuing mission to provide you with exceptional heart care, we have created designated Provider Care Teams.  These Care Teams include your primary Cardiologist (physician) and Advanced Practice Providers (APPs -  Physician Assistants and Nurse Practitioners) who all work together to provide you with the care you need, when you need it.  We recommend signing up for the patient portal called "MyChart".  Sign up information is provided on this After Visit Summary.  MyChart is used to connect with patients for Virtual Visits (Telemedicine).  Patients are able to view lab/test results, encounter notes, upcoming appointments, etc.  Non-urgent messages can be sent to your provider as well.   To learn more about what you can do with MyChart, go to [email or web address].    Your next appointment:   1 month(s)  Provider:   You may see [name], MD or one of the following Advanced Practice Providers on your designated Care Team:   Nicolasa Ducking, NP Eula Listen, PA-C Cadence Fransico Michael, PA-C Charlsie Quest, NP

## 2023-02-06 ENCOUNTER — Encounter: Payer: 59 | Admitting: *Deleted

## 2023-02-06 ENCOUNTER — Other Ambulatory Visit (HOSPITAL_COMMUNITY): Payer: Self-pay

## 2023-02-06 ENCOUNTER — Telehealth: Payer: Self-pay | Admitting: Pharmacist

## 2023-02-06 NOTE — Telephone Encounter (Signed)
Pharmacy Patient Advocate Encounter   Received notification from CoverMyMeds that prior authorization for Repatha SureClick 140MG /ML auto-injectors is required/requested.   Insurance verification completed.   The patient is insured through [facility] .   Per test claim: PA required; PA submitted to [employer] [facility] via CoverMyMeds Key/confirmation #/[facility] Status is pending

## 2023-02-08 ENCOUNTER — Encounter: Payer: 59 | Admitting: *Deleted

## 2023-02-08 DIAGNOSIS — Z955 Presence of coronary angioplasty implant and graft: Secondary | ICD-10-CM

## 2023-02-08 DIAGNOSIS — Z48812 Encounter for surgical aftercare following surgery on the circulatory system: Secondary | ICD-10-CM | POA: Diagnosis not present

## 2023-02-08 NOTE — Telephone Encounter (Signed)
Patient did not tolerate statins, even at lower doses. Her baseline LDL-C is 160. She would not get to goal on just zetia. I would recommend you submit LDL from June of 160 along with chart notes from ryan. Explain clinical hx, intolerance to rosuvastatin twice a week and atorvastatin. Calculate her SAMS score and then argue that zetia would not get her close to her goal, therefore is a waste of therapy step. Would recommend you write a formal appeals letter and fax

## 2023-02-08 NOTE — Telephone Encounter (Signed)
Spoke with patient. Explained that Repatha was denied but we should be able to win appeal. SAMS-CI score is 10. Pain in knee and ankle within 4 weeks. Resolves in <2 weeks after stopping and restarts within 2 weeks of retrial. Appeals faxed

## 2023-02-08 NOTE — Telephone Encounter (Signed)
Per Dorathy Daft, CPhT, spoke with Efraim Kaufmann, [facility] and got confirmation she is working on appeal.

## 2023-02-08 NOTE — Telephone Encounter (Signed)
Pharmacy Patient Advocate Encounter  Received notification from [employer] that Prior Authorization for Repatha has been DENIED.  Full denial letter will be uploaded to the media tab. See denial reason below.  Per insurance, patient must try moderate to high intensity statin therapy in combination with ezetimbe for at least 3 months.  If patient can't tolerate statin therapy, must document SAMS-CI.

## 2023-02-08 NOTE — Progress Notes (Signed)
Daily Session Note  Patient Details  Name: Cassandra Robbins MRN: [id number] Date of Birth: Sep 27, 1970 Referring Provider:   Flowsheet Row Cardiac Rehab from 12/10/2022 in [facility] Cardiac and Pulmonary Rehab  Referring Provider Dr. [name], MD       Encounter Date: 02/08/2023  Check In:  Session Check In - 02/08/23 1027       Check-In   Supervising physician immediately available to respond to emergencies See telemetry face sheet for immediately available ER MD    Location [facility]-Cardiac & Pulmonary Rehab    Staff Present Rory Percy, MS, Exercise Physiologist;Revonda Menter Karleen Hampshire RN, BSN;Maxon Conetta BS, , Exercise Physiologist;Noah Tickle, BS, Exercise Physiologist    Virtual Visit No    Medication changes reported     Yes    Comments Patients states she has been started on Rapatha and Amlodipine; Discontinued from rosuvastatin    Fall or balance concerns reported    No    Warm-up and Cool-down Performed on first and last piece of equipment    Resistance Training Performed Yes    VAD Patient? No    PAD/SET Patient? No      Pain Assessment   Currently in Pain? No/denies                Social History   Tobacco Use  Smoking Status Never   Passive exposure: Never  Smokeless Tobacco Never    Goals Met:  Independence with exercise equipment Exercise tolerated well No report of concerns or symptoms today Strength training completed today  Goals Unmet:  Not Applicable  Comments: Pt able to follow exercise prescription today without complaint.  Will continue to monitor for progression.     Dr. Bethann Punches is Medical Director for [facility] Cardiac Rehabilitation.  Dr. Vida Rigger is Medical Director for [facility] Pulmonary Rehabilitation.

## 2023-02-11 ENCOUNTER — Encounter: Payer: 59 | Admitting: *Deleted

## 2023-02-11 DIAGNOSIS — Z955 Presence of coronary angioplasty implant and graft: Secondary | ICD-10-CM | POA: Diagnosis not present

## 2023-02-11 DIAGNOSIS — Z48812 Encounter for surgical aftercare following surgery on the circulatory system: Secondary | ICD-10-CM | POA: Diagnosis not present

## 2023-02-11 NOTE — Progress Notes (Signed)
Daily Session Note  Patient Details  Name: Cassandra Robbins MRN: [id number] Date of Birth: 1970/09/07 Referring Provider:   Flowsheet Row Cardiac Rehab from 12/10/2022 in [facility] Cardiac and Pulmonary Rehab  Referring Provider Dr. [name], MD       Encounter Date: 02/11/2023  Check In:  Session Check In - 02/11/23 0930       Check-In   Supervising physician immediately available to respond to emergencies See telemetry face sheet for immediately available ER MD    Location [facility]-Cardiac & Pulmonary Rehab    Staff Present Rory Percy, MS, Exercise Physiologist;Maxon Suzzette Righter, , Exercise Physiologist;Kelly Madilyn Fireman, BS, ACSM CEP, Exercise Physiologist;Sarya Linenberger Katrinka Blazing, RN, ADN    Virtual Visit No    Medication changes reported     No    Fall or balance concerns reported    No    Warm-up and Cool-down Performed on first and last piece of equipment    Resistance Training Performed Yes    VAD Patient? No    PAD/SET Patient? No      Pain Assessment   Currently in Pain? No/denies                Social History   Tobacco Use  Smoking Status Never   Passive exposure: Never  Smokeless Tobacco Never    Goals Met:  Independence with exercise equipment Exercise tolerated well No report of concerns or symptoms today Strength training completed today  Goals Unmet:  Not Applicable  Comments: Pt able to follow exercise prescription today without complaint.  Will continue to monitor for progression.  Reviewed home exercise with pt today.  Pt plans to walk at home on home TM for exercise.  Reviewed THR, pulse, RPE, sign and symptoms, pulse oximetery and when to call 911 or MD.  Also discussed weather considerations and indoor options.  Pt voiced understanding.    Dr. Bethann Punches is Medical Director for [facility] Cardiac Rehabilitation.  Dr. Vida Rigger is Medical Director for [facility] Pulmonary Rehabilitation.

## 2023-02-13 ENCOUNTER — Encounter: Payer: 59 | Admitting: *Deleted

## 2023-02-15 ENCOUNTER — Encounter: Payer: 59 | Admitting: *Deleted

## 2023-02-15 DIAGNOSIS — Z955 Presence of coronary angioplasty implant and graft: Secondary | ICD-10-CM

## 2023-02-15 DIAGNOSIS — Z48812 Encounter for surgical aftercare following surgery on the circulatory system: Secondary | ICD-10-CM | POA: Diagnosis not present

## 2023-02-15 NOTE — Progress Notes (Signed)
Daily Session Note  Patient Details  Name: Cassandra Robbins MRN: [id number] Date of Birth: 1970-08-02 Referring Provider:   Flowsheet Row Cardiac Rehab from 12/10/2022 in [facility] Cardiac and Pulmonary Rehab  Referring Provider Dr. [name], MD       Encounter Date: 02/15/2023  Check In:  Session Check In - 02/15/23 0949       Check-In   Supervising physician immediately available to respond to emergencies See telemetry face sheet for immediately available ER MD    Location [facility]-Cardiac & Pulmonary Rehab    Staff Present Cora Collum, RN, BSN, CCRP;Joseph Hood, RCP,RRT,BSRT;Noah Tickle, [place], Exercise Physiologist    Virtual Visit No    Medication changes reported     No    Fall or balance concerns reported    No    Warm-up and Cool-down Performed on first and last piece of equipment    Resistance Training Performed Yes    VAD Patient? No    PAD/SET Patient? No      Pain Assessment   Currently in Pain? No/denies                Social History   Tobacco Use  Smoking Status Never   Passive exposure: Never  Smokeless Tobacco Never    Goals Met:  Independence with exercise equipment Exercise tolerated well No report of concerns or symptoms today  Goals Unmet:  Not Applicable  Comments: Pt able to follow exercise prescription today without complaint.  Will continue to monitor for progression.    Dr. Bethann Punches is Medical Director for [facility] Cardiac Rehabilitation.  Dr. Vida Rigger is Medical Director for [facility] Pulmonary Rehabilitation.

## 2023-02-18 ENCOUNTER — Encounter: Payer: 59 | Admitting: *Deleted

## 2023-02-18 DIAGNOSIS — Z955 Presence of coronary angioplasty implant and graft: Secondary | ICD-10-CM

## 2023-02-18 DIAGNOSIS — Z48812 Encounter for surgical aftercare following surgery on the circulatory system: Secondary | ICD-10-CM | POA: Diagnosis not present

## 2023-02-18 NOTE — Progress Notes (Signed)
Daily Session Note  Patient Details  Name: Cassandra Robbins MRN: [id number] Date of Birth: 24-Jul-1970 Referring Provider:   Flowsheet Row Cardiac Rehab from 12/10/2022 in [facility] Cardiac and Pulmonary Rehab  Referring Provider Dr. [name], MD       Encounter Date: 02/18/2023  Check In:  Session Check In - 02/18/23 0941       Check-In   Supervising physician immediately available to respond to emergencies See telemetry face sheet for immediately available ER MD    Location [facility]-Cardiac & Pulmonary Rehab    Staff Present Rory Percy, MS, Exercise Physiologist;Maxon Suzzette Righter, , Exercise Physiologist;Kelly Madilyn Fireman, BS, ACSM CEP, Exercise Physiologist;Luciana Cammarata, RN, BSN, CCRP    Virtual Visit No    Medication changes reported     No    Fall or balance concerns reported    No    Warm-up and Cool-down Performed on first and last piece of equipment    Resistance Training Performed Yes    VAD Patient? No    PAD/SET Patient? No      Pain Assessment   Currently in Pain? No/denies                Social History   Tobacco Use  Smoking Status Never   Passive exposure: Never  Smokeless Tobacco Never    Goals Met:  Independence with exercise equipment Exercise tolerated well No report of concerns or symptoms today  Goals Unmet:  Not Applicable  Comments: Pt able to follow exercise prescription today without complaint.  Will continue to monitor for progression.    Dr. Bethann Punches is Medical Director for [facility] Cardiac Rehabilitation.  Dr. Vida Rigger is Medical Director for [facility] Pulmonary Rehabilitation.

## 2023-02-22 ENCOUNTER — Encounter: Payer: 59 | Admitting: *Deleted

## 2023-02-22 DIAGNOSIS — Z955 Presence of coronary angioplasty implant and graft: Secondary | ICD-10-CM | POA: Diagnosis not present

## 2023-02-22 DIAGNOSIS — Z48812 Encounter for surgical aftercare following surgery on the circulatory system: Secondary | ICD-10-CM | POA: Diagnosis not present

## 2023-02-22 NOTE — Progress Notes (Signed)
Daily Session Note  Patient Details  Name: Cassandra Robbins MRN: [id number] Date of Birth: 1971/03/16 Referring Provider:   Flowsheet Row Cardiac Rehab from 12/10/2022 in [facility] Cardiac and Pulmonary Rehab  Referring Provider Dr. [name], MD       Encounter Date: 02/22/2023  Check In:  Session Check In - 02/22/23 0926       Check-In   Supervising physician immediately available to respond to emergencies See telemetry face sheet for immediately available ER MD    Location [facility]-Cardiac & Pulmonary Rehab    Staff Present Cora Collum, RN, BSN, CCRP;Noah Tickle, BS, Exercise Physiologist;Joseph [place], [place]    Virtual Visit No    Medication changes reported     No    Fall or balance concerns reported    No    Warm-up and Cool-down Performed on first and last piece of equipment    Resistance Training Performed No    VAD Patient? No    PAD/SET Patient? No      Pain Assessment   Currently in Pain? No/denies                Social History   Tobacco Use  Smoking Status Never   Passive exposure: Never  Smokeless Tobacco Never    Goals Met:  Independence with exercise equipment Exercise tolerated well No report of concerns or symptoms today  Goals Unmet:  Not Applicable  Comments: Pt able to follow exercise prescription today without complaint.  Will continue to monitor for progression.    Dr. Bethann Punches is Medical Director for [facility] Cardiac Rehabilitation.  Dr. Vida Rigger is Medical Director for [facility] Pulmonary Rehabilitation.

## 2023-02-25 ENCOUNTER — Encounter: Payer: 59 | Admitting: *Deleted

## 2023-02-25 DIAGNOSIS — Z955 Presence of coronary angioplasty implant and graft: Secondary | ICD-10-CM | POA: Diagnosis not present

## 2023-02-25 DIAGNOSIS — Z48812 Encounter for surgical aftercare following surgery on the circulatory system: Secondary | ICD-10-CM | POA: Diagnosis not present

## 2023-02-25 NOTE — Progress Notes (Signed)
Daily Session Note  Patient Details  Name: Cassandra Robbins MRN: [id number] Date of Birth: 06-30-1970 Referring Provider:   Flowsheet Row Cardiac Rehab from 12/10/2022 in [facility] Cardiac and Pulmonary Rehab  Referring Provider Dr. [name], MD       Encounter Date: 02/25/2023  Check In:  Session Check In - 02/25/23 0945       Check-In   Supervising physician immediately available to respond to emergencies See telemetry face sheet for immediately available ER MD    Location [facility]-Cardiac & Pulmonary Rehab    Staff Present Cora Collum, RN, BSN, CCRP;Margaret Best, MS, Exercise Physiologist;Maxon Conetta BS, , Exercise Physiologist;Kelly Madilyn Fireman, BS, ACSM CEP, Exercise Physiologist    Virtual Visit No    Medication changes reported     No    Fall or balance concerns reported    No    Warm-up and Cool-down Performed on first and last piece of equipment    Resistance Training Performed Yes    VAD Patient? No    PAD/SET Patient? No      Pain Assessment   Currently in Pain? No/denies                Social History   Tobacco Use  Smoking Status Never   Passive exposure: Never  Smokeless Tobacco Never    Goals Met:  Independence with exercise equipment Exercise tolerated well No report of concerns or symptoms today  Goals Unmet:  Not Applicable  Comments: Pt able to follow exercise prescription today without complaint.  Will continue to monitor for progression.    Dr. Bethann Punches is Medical Director for [facility] Cardiac Rehabilitation.  Dr. Vida Rigger is Medical Director for [facility] Pulmonary Rehabilitation.

## 2023-02-27 ENCOUNTER — Encounter: Payer: 59 | Admitting: *Deleted

## 2023-02-27 ENCOUNTER — Encounter: Payer: Self-pay | Admitting: *Deleted

## 2023-02-27 DIAGNOSIS — Z955 Presence of coronary angioplasty implant and graft: Secondary | ICD-10-CM | POA: Diagnosis not present

## 2023-02-27 DIAGNOSIS — Z48812 Encounter for surgical aftercare following surgery on the circulatory system: Secondary | ICD-10-CM | POA: Diagnosis not present

## 2023-02-27 NOTE — Progress Notes (Signed)
Cardiac Individual Treatment Plan  Patient Details  Name: Cassandra Robbins MRN: [id number] Date of Birth: [birth year] Referring Provider:   Flowsheet Row Cardiac Rehab from 12/10/2022 in [facility] Cardiac and Pulmonary Rehab  Referring Provider Dr. [name], MD       Initial Encounter Date:  Flowsheet Row Cardiac Rehab from 12/10/2022 in [facility] Cardiac and Pulmonary Rehab  Date 12/10/22       Visit Diagnosis: Status post coronary artery stent placement  Patient's Home Medications on Admission:  Current Outpatient Medications:    acetaminophen (TYLENOL) 500 MG tablet, Take 1,000 mg by mouth every 8 (eight) hours as needed for moderate pain., Disp: , Rfl:    amLODipine (NORVASC) 2.5 MG tablet, Take 1 tablet (2.5 mg total) by mouth daily., Disp: 90 tablet, Rfl: 3   amphetamine-dextroamphetamine (ADDERALL) 20 MG tablet, Take 1 tablet (20 mg total) by mouth 2 (two) times daily., Disp: 60 tablet, Rfl: 0   amphetamine-dextroamphetamine (ADDERALL) 20 MG tablet, Take 1 tablet (20 mg total) by mouth 2 (two) times daily., Disp: 60 tablet, Rfl: 0   [START ON 03/18/2023] amphetamine-dextroamphetamine (ADDERALL) 20 MG tablet, Take 1 tablet (20 mg total) by mouth 2 (two) times daily., Disp: 60 tablet, Rfl: 0   aspirin EC 81 MG tablet, Take 1 tablet (81 mg total) by mouth daily. Swallow whole., Disp: 90 tablet, Rfl: 3   cetirizine (ZYRTEC) 10 MG tablet, Take 10 mg by mouth daily., Disp: , Rfl:    clopidogrel (PLAVIX) 75 MG tablet, Take 1 tablet (75 mg total) by mouth daily with breakfast., Disp: 90 tablet, Rfl: 3   Evolocumab (REPATHA SURECLICK) 140 MG/ML SOAJ, Inject 140 mg into the skin every 14 (fourteen) days., Disp: 3 mL, Rfl: 7   fluticasone (FLONASE) 50 MCG/ACT nasal spray, Place 2 sprays into both nostrils daily., Disp: 16 g, Rfl: 6   ibuprofen (ADVIL) 200 MG tablet, Take 600 mg by mouth every 8 (eight) hours as needed for moderate pain., Disp: , Rfl:    ketoconazole (NIZORAL) 2 % cream, Apply 1  Application topically daily. (Patient taking differently: Apply 1 Application topically daily as needed for irritation.), Disp: 30 g, Rfl: 0   nitroGLYCERIN (NITROSTAT) 0.4 MG SL tablet, Place 1 tablet (0.4 mg total) under the tongue every 5 (five) minutes as needed for chest pain., Disp: 25 tablet, Rfl: 3   pantoprazole (PROTONIX) 40 MG tablet, Take 1 tablet (40 mg total) by mouth daily., Disp: 90 tablet, Rfl: 1   sodium chloride (OCEAN) 0.65 % SOLN nasal spray, Place 1 spray into both nostrils as needed for congestion., Disp: , Rfl:   Past Medical History: 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HCC)    Toe fracture, right 10/08/2017    Tobacco Use: Social History   Tobacco Use  Smoking Status Never   Passive exposure: Never  Smokeless Tobacco Never    Labs: Review Flowsheet  More data exists      Latest Ref Rng & Units 11/30/2020 01/26/2021 01/08/2022 10/12/2022 12/17/2022  Labs for ITP Cardiac and Pulmonary Rehab  Cholestrol 0 - 200 mg/dL - 952  841  324  401   LDL (calc) 0 - 99 mg/dL - 027  253  664  74   Direct LDL 0 -  99 mg/dL - - - 409  86   HDL-C >81 mg/dL - 54  57  59  49   Trlycerides <150 mg/dL - 191  478  99  295   Hemoglobin A1c 4.8 - 5.6 % 5.9  - 6.1  - -    Details             Exercise Target Goals: Exercise Program Goal: Individual exercise prescription set using results from initial 6 min walk test and THRR while considering  patient's activity barriers and safety.   Exercise Prescription Goal: Initial exercise prescription builds to 30-45 minutes a day of aerobic activity, 2-3 days per week.  Home exercise guidelines will be given to patient during program as part of exercise prescription that the participant will acknowledge.   Education: Aerobic Exercise: - Group  verbal and visual presentation on the components of exercise prescription. Introduces F.I.T.T principle from ACSM for exercise prescriptions.  Reviews F.I.T.T. principles of aerobic exercise including progression. Written material given at graduation. Flowsheet Row Cardiac Rehab from 12/10/2022 in [facility] Cardiac and Pulmonary Rehab  Education need identified 12/10/22       Education: Resistance Exercise: - Group verbal and visual presentation on the components of exercise prescription. Introduces F.I.T.T principle from ACSM for exercise prescriptions  Reviews F.I.T.T. principles of resistance exercise including progression. Written material given at graduation.    Education: Exercise & Equipment Safety: - Individual verbal instruction and demonstration of equipment use and safety with use of the equipment. Flowsheet Row Cardiac Rehab from 12/10/2022 in [facility] Cardiac and Pulmonary Rehab  Date 12/10/22  Educator NT  Instruction Review Code 1- Verbalizes Understanding       Education: Exercise Physiology & General Exercise Guidelines: - Group verbal and written instruction with models to review the exercise physiology of the cardiovascular system and associated critical values. Provides general exercise guidelines with specific guidelines to those with heart or lung disease.    Education: Flexibility, Balance, Mind/Body Relaxation: - Group verbal and visual presentation with interactive activity on the components of exercise prescription. Introduces F.I.T.T principle from ACSM for exercise prescriptions. Reviews F.I.T.T. principles of flexibility and balance exercise training including progression. Also discusses the mind body connection.  Reviews various relaxation techniques to help reduce and manage stress (i.e. Deep breathing, progressive muscle relaxation, and visualization). Balance handout provided to take home. Written material given at graduation.   Activity Barriers & Risk  Stratification:  Activity Barriers & Cardiac Risk Stratification - 12/10/22 1531       Activity Barriers & Cardiac Risk Stratification   Activity Barriers Arthritis    Cardiac Risk Stratification High             6 Minute Walk:  6 Minute Walk     Row Name 12/10/22 1529         6 Minute Walk   Phase Initial     Distance 1600 feet     Walk Time 6 minutes     # of Rest Breaks 0     MPH 3.03     METS 4.49     RPE 13     Perceived Dyspnea  1     VO2 Peak 15.71     Symptoms No     Resting HR 88 bpm     Resting BP 134/74     Resting Oxygen Saturation  100 %     Exercise Oxygen Saturation  during 6 min walk 96 %  Max Ex. HR 123 bpm     Max Ex. BP 166/72     2 Minute Post BP 148/72              Oxygen Initial Assessment:   Oxygen Re-Evaluation:   Oxygen Discharge (Final Oxygen Re-Evaluation):   Initial Exercise Prescription:  Initial Exercise Prescription - 12/10/22 1500       Date of Initial Exercise RX and Referring Provider   Date 12/10/22    Referring Provider Dr. [name], MD      Oxygen   Maintain Oxygen Saturation 88% or higher      Treadmill   MPH 2.6    Grade 0.5    Minutes 15    METs 3.17      NuStep   Level 3   T6 Nustep   SPM 80    Minutes 15    METs 4.49      REL-XR   Level 4    Speed 50    Minutes 15    METs 4.49      Prescription Details   Frequency (times per week) 2    Duration Progress to 30 minutes of continuous aerobic without signs/symptoms of physical distress      Intensity   THRR 40-80% of Max Heartrate 119-151    Ratings of Perceived Exertion 11-13    Perceived Dyspnea 0-4      Progression   Progression Continue to progress workloads to maintain intensity without signs/symptoms of physical distress.      Resistance Training   Training Prescription Yes    Weight 5 lb    Reps 10-15             Perform Capillary Blood Glucose checks as needed.  Exercise Prescription Changes:   Exercise  Prescription Changes     Row Name 12/10/22 1500 01/17/23 0800 01/31/23 1600 02/11/23 0900 02/14/23 0700     Response to Exercise   Blood Pressure (Admit) 134/74 118/68 124/70 -- 112/66   Blood Pressure (Exercise) 166/72 168/70 144/78 -- --   Blood Pressure (Exit) 148/72 124/62 104/62 -- 118/68   Heart Rate (Admit) 88 bpm 84 bpm 88 bpm -- 68 bpm   Heart Rate (Exercise) 128 bpm 153 bpm 156 bpm -- 143 bpm   Heart Rate (Exit) 95 bpm 116 bpm 102 bpm -- 107 bpm   Oxygen Saturation (Admit) 100 % -- -- -- --   Oxygen Saturation (Exercise) 96 % -- -- -- --   Rating of Perceived Exertion (Exercise) 13 14 13  -- 13   Perceived Dyspnea (Exercise) 1 -- 0 -- 0   Symptoms None none none -- none   Comments Results First two weeks of exercise First two weeks of exercise -- First two weeks of exercise   Duration -- Continue with 30 min of aerobic exercise without signs/symptoms of physical distress. Continue with 30 min of aerobic exercise without signs/symptoms of physical distress. -- Continue with 30 min of aerobic exercise without signs/symptoms of physical distress.   Intensity -- THRR unchanged THRR unchanged THRR unchanged THRR unchanged     Progression   Progression -- Continue to progress workloads to maintain intensity without signs/symptoms of physical distress. Continue to progress workloads to maintain intensity without signs/symptoms of physical distress. Continue to progress workloads to maintain intensity without signs/symptoms of physical distress. Continue to progress workloads to maintain intensity without signs/symptoms of physical distress.   Average METs -- 4.83 5.1 5.1 5.22  Resistance Training   Training Prescription -- Yes Yes Yes Yes   Weight -- 5 lb 6lb 6lb 6lb   Reps -- 10-15 10-15 10-15 10-15     Interval Training   Interval Training -- No No No No     Treadmill   MPH -- 3 4 4  3.4   Grade -- 3 3.2 3.2 4   Minutes -- 15 15 15 15    METs -- 4.54 5.83 5.83 5.48      NuStep   Level -- 4 -- -- --   Minutes -- 15 -- -- --   METs -- 5.6 -- -- --     Elliptical   Level -- 4 6 6 7    Speed -- 3 1 1  2.8   Minutes -- 15 15 15 15    METs -- -- 4.2 4.2 4.5     REL-XR   Level -- 5 8 8 7    Minutes -- 15 15 15 15    METs -- 6.1 6.7 6.7 7     Home Exercise Plan   Plans to continue exercise at -- -- -- Home (comment)  walking at home on TM --   Frequency -- -- -- Add 2 additional days to program exercise sessions. --   Initial Home Exercises Provided -- -- -- 02/11/23 --     Oxygen   Maintain Oxygen Saturation -- 88% or higher 88% or higher 88% or higher 88% or higher            Exercise Comments:   Exercise Comments     Row Name 01/02/23 1020           Exercise Comments First full day of exercise!  Patient was oriented to gym and equipment including functions, settings, policies, and procedures.  Patient's individual exercise prescription and treatment plan were reviewed.  All starting workloads were established based on the results of the 6 minute walk test done at initial orientation visit.  The plan for exercise progression was also introduced and progression will be customized based on patient's performance and goals.                Exercise Goals and Review:   Exercise Goals     Row Name 12/10/22 1532             Exercise Goals   Increase Physical Activity Yes       Intervention Provide advice, education, support and counseling about physical activity/exercise needs.;Develop an individualized exercise prescription for aerobic and resistive training based on initial evaluation findings, risk stratification, comorbidities and participant's personal goals.       Expected Outcomes Short Term: Attend rehab on a regular basis to increase amount of physical activity.;Long Term: Add in home exercise to make exercise part of routine and to increase amount of physical activity.;Long Term: Exercising regularly at least 3-5 days a week.        Increase Strength and Stamina Yes       Intervention Provide advice, education, support and counseling about physical activity/exercise needs.;Develop an individualized exercise prescription for aerobic and resistive training based on initial evaluation findings, risk stratification, comorbidities and participant's personal goals.       Expected Outcomes Short Term: Increase workloads from initial exercise prescription for resistance, speed, and METs.;Short Term: Perform resistance training exercises routinely during rehab and add in resistance training at home;Long Term: Improve cardiorespiratory fitness, muscular endurance and strength as measured by increased METs and functional capacity ( )  Able to understand and use rate of perceived exertion (RPE) scale Yes       Intervention Provide education and explanation on how to use RPE scale       Expected Outcomes Short Term: Able to use RPE daily in rehab to express subjective intensity level;Long Term:  Able to use RPE to guide intensity level when exercising independently       Able to understand and use Dyspnea scale Yes       Intervention Provide education and explanation on how to use Dyspnea scale       Expected Outcomes Short Term: Able to use Dyspnea scale daily in rehab to express subjective sense of shortness of breath during exertion;Long Term: Able to use Dyspnea scale to guide intensity level when exercising independently       Knowledge and understanding of Target Heart Rate Range (THRR) Yes       Intervention Provide education and explanation of THRR including how the numbers were predicted and where they are located for reference       Expected Outcomes Short Term: Able to state/look up THRR;Long Term: Able to use THRR to govern intensity when exercising independently;Short Term: Able to use daily as guideline for intensity in rehab       Able to check pulse independently Yes       Intervention Provide education and  demonstration on how to check pulse in carotid and radial arteries.;Review the importance of being able to check your own pulse for safety during independent exercise       Expected Outcomes Short Term: Able to explain why pulse checking is important during independent exercise;Long Term: Able to check pulse independently and accurately       Understanding of Exercise Prescription Yes       Intervention Provide education, explanation, and written materials on patient's individual exercise prescription       Expected Outcomes Short Term: Able to explain program exercise prescription;Long Term: Able to explain home exercise prescription to exercise independently                Exercise Goals Re-Evaluation :  Exercise Goals Re-Evaluation     Row Name 01/02/23 1021 01/17/23 0844 01/23/23 0924 01/31/23 1641 02/11/23 0944     Exercise Goal Re-Evaluation   Exercise Goals Review Increase Physical Activity;Able to understand and use rate of perceived exertion (RPE) scale;Knowledge and understanding of Target Heart Rate Range (THRR);Understanding of Exercise Prescription;Increase Strength and Stamina;Able to check pulse independently Increase Physical Activity;Increase Strength and Stamina;Understanding of Exercise Prescription Increase Physical Activity;Increase Strength and Stamina;Understanding of Exercise Prescription Increase Physical Activity;Increase Strength and Stamina;Understanding of Exercise Prescription Increase Strength and Stamina;Increase Physical Activity;Able to understand and use rate of perceived exertion (RPE) scale;Able to understand and use Dyspnea scale;Knowledge and understanding of Target Heart Rate Range (THRR);Able to check pulse independently;Understanding of Exercise Prescription   Comments Reviewed RPE  and dyspnea scale, THR and program prescription with pt today.  Pt voiced understanding and was given a copy of goals to take home. Mylani is off to a good start in the  program. She was able to increase her treadmill workload to a speed of 3 mph with a 3% incline during her first two weeks of rehab. She also improved to level 4 on the elliptical and level 5 on the XR. We will continue to monitor her progress in the program. Halston states that she feels she is doing well with exercise. She reports progressively increasing  her workloads on the treadmill and XR. She also states that she feels better after exercise, and has not felt as fatigued. She also states that she has been walking 20 minutes a day on a treadmill at home on the days when she is not coming to rehab as well. We will continue to monitor her progress. Kerrianne continues to do well and show progress in rehab. She has recently increased her hand weights from 5 to 6 lb. On her exercise machines, she has increased her treadmill speed from to , and on the XR she increased her level from 5 to 8. We will continue to monitor her progress in the program. Reviewed home exercise with pt today.  Pt plans to walk at home on home TM for exercise.  Reviewed THR, pulse, RPE, sign and symptoms, pulse oximetery and when to call 911 or MD.  Also discussed weather considerations and indoor options.  Pt voiced understanding.   Expected Outcomes Short: Use RPE daily to regulate intensity.  Long: Follow program prescription in THR. Short: Continue to progressively increase exercise workloads. Long: Continue exercise to improve strength and stamina. Short: Continue to walk on days away from rehab. Long: Continue to exercise independently. Short: Continue to follow current exercise prescription, and progressively increase workloads. Long: Continue exercise to improve strength and stamina. Short: add 1-2 days of exercise at home on off days of rehab. Long: Become independent with exercise routine.    Row Name 02/14/23 0704             Exercise Goal Re-Evaluation   Exercise Goals Review Increase Physical Activity;Increase  Strength and Stamina;Understanding of Exercise Prescription       Comments Iesha continues to do well in rehab. She has recently been able to increase her level on the elliptical from level 6 to 7, she also was able to increase her speed to 2.47mph. She continues to maintain her workload on the treadmill and the XR. We will continue to monitor her progress in the program.       Expected Outcomes Short: Increase XR level to 8. Long: Continue to exercise to improve strength and stamina.                Discharge Exercise Prescription (Final Exercise Prescription Changes):  Exercise Prescription Changes - 02/14/23 0700       Response to Exercise   Blood Pressure (Admit) 112/66    Blood Pressure (Exit) 118/68    Heart Rate (Admit) 68 bpm    Heart Rate (Exercise) 143 bpm    Heart Rate (Exit) 107 bpm    Rating of Perceived Exertion (Exercise) 13    Perceived Dyspnea (Exercise) 0    Symptoms none    Comments First two weeks of exercise    Duration Continue with 30 min of aerobic exercise without signs/symptoms of physical distress.    Intensity THRR unchanged      Progression   Progression Continue to progress workloads to maintain intensity without signs/symptoms of physical distress.    Average METs 5.22      Resistance Training   Training Prescription Yes    Weight 6lb    Reps 10-15      Interval Training   Interval Training No      Treadmill   MPH 3.4    Grade 4    Minutes 15    METs 5.48      Elliptical   Level 7    Speed 2.8  Minutes 15    METs 4.5      REL-XR   Level 7    Minutes 15    METs 7      Oxygen   Maintain Oxygen Saturation 88% or higher             Nutrition:  Target Goals: Understanding of nutrition guidelines, daily intake of sodium 1500mg , cholesterol 200mg , calories 30% from fat and 7% or less from saturated fats, daily to have 5 or more servings of fruits and vegetables.  Education: All About Nutrition: -Group instruction  provided by verbal, written material, interactive activities, discussions, models, and posters to present general guidelines for heart healthy nutrition including fat, fiber, MyPlate, the role of sodium in heart healthy nutrition, utilization of the nutrition label, and utilization of this knowledge for meal planning. Follow up email sent as well. Written material given at graduation.   Biometrics:  Pre Biometrics - 12/10/22 1533       Pre Biometrics   Height 5\' 11"  (1.803 m)    Weight 248 lb 9.6 oz (112.8 kg)    Waist Circumference 38.5 inches    Hip Circumference 49 inches    Waist to Hip Ratio 0.79 %    BMI (Calculated) 34.69    Single Leg Stand 30 seconds              Nutrition Therapy Plan and Nutrition Goals:  Nutrition Therapy & Goals - 12/10/22 1507       Nutrition Therapy   Diet Cardiac, low na    Protein (specify units) 90    Fiber 25 grams    Whole Grain Foods 3 servings    Saturated Fats 15 max. grams    Fruits and Vegetables 5 servings/day    Sodium 2 grams      Personal Nutrition Goals   Nutrition Goal Eat a complex carb with every meal    Personal Goal #2 Be consistent with breakfast    Personal Goal #3 Continue to watch portions and eat slowly    Comments Patient drinking 90-120oz of water. Cup of coffee daily. She tries to eat 3 times per day. Before her stent she was often missing breakfast and then overeating at lunch/dinner. Says that now she has to take medication with food in the morning she is better with eating 3 meals per day. Set goal to continue to work on consistency and make these changes more permanent. Talked about choosing complex carbs and pairing wit protein to help stay full and stabilize blood sugars. 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 Tries to avoid it. Built out several meals and snacks with non-inflammatory foods. Encouraged plant based protein like beans, lentils, edemame, chickpeas, ect. These would be good non  gluten based complex carbs. She likes fruit as well. Set goal to make sure she is eating a carb at each meal. Patient showed a lot of skill at identify shortcomings in her diet, spoke about setting goal to put into action changes like mindful slow eating to control portions.      Intervention Plan   Intervention Prescribe, educate and counsel regarding individualized specific dietary modifications aiming towards targeted core components such as weight, hypertension, lipid management, diabetes, heart failure and other comorbidities.;Nutrition handout(s) given to patient.    Expected Outcomes Short Term Goal: Understand basic principles of dietary content, such as calories, fat, sodium, cholesterol and nutrients.;Short Term Goal: A plan has been developed with personal nutrition goals set during  dietitian appointment.;Long Term Goal: Adherence to prescribed nutrition plan.             Nutrition Assessments:  MEDIFICTS Score Key: >=70 Need to make dietary changes  40-70 Heart Healthy Diet <= 40 Therapeutic Level Cholesterol Diet  Flowsheet Row Cardiac Rehab from 12/10/2022 in [facility] Cardiac and Pulmonary Rehab  Picture Your Plate Total Score on Admission 56      Picture Your Plate Scores: <19 Unhealthy dietary pattern with much room for improvement. 41-50 Dietary pattern unlikely to meet recommendations for good health and room for improvement. 51-60 More healthful dietary pattern, with some room for improvement.  >60 Healthy dietary pattern, although there may be some specific behaviors that could be improved.    Nutrition Goals Re-Evaluation:  Nutrition Goals Re-Evaluation     Row Name 01/23/23 0931 02/26/23 0816           Goals   Comment Terricka states that she is still working on the dietary guidelines she discussed with the RD. She reports doing well with portion control. She also states that she has been eating healthy, and occasionally enjoying movie theater popcorn. We will  continue to monitor her nutrition. Aalaiyah continues to work on dietary guidelines discussed with RD. She is watching food labels but not trying to be super restrictive. She is also trying to limit her junk food to once a month. She is also focusing on getting plenty of water throughout the day.      Expected Outcome Short: Continue to work on portion control. Long: Practice heart-healthy eating patterns discussed with RD. Short: Continue to work on getting plenty of water. Long: Practice heart-healthy eating patterns discussed with RD.               Nutrition Goals Discharge (Final Nutrition Goals Re-Evaluation):  Nutrition Goals Re-Evaluation - 02/26/23 0816       Goals   Comment Ray continues to work on dietary guidelines discussed with RD. She is watching food labels but not trying to be super restrictive. She is also trying to limit her junk food to once a month. She is also focusing on getting plenty of water throughout the day.    Expected Outcome Short: Continue to work on getting plenty of water. Long: Practice heart-healthy eating patterns discussed with RD.

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
## 2023-03-01 ENCOUNTER — Encounter: Payer: 59 | Attending: Cardiology | Admitting: *Deleted

## 2023-03-01 DIAGNOSIS — Z955 Presence of coronary angioplasty implant and graft: Secondary | ICD-10-CM | POA: Insufficient documentation

## 2023-03-01 DIAGNOSIS — Z419 Encounter for procedure for purposes other than remedying health state, unspecified: Secondary | ICD-10-CM | POA: Diagnosis not present

## 2023-03-01 NOTE — Progress Notes (Unsigned)
Cardiology Office Note    Date:  03/07/2023   ID:  [name], DOB 1970-07-05, MRN [id number]  PCP:  [name], DO  Cardiologist:  [name], MD  Electrophysiologist:  None   Chief Complaint: Follow up  History of Present Illness:   Cassandra Robbins is a 52 y.o. female with history of CAD status post PCI/DES to the RCA and LPAV, HLD, and stills disease who presents for follow-up of his CAD.   She was evaluated by Dr. Herbie Baltimore in 08/2022 for shortness of breath and stridor following URI in early 2024.  Subsequent coronary CTA in 09/2022 showed a calcium score of 0.  There was noncalcified plaque in the RCA estimated at 50 to 69% with a ctFFR of 0.72.  Echo in 09/2022 showed an EF of 55 to 60%, no regional wall motion normalities, normal LV diastolic function parameters, normal RV systolic function and ventricular cavity size, mild mitral regurgitation, and an estimated right atrial pressure of 3 mmHg.  LHC on 10/19/2022 showed severe two-vessel CAD involving the mid RCA with 80% stenosis in proximal LPAV with 80% stenosis with successful PCI/DES to the proximal to mid RCA and LPAV.  There was nonobstructive disease involving the proximal LAD of 40% at P1/D1 takeoff, as well as 20 to 30% mid RCA stenosis just distal to the significant lesion.  Normal LVEDP.  Procedure was complicated by significant coronary spasm involving the LAD resolved with intracoronary nitroglycerin and additional sedation, with postprocedure complicated by headache and visual changes with CTA head nonacute.  Neurology felt the patient had nitroglycerin or spasm induced migraine.  She was last seen in the office in 01/2023 and was without symptoms of angina or cardiac decompensation.  She did note an occasional sensation in her throat similar to what she experienced eating after PCI, though not as severe or exactly similar.  There was question if she was having some vasospasm leading to the initiation of amlodipine 2.5  mg.  She was taking rosuvastatin twice weekly and continued to note myalgias and nausea.  It was noted she was also previously intolerant to atorvastatin.  She was transitioned from rosuvastatin to Repatha.   She comes in doing well from a cardiac perspective and is without symptoms of angina or cardiac decompensation.  She noted an increase in fatigue following the initiation of amlodipine.  No change in throat discomfort that she now wonders if is related to underlying allergies.  No symptoms suggestive of prior angina at this time.  No falls or symptoms concerning for bleeding.  Adherent to DAPT, missed one dose clopidogrel.  No presyncope or syncope.  Bruises noted on left bicep following Repatha injection.   Labs independently reviewed: 11/2022 - TC 145, TG 109, HDL 49, LDL 74, direct LDL 86, albumin 4.0, AST/ALT normal 09/2022 - Hgb 12.2, PLT 261, potassium 4.2, BUN 12, serum creatinine 0.75 12/2021 - A1c 6.1 12/2020 - TSH normal  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HCC)    Toe fracture, right 10/08/2017    Past Surgical History:  Procedure Laterality Date   COLONOSCOPY WITH PROPOFOL N/A 02/21/2022   Procedure: COLONOSCOPY WITH PROPOFOL;  Surgeon: Toney Reil, MD;  Location: [facility] ENDOSCOPY;  Service: Gastroenterology;  Laterality:  N/A;   CORONARY STENT INTERVENTION N/A 10/19/2022   Procedure: CORONARY STENT INTERVENTION;  Surgeon: Marykay Lex, MD;  Location: [facility] INVASIVE CV LAB;  Service: Cardiovascular;  Laterality: N/A;   LAPAROSCOPIC ENDOMETRIOSIS FULGURATION  2007   LEFT HEART CATH AND CORONARY ANGIOGRAPHY N/A 10/19/2022   Procedure: LEFT HEART CATH AND CORONARY ANGIOGRAPHY;  Surgeon: Marykay Lex, MD;  Location: [facility] INVASIVE CV LAB;  Service: Cardiovascular;  Laterality: N/A;    SEPTOPLASTY  2005   TUBAL LIGATION      Current Medications: Current Meds  Medication Sig   acetaminophen (TYLENOL) 500 MG tablet Take 1,000 mg by mouth every 8 (eight) hours as needed for moderate pain.   amphetamine-dextroamphetamine (ADDERALL) 20 MG tablet Take 1 tablet (20 mg total) by mouth 2 (two) times daily.   amphetamine-dextroamphetamine (ADDERALL) 20 MG tablet Take 1 tablet (20 mg total) by mouth 2 (two) times daily.   [START ON 03/18/2023] amphetamine-dextroamphetamine (ADDERALL) 20 MG tablet Take 1 tablet (20 mg total) by mouth 2 (two) times daily.   aspirin EC 81 MG tablet Take 1 tablet (81 mg total) by mouth daily. Swallow whole.   cetirizine (ZYRTEC) 10 MG tablet Take 10 mg by mouth daily.   clopidogrel (PLAVIX) 75 MG tablet Take 1 tablet (75 mg total) by mouth daily with breakfast.   Evolocumab (REPATHA SURECLICK) 140 MG/ML SOAJ Inject 140 mg into the skin every 14 (fourteen) days.   fluticasone (FLONASE) 50 MCG/ACT nasal spray Place 2 sprays into both nostrils daily.   ibuprofen (ADVIL) 200 MG tablet Take 600 mg by mouth every 8 (eight) hours as needed for moderate pain.   nitroGLYCERIN (NITROSTAT) 0.4 MG SL tablet Place 1 tablet (0.4 mg total) under the tongue every 5 (five) minutes as needed for chest pain.   pantoprazole (PROTONIX) 40 MG tablet Take 1 tablet (40 mg total) by mouth daily.   sodium chloride (OCEAN) 0.65 % SOLN nasal spray Place 1 spray into both nostrils as needed for congestion.   [DISCONTINUED] amLODipine (NORVASC) 2.5 MG tablet Take 1 tablet (2.5 mg total) by mouth daily.    Allergies:   Penicillins, Amoxicillin, and Morphine   Social History   Socioeconomic History   Marital status: Divorced    Spouse name: Not on file   Number of children: Not on file   Years of education: Not on file   Highest education level: Associate degree: academic program  Occupational History   Occupation: paramedic  Tobacco Use   Smoking status: Never    Passive  exposure: Never   Smokeless tobacco: Never  Vaping Use   Vaping status: Never Used  Substance and Sexual Activity   Alcohol use: Yes    Alcohol/week: 2.0 - 4.0 standard drinks of alcohol    Types: 1 - 2 Glasses of wine, 1 - 2 Shots of liquor per week    Comment: on occasion   Drug use: No   Sexual activity: Yes    Birth control/protection: Other-see comments    Comment: Tibal ligation  Other Topics Concern   Not on file  Social History Narrative   She has been a paramedic for 32 years.     Social Determinants of Health   Financial Resource Strain: Low Risk  (08/02/2022)   Overall Financial Resource Strain (CARDIA)    Difficulty of Paying Living Expenses: Not hard at all  Food Insecurity: No Food Insecurity (08/02/2022)   Hunger Vital Sign    Worried About Running Out of  Food in the Last Year: Never true    Ran Out of Food in the Last Year: Never true  Transportation Needs: No Transportation Needs (08/02/2022)   PRAPARE - Administrator, Civil Service (Medical): No    Lack of Transportation (Non-Medical): No  Physical Activity: Unknown (08/02/2022)   Exercise Vital Sign    Days of Exercise per Week: 0 days    Minutes of Exercise per Session: Not on file  Stress: Stress Concern Present (08/02/2022)   [employer] of Occupational Health - Occupational Stress Questionnaire    Feeling of Stress : To some extent  Social Connections: Moderately Integrated (08/02/2022)   Social Connection and Isolation Panel [NHANES]    Frequency of Communication with Friends and Family: More than three times a week    Frequency of Social Gatherings with Friends and Family: Three times a week    Attends Religious Services: More than 4 times per year    Active Member of Clubs or Organizations: Yes    Attends Engineer, structural: More than 4 times per year    Marital Status: Divorced     Family History:  The patient's family history includes Asthma in her mother; Cancer (age of  onset: 68) in her father; Lung disease in her mother; Lymphoma in her sister; Pulmonary fibrosis in her maternal grandfather; Rheum arthritis in her sister; Thyroid disease in her mother. There is no history of Breast cancer.  ROS:   12-point review of systems is negative unless otherwise noted in the HPI.   EKGs/Labs/Other Studies Reviewed:    Studies reviewed were summarized above. The additional studies were reviewed today:  LHC 10/19/2022:   Prox LAD to Mid LAD lesion is 40% stenosed. => Notable spasm after PCI, relieved with nitroglycerin.   ---------------------------------------------------   Prox RCA to Mid RCA lesion is 80% stenosed.   A drug-eluting stent was successfully placed using a SYNERGY XD 3.50X16 => postdilated to 3.8 mm. Post intervention, there is a 0% residual stenosis.TIMI-3 flow pre and post   ---------------------------------------------------   LPAV lesion is 80% stenosed with 80% stenosed side branch in 2nd LPL.   Score score flex angioplasty performed followed by successful placement of a A drug-eluting stent using a SYNERGY XD 3.0X20.  Postdilated to 3.6 mm; Post intervention, there is a 0% residual stenosis -> going from LPA V into LPL 2; TIMI-3 flow pre and post   Mid RCA lesion is 30% stenosed.   ----------------------------------------------------   LV end diastolic pressure is normal.   There is no aortic valve stenosis.   ---------------------------------------------------   Recommend uninterrupted dual antiplatelet therapy with Aspirin 81mg  daily and Clopidogrel 75mg  daily for a minimum of 6 months (stable ischemic heart disease-Class I recommendation).   Will reduce to maintenance Plavix monotherapy 75 mg daily to complete additional 18 months.  Can be held for procedures after 6 months.   POST-CATH DIAGNOSES Severe two-vessel CAD involving mid RCA 80% stenosis and proximal (LPAV)-LPL-2 80% stenosis both were by FFR ct\ Successful DES PCI of proximal to  mid RCA a 80% stenosis reduced to 0% with Synergy DES 3.5 mm x 16 mm (postdilated to 3.8 mm). TIMI-3 flow pre and post. Successful DES PCI of LPAV/LPL 2 reducing 80% to 0% with Synergy DES 3.0 mm by 20 mm (postdilated to 3.6 mm.). TIMI-3 flow pre and post Proximal LAD 40% stenosis at SP1/D1 takeoff, also 20 to 30% mid RCA just distal to the significant lesion. Normal LVEDP  Significant coronary spasm after post dilation of LCx lesion-spasm of the proximal LAD for percent stenosis resolved with IC and additional sedation.  She did have significant discomfort with this both the throat discomfort which was her angina as well as true substernal chest pressure. This resolved after nitroglycerin and additional sedation; it also coincided with brachial/radial arterial spasm on the guide catheter. Postprocedure patient had mild headache with visual after maladies. Code stroke called, neurology suspected nitroglycerin or spasm induced migraine headache but agreed with CT of head.      RECOMMENDATIONS Based on the 10 spasm, plan is to treat with IV NTG, however may change to verapamil per neurology. CT scan of the head to evaluate visual symptoms. Will monitor overnight outpatient with extended recovery. __________   2D echo 10/17/2022: 1. Left ventricular ejection fraction, by estimation, is 55 to 60%. Left  ventricular ejection fraction by 2D MOD biplane is 56.9 %. The left  ventricle has normal function. The left ventricle has no regional wall  motion abnormalities. Left ventricular  diastolic parameters were normal. The average left ventricular global  longitudinal strain is -22.2 %. The global longitudinal strain is normal.   2. Right ventricular systolic function is normal. The right ventricular  size is normal.   3. The mitral valve is normal in structure. Mild mitral valve  regurgitation.   4. The aortic valve was not well visualized. Aortic valve regurgitation  is not visualized.   5. The  inferior vena cava is normal in size with greater than 50%  respiratory variability, suggesting right atrial pressure of 3 mmHg.  ___________   Coronary CTA 10/08/2022: FINDINGS: Aorta:  Normal size.  No calcifications.  No dissection.   Aortic Valve:  Trileaflet.  No calcifications.   Coronary Arteries:  Normal coronary origin.  Right dominance.   RCA is a dominant artery. There is non calcified plaque proximally causing moderate stenosis (50-69%).   Left main gives rise to LAD and LCX arteries. LM has no disease.   LAD has no plaque.   LCX is a non-dominant artery.  There is no plaque.   Other findings:   Normal pulmonary vein drainage into the left atrium.   Normal left atrial appendage without a thrombus.   Normal size of the pulmonary artery.   IMPRESSION: 1. Coronary calcium score of 0. 2. Normal coronary origin with right dominance. 3. Non calcified plaque in the proximal RCA causing moderate stenosis (50-69%). 4. CAD-RADS 3. Moderate stenosis. Consider symptom-guided anti-ischemic pharmacotherapy as well as risk factor modification per guideline directed care. 5. Additional analysis with CT FFR will be submitted and reported separately.   ctFFR: 1. Left Main:  No significant stenosis. 2. LAD: No significant stenosis.  FFRct 0.92 3. LCX: No significant stenosis.  FFRct 0.98 4. RCA: significant stenosis in the proximal RCA.  FFRct 0.72   IMPRESSION: 1. CT FFR analysis showed significant stenosis in the proximal RCA. FFRct 0.72 2.  Recommend cardiac catheterization.   EKG:  EKG is not ordered today.    Recent Labs: 10/02/2022: BNP 3.8 10/20/2022: BUN 12; Creatinine, Ser 0.75; Hemoglobin 12.2; Platelets 261; Potassium 4.2; Sodium 138 12/17/2022: ALT 21  Recent Lipid Panel    Component Value Date/Time   CHOL 145 12/17/2022 1153   CHOL 225 (H) 01/08/2022 1124   TRIG 109 12/17/2022 1153   HDL 49 12/17/2022 1153   HDL 57 01/08/2022 1124   CHOLHDL 3.0  12/17/2022 1153   VLDL 22 12/17/2022 1153  LDLCALC 74 12/17/2022 1153   LDLCALC 143 (H) 01/08/2022 1124   LDLDIRECT 86 12/17/2022 1153    PHYSICAL EXAM:    VS:  BP 116/80 (BP Location: Left Arm, Patient Position: Sitting, Cuff Size: Normal)   Pulse 80   Ht 5\' 10"  (1.778 m)   Wt 241 lb 3.2 oz (109.4 kg)   SpO2 98%   BMI 34.61 kg/m   BMI: Body mass index is 34.61 kg/m.  Physical Exam Vitals reviewed.  Constitutional:      Appearance: She is well-developed.  HENT:     Head: Normocephalic and atraumatic.  Eyes:     General:        Right eye: No discharge.        Left eye: No discharge.  Neck:     Vascular: No JVD.  Cardiovascular:     Rate and Rhythm: Normal rate and regular rhythm.     Pulses:          Posterior tibial pulses are 2+ on the right side and 2+ on the left side.     Heart sounds: Normal heart sounds, S1 normal and S2 normal. Heart sounds not distant. No midsystolic click and no opening snap. No murmur heard.    No friction rub.  Pulmonary:     Effort: Pulmonary effort is normal. No respiratory distress.     Breath sounds: Normal breath sounds. No decreased breath sounds, wheezing, rhonchi or rales.  Chest:     Chest wall: No tenderness.  Abdominal:     General: There is no distension.  Musculoskeletal:     Cervical back: Normal range of motion.     Right lower leg: No edema.     Left lower leg: No edema.  Skin:    General: Skin is warm and dry.     Nails: There is no clubbing.  Neurological:     Mental Status: She is alert and oriented to person, place, and time.  Psychiatric:        Speech: Speech normal.        Behavior: Behavior normal.        Thought Content: Thought content normal.        Judgment: Judgment normal.     Wt Readings from Last 3 Encounters:  03/07/23 241 lb 3.2 oz (109.4 kg)  02/04/23 248 lb (112.5 kg)  01/17/23 249 lb 12.8 oz (113.3 kg)     ASSESSMENT & PLAN:   CAD involving the native coronary arteries without angina:  She is doing well and without symptoms concerning for angina or cardiac decompensation.  Continue uninterrupted DAPT with aspirin 81 mg echo medical 75 mg for at least 6 months (04/20/2023) followed by reduction to clopidogrel monotherapy for an additional 18 months.  Okay to discontinue amlodipine given fatigue noted with initiation, and no change in symptoms.  Participating with cardiac rehab.  No plans for further ischemic evaluation at this time.  HLD with statin intolerance: LDL 74 in 11/2022 with target LDL less than 70.  This is improved from an LDL of 160 in 09/2022.  Intolerant to atorvastatin and rosuvastatin, even at twice weekly dosing.  Now on Repatha.  Follow-up lipid panel in 2 months.   Disposition: F/u with Dr. Herbie Baltimore or an APP in 2 months.   Medication Adjustments/Labs and Tests Ordered: Current medicines are reviewed at length with the patient today.  Concerns regarding medicines are outlined above. Medication changes, Labs and Tests ordered today are summarized above and  listed in the Patient Instructions accessible in Encounters.   Signed, Eula Listen, PA-C 03/07/2023 12:03 PM     [facility] [place] [address] [place], [place] [address] [phone]

## 2023-03-01 NOTE — Progress Notes (Signed)
Daily Session Note  Patient Details  Name: Cassandra Robbins MRN: [id number] Date of Birth: 1970-08-10 Referring Provider:   Flowsheet Row Cardiac Rehab from 12/10/2022 in [facility] Cardiac and Pulmonary Rehab  Referring Provider Dr. [name], MD       Encounter Date: 03/01/2023  Check In:  Session Check In - 03/01/23 0928       Check-In   Supervising physician immediately available to respond to emergencies See telemetry face sheet for immediately available ER MD    Location [facility]-Cardiac & Pulmonary Rehab    Staff Present Ronette Deter, BS, Exercise Physiologist;Joseph Shelbie Proctor, RN, [place]    Virtual Visit No    Medication changes reported     No    Fall or balance concerns reported    No    Warm-up and Cool-down Performed on first and last piece of equipment    Resistance Training Performed Yes    VAD Patient? No    PAD/SET Patient? No      Pain Assessment   Currently in Pain? No/denies                Social History   Tobacco Use  Smoking Status Never   Passive exposure: Never  Smokeless Tobacco Never    Goals Met:  Independence with exercise equipment Exercise tolerated well No report of concerns or symptoms today Strength training completed today  Goals Unmet:  Not Applicable  Comments: Pt able to follow exercise prescription today without complaint.  Will continue to monitor for progression.    Dr. Bethann Punches is Medical Director for [facility] Cardiac Rehabilitation.  Dr. Vida Rigger is Medical Director for [facility] Pulmonary Rehabilitation.

## 2023-03-04 ENCOUNTER — Encounter: Payer: 59 | Admitting: *Deleted

## 2023-03-04 DIAGNOSIS — Z955 Presence of coronary angioplasty implant and graft: Secondary | ICD-10-CM | POA: Diagnosis not present

## 2023-03-04 NOTE — Progress Notes (Signed)
Daily Session Note  Patient Details  Name: Cassandra Robbins MRN: [id number] Date of Birth: August 01, 1970 Referring Provider:   Flowsheet Row Cardiac Rehab from 12/10/2022 in [facility] Cardiac and Pulmonary Rehab  Referring Provider Dr. [name], MD       Encounter Date: 03/04/2023  Check In:  Session Check In - 03/04/23 0928       Check-In   Supervising physician immediately available to respond to emergencies See telemetry face sheet for immediately available ER MD    Location [facility]-Cardiac & Pulmonary Rehab    Staff Present Cora Collum, RN, BSN, Matthew Saras, BS, ACSM CEP, Exercise Physiologist;Maxon Conetta BS, , Exercise Physiologist    Virtual Visit No    Medication changes reported     No    Fall or balance concerns reported    No    Warm-up and Cool-down Performed on first and last piece of equipment    Resistance Training Performed Yes    VAD Patient? No    PAD/SET Patient? No      Pain Assessment   Currently in Pain? No/denies                Social History   Tobacco Use  Smoking Status Never   Passive exposure: Never  Smokeless Tobacco Never    Goals Met:  Independence with exercise equipment Exercise tolerated well No report of concerns or symptoms today  Goals Unmet:  Not Applicable  Comments: Pt able to follow exercise prescription today without complaint.  Will continue to monitor for progression.    Dr. Bethann Punches is Medical Director for [facility] Cardiac Rehabilitation.  Dr. Vida Rigger is Medical Director for [facility] Pulmonary Rehabilitation.

## 2023-03-06 ENCOUNTER — Encounter: Payer: 59 | Admitting: *Deleted

## 2023-03-06 ENCOUNTER — Encounter: Payer: Self-pay | Admitting: Pharmacist

## 2023-03-06 NOTE — Telephone Encounter (Signed)
We did win appeals. Repatha now covered. I sent a message to patient.

## 2023-03-07 ENCOUNTER — Ambulatory Visit: Payer: 59 | Attending: Physician Assistant | Admitting: Physician Assistant

## 2023-03-07 ENCOUNTER — Encounter: Payer: Self-pay | Admitting: Physician Assistant

## 2023-03-07 VITALS — BP 116/80 | HR 80 | Ht 70.0 in | Wt 241.2 lb

## 2023-03-07 DIAGNOSIS — E785 Hyperlipidemia, unspecified: Secondary | ICD-10-CM

## 2023-03-07 DIAGNOSIS — I2510 Atherosclerotic heart disease of native coronary artery without angina pectoris: Secondary | ICD-10-CM | POA: Diagnosis not present

## 2023-03-07 DIAGNOSIS — Z789 Other specified health status: Secondary | ICD-10-CM | POA: Diagnosis not present

## 2023-03-07 NOTE — Patient Instructions (Signed)
Medication Instructions:  Your physician has recommended you make the following change in your medication:  1) STOP taking amlodipine   *If you need a refill on your cardiac medications before your next appointment, please call your pharmacy*  Lab Work: Your provider would like for you to return in 2 months to have the following labs drawn: Lipid Panel.   Please go to [facility] [address] [address], [place] [address] You do not need an appointment.  They are open from 7:30 am-4 pm.  Lunch from 1:00 pm- 2:00 pm You DO need to be fasting.  Follow-Up: At [facility], you and your health needs are our priority.  As part of our continuing mission to provide you with exceptional heart care, we have created designated Provider Care Teams.  These Care Teams include your primary Cardiologist (physician) and Advanced Practice Providers (APPs -  Physician Assistants and Nurse Practitioners) who all work together to provide you with the care you need, when you need it.  Your next appointment:   2 months  Provider:   Eula Listen, PA-C

## 2023-03-08 ENCOUNTER — Encounter: Payer: 59 | Admitting: *Deleted

## 2023-03-08 DIAGNOSIS — Z955 Presence of coronary angioplasty implant and graft: Secondary | ICD-10-CM

## 2023-03-08 NOTE — Progress Notes (Signed)
Daily Session Note  Patient Details  Name: Cassandra Robbins MRN: [id number] Date of Birth: 08-09-1970 Referring Provider:   Flowsheet Row Cardiac Rehab from 12/10/2022 in [facility] Cardiac and Pulmonary Rehab  Referring Provider Dr. [name], MD       Encounter Date: 03/08/2023  Check In:  Session Check In - 03/08/23 0928       Check-In   Supervising physician immediately available to respond to emergencies See telemetry face sheet for immediately available ER MD    Location [facility]-Cardiac & Pulmonary Rehab    Staff Present Cora Collum, RN, BSN, CCRP;Joseph Hood, RCP,RRT,BSRT;Noah Tickle, [place], Exercise Physiologist    Virtual Visit No    Medication changes reported     No    Fall or balance concerns reported    No    Warm-up and Cool-down Performed on first and last piece of equipment    Resistance Training Performed Yes    VAD Patient? No    PAD/SET Patient? No      Pain Assessment   Currently in Pain? No/denies                Social History   Tobacco Use  Smoking Status Never   Passive exposure: Never  Smokeless Tobacco Never    Goals Met:  Independence with exercise equipment Exercise tolerated well No report of concerns or symptoms today  Goals Unmet:  Not Applicable  Comments: Pt able to follow exercise prescription today without complaint.  Will continue to monitor for progression.    Dr. Bethann Punches is Medical Director for [facility] Cardiac Rehabilitation.  Dr. Vida Rigger is Medical Director for [facility] Pulmonary Rehabilitation.

## 2023-03-11 ENCOUNTER — Encounter: Payer: 59 | Admitting: *Deleted

## 2023-03-11 DIAGNOSIS — Z955 Presence of coronary angioplasty implant and graft: Secondary | ICD-10-CM | POA: Diagnosis not present

## 2023-03-11 NOTE — Progress Notes (Signed)
Daily Session Note  Patient Details  Name: Cassandra Robbins MRN: [id number] Date of Birth: 10/11/1970 Referring Provider:   Flowsheet Row Cardiac Rehab from 12/10/2022 in [facility] Cardiac and Pulmonary Rehab  Referring Provider Dr. [name], MD       Encounter Date: 03/11/2023  Check In:  Session Check In - 03/11/23 0929       Check-In   Supervising physician immediately available to respond to emergencies See telemetry face sheet for immediately available ER MD    Location [facility]-Cardiac & Pulmonary Rehab    Staff Present Cora Collum, RN, BSN, CCRP;Maxon Conetta BS, , Exercise Physiologist;Kelly Madilyn Fireman, BS, ACSM CEP, Exercise Physiologist;Noura Purpura Katrinka Blazing, RN, ADN    Virtual Visit No    Medication changes reported     No    Fall or balance concerns reported    No    Warm-up and Cool-down Performed on first and last piece of equipment    Resistance Training Performed Yes    VAD Patient? No    PAD/SET Patient? No      Pain Assessment   Currently in Pain? No/denies                Social History   Tobacco Use  Smoking Status Never   Passive exposure: Never  Smokeless Tobacco Never    Goals Met:  Independence with exercise equipment Exercise tolerated well No report of concerns or symptoms today Strength training completed today  Goals Unmet:  Not Applicable  Comments: Pt able to follow exercise prescription today without complaint.  Will continue to monitor for progression.    Dr. Bethann Punches is Medical Director for [facility] Cardiac Rehabilitation.  Dr. Vida Rigger is Medical Director for [facility] Pulmonary Rehabilitation.

## 2023-03-13 ENCOUNTER — Encounter: Payer: 59 | Admitting: *Deleted

## 2023-03-13 DIAGNOSIS — Z955 Presence of coronary angioplasty implant and graft: Secondary | ICD-10-CM

## 2023-03-13 NOTE — Progress Notes (Signed)
Daily Session Note  Patient Details  Name: Cassandra Robbins MRN: [id number] Date of Birth: [birth year] Referring Provider:   Flowsheet Row Cardiac Rehab from 12/10/2022 in [facility] Cardiac and Pulmonary Rehab  Referring Provider Dr. [name], MD       Encounter Date: 03/13/2023  Check In:  Session Check In - 03/13/23 0926       Check-In   Supervising physician immediately available to respond to emergencies See telemetry face sheet for immediately available ER MD    Location [facility]-Cardiac & Pulmonary Rehab    Staff Present Ronette Deter, BS, Exercise Physiologist;Joseph Reino Kent, Guinevere Ferrari, RN, ADN;Maxon [employer], , Exercise Physiologist    Virtual Visit No    Medication changes reported     No    Fall or balance concerns reported    No    Warm-up and Cool-down Performed on first and last piece of equipment    Resistance Training Performed Yes    VAD Patient? No    PAD/SET Patient? No      Pain Assessment   Currently in Pain? No/denies                Social History   Tobacco Use  Smoking Status Never   Passive exposure: Never  Smokeless Tobacco Never    Goals Met:  Independence with exercise equipment Exercise tolerated well No report of concerns or symptoms today Strength training completed today  Goals Unmet:  Not Applicable  Comments: Pt able to follow exercise prescription today without complaint.  Will continue to monitor for progression.    Dr. Bethann Punches is Medical Director for [facility] Cardiac Rehabilitation.  Dr. Vida Rigger is Medical Director for [facility] Pulmonary Rehabilitation.

## 2023-03-15 ENCOUNTER — Encounter: Payer: 59 | Admitting: *Deleted

## 2023-03-15 DIAGNOSIS — Z955 Presence of coronary angioplasty implant and graft: Secondary | ICD-10-CM

## 2023-03-15 NOTE — Progress Notes (Signed)
Daily Session Note  Patient Details  Name: Cassandra Robbins MRN: [id number] Date of Birth: 1970/12/25 Referring Provider:   Flowsheet Row Cardiac Rehab from 12/10/2022 in [facility] Cardiac and Pulmonary Rehab  Referring Provider Dr. [name], MD       Encounter Date: 03/15/2023  Check In:  Session Check In - 03/15/23 0943       Check-In   Supervising physician immediately available to respond to emergencies See telemetry face sheet for immediately available ER MD    Location [facility]-Cardiac & Pulmonary Rehab    Staff Present Cora Collum, RN, BSN, CCRP;Noah Tickle, BS, Exercise Physiologist;Joseph [place], [place]    Virtual Visit No    Medication changes reported     No    Fall or balance concerns reported    No    Warm-up and Cool-down Performed on first and last piece of equipment    Resistance Training Performed Yes    VAD Patient? No    PAD/SET Patient? No      Pain Assessment   Currently in Pain? No/denies                Social History   Tobacco Use  Smoking Status Never   Passive exposure: Never  Smokeless Tobacco Never    Goals Met:  Independence with exercise equipment Exercise tolerated well No report of concerns or symptoms today  Goals Unmet:  Not Applicable  Comments: Pt able to follow exercise prescription today without complaint.  Will continue to monitor for progression.    Dr. Bethann Punches is Medical Director for [facility] Cardiac Rehabilitation.  Dr. Vida Rigger is Medical Director for [facility] Pulmonary Rehabilitation.

## 2023-03-18 ENCOUNTER — Encounter: Payer: 59 | Admitting: *Deleted

## 2023-03-18 DIAGNOSIS — Z955 Presence of coronary angioplasty implant and graft: Secondary | ICD-10-CM

## 2023-03-18 NOTE — Progress Notes (Signed)
Daily Session Note  Patient Details  Name: Cassandra Robbins MRN: [id number] Date of Birth: 05-30-1970 Referring Provider:   Flowsheet Row Cardiac Rehab from 12/10/2022 in [facility] Cardiac and Pulmonary Rehab  Referring Provider Dr. [name], MD       Encounter Date: 03/18/2023  Check In:  Session Check In - 03/18/23 0942       Check-In   Supervising physician immediately available to respond to emergencies See telemetry face sheet for immediately available ER MD    Location [facility]-Cardiac & Pulmonary Rehab    Staff Present Rory Percy, MS, Exercise Physiologist;Krista Karleen Hampshire RN, Mabeline Caras, BS, ACSM CEP, Exercise Physiologist;Stevee Valenta Katrinka Blazing, RN, ADN    Virtual Visit No    Medication changes reported     No    Fall or balance concerns reported    No    Warm-up and Cool-down Performed on first and last piece of equipment    Resistance Training Performed Yes    VAD Patient? No    PAD/SET Patient? No      Pain Assessment   Currently in Pain? No/denies                Social History   Tobacco Use  Smoking Status Never   Passive exposure: Never  Smokeless Tobacco Never    Goals Met:  Independence with exercise equipment Exercise tolerated well No report of concerns or symptoms today Strength training completed today  Goals Unmet:  Not Applicable  Comments: Pt able to follow exercise prescription today without complaint.  Will continue to monitor for progression.    Dr. Bethann Punches is Medical Director for [facility] Cardiac Rehabilitation.  Dr. Vida Rigger is Medical Director for [facility] Pulmonary Rehabilitation.

## 2023-03-20 ENCOUNTER — Encounter: Payer: 59 | Admitting: *Deleted

## 2023-03-20 ENCOUNTER — Encounter: Payer: Self-pay | Admitting: *Deleted

## 2023-03-20 VITALS — Ht 71.0 in | Wt 241.8 lb

## 2023-03-20 DIAGNOSIS — Z955 Presence of coronary angioplasty implant and graft: Secondary | ICD-10-CM | POA: Diagnosis not present

## 2023-03-20 NOTE — Progress Notes (Signed)
Cardiac Individual Treatment Plan  Patient Details  Name: Cassandra Robbins MRN: [id number] Date of Birth: 1971-01-14 Referring Provider:   Flowsheet Row Cardiac Rehab from 12/10/2022 in [facility] Cardiac and Pulmonary Rehab  Referring Provider Dr. [name], MD       Initial Encounter Date:  Flowsheet Row Cardiac Rehab from 12/10/2022 in [facility] Cardiac and Pulmonary Rehab  Date 12/10/22       Visit Diagnosis: Status post coronary artery stent placement  Patient's Home Medications on Admission:  Current Outpatient Medications:    acetaminophen (TYLENOL) 500 MG tablet, Take 1,000 mg by mouth every 8 (eight) hours as needed for moderate pain., Disp: , Rfl:    amphetamine-dextroamphetamine (ADDERALL) 20 MG tablet, Take 1 tablet (20 mg total) by mouth 2 (two) times daily., Disp: 60 tablet, Rfl: 0   amphetamine-dextroamphetamine (ADDERALL) 20 MG tablet, Take 1 tablet (20 mg total) by mouth 2 (two) times daily., Disp: 60 tablet, Rfl: 0   amphetamine-dextroamphetamine (ADDERALL) 20 MG tablet, Take 1 tablet (20 mg total) by mouth 2 (two) times daily., Disp: 60 tablet, Rfl: 0   aspirin EC 81 MG tablet, Take 1 tablet (81 mg total) by mouth daily. Swallow whole., Disp: 90 tablet, Rfl: 3   cetirizine (ZYRTEC) 10 MG tablet, Take 10 mg by mouth daily., Disp: , Rfl:    clopidogrel (PLAVIX) 75 MG tablet, Take 1 tablet (75 mg total) by mouth daily with breakfast., Disp: 90 tablet, Rfl: 3   Evolocumab (REPATHA SURECLICK) 140 MG/ML SOAJ, Inject 140 mg into the skin every 14 (fourteen) days., Disp: 3 mL, Rfl: 7   fluticasone (FLONASE) 50 MCG/ACT nasal spray, Place 2 sprays into both nostrils daily., Disp: 16 g, Rfl: 6   ibuprofen (ADVIL) 200 MG tablet, Take 600 mg by mouth every 8 (eight) hours as needed for moderate pain., Disp: , Rfl:    ketoconazole (NIZORAL) 2 % cream, Apply 1 Application topically daily. (Patient not taking: Reported on 03/07/2023), Disp: 30 g, Rfl: 0   nitroGLYCERIN (NITROSTAT) 0.4 MG SL  tablet, Place 1 tablet (0.4 mg total) under the tongue every 5 (five) minutes as needed for chest pain., Disp: 25 tablet, Rfl: 3   pantoprazole (PROTONIX) 40 MG tablet, Take 1 tablet (40 mg total) by mouth daily., Disp: 90 tablet, Rfl: 1   sodium chloride (OCEAN) 0.65 % SOLN nasal spray, Place 1 spray into both nostrils as needed for congestion., Disp: , Rfl:   Past Medical History: 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HCC)    Toe fracture, right 10/08/2017    Tobacco Use: Social History   Tobacco Use  Smoking Status Never   Passive exposure: Never  Smokeless Tobacco Never    Labs: Review Flowsheet  More data exists      Latest Ref Rng & Units 11/30/2020 01/26/2021 01/08/2022 10/12/2022 12/17/2022  Labs for ITP Cardiac and Pulmonary Rehab  Cholestrol 0 - 200 mg/dL - 409  811  914  782   LDL (calc) 0 - 99 mg/dL - 956  213  086  74   Direct LDL 0 - 99 mg/dL - - - 578  86   HDL-C >46 mg/dL - 54  57  59  49   Trlycerides <150 mg/dL - 962  143  99  109   Hemoglobin A1c 4.8 - 5.6 % 5.9  - 6.1  - -    Details             Exercise Target Goals: Exercise Program Goal: Individual exercise prescription set using results from initial 6 min walk test and THRR while considering  patient's activity barriers and safety.   Exercise Prescription Goal: Initial exercise prescription builds to 30-45 minutes a day of aerobic activity, 2-3 days per week.  Home exercise guidelines will be given to patient during program as part of exercise prescription that the participant will acknowledge.   Education: Aerobic Exercise: - Group verbal and visual presentation on the components of exercise prescription. Introduces F.I.T.T principle from ACSM for exercise prescriptions.  Reviews F.I.T.T. principles of aerobic  exercise including progression. Written material given at graduation. Flowsheet Row Cardiac Rehab from 12/10/2022 in [facility] Cardiac and Pulmonary Rehab  Education need identified 12/10/22       Education: Resistance Exercise: - Group verbal and visual presentation on the components of exercise prescription. Introduces F.I.T.T principle from ACSM for exercise prescriptions  Reviews F.I.T.T. principles of resistance exercise including progression. Written material given at graduation.    Education: Exercise & Equipment Safety: - Individual verbal instruction and demonstration of equipment use and safety with use of the equipment. Flowsheet Row Cardiac Rehab from 12/10/2022 in [facility] Cardiac and Pulmonary Rehab  Date 12/10/22  Educator NT  Instruction Review Code 1- Verbalizes Understanding       Education: Exercise Physiology & General Exercise Guidelines: - Group verbal and written instruction with models to review the exercise physiology of the cardiovascular system and associated critical values. Provides general exercise guidelines with specific guidelines to those with heart or lung disease.    Education: Flexibility, Balance, Mind/Body Relaxation: - Group verbal and visual presentation with interactive activity on the components of exercise prescription. Introduces F.I.T.T principle from ACSM for exercise prescriptions. Reviews F.I.T.T. principles of flexibility and balance exercise training including progression. Also discusses the mind body connection.  Reviews various relaxation techniques to help reduce and manage stress (i.e. Deep breathing, progressive muscle relaxation, and visualization). Balance handout provided to take home. Written material given at graduation.   Activity Barriers & Risk Stratification:  Activity Barriers & Cardiac Risk Stratification - 12/10/22 1531       Activity Barriers & Cardiac Risk Stratification   Activity Barriers Arthritis    Cardiac Risk  Stratification High             6 Minute Walk:  6 Minute Walk     Row Name 12/10/22 1529 03/20/23 0937       6 Minute Walk   Phase Initial Discharge    Distance 1600 feet 1830 feet    Distance % Change -- 14.4 %    Distance Feet Change -- 230 ft    Walk Time 6 minutes 6 minutes    # of Rest Breaks 0 0    MPH 3.03 3.5    METS 4.49 5    RPE 13 12    Perceived Dyspnea  1 0    VO2 Peak 15.71 17.6    Symptoms No No    Resting HR 88 bpm 67 bpm    Resting BP 134/74 124/70    Resting Oxygen Saturation  100 % 97 %    Exercise Oxygen Saturation  during 6 min walk 96 % 95 %    Max Ex. HR 123 bpm 124  bpm    Max Ex. BP 166/72 172/70    2 Minute Post BP 148/72 --             Oxygen Initial Assessment:   Oxygen Re-Evaluation:   Oxygen Discharge (Final Oxygen Re-Evaluation):   Initial Exercise Prescription:  Initial Exercise Prescription - 12/10/22 1500       Date of Initial Exercise RX and Referring Provider   Date 12/10/22    Referring Provider Dr. [name], MD      Oxygen   Maintain Oxygen Saturation 88% or higher      Treadmill   MPH 2.6    Grade 0.5    Minutes 15    METs 3.17      NuStep   Level 3   T6 Nustep   SPM 80    Minutes 15    METs 4.49      REL-XR   Level 4    Speed 50    Minutes 15    METs 4.49      Prescription Details   Frequency (times per week) 2    Duration Progress to 30 minutes of continuous aerobic without signs/symptoms of physical distress      Intensity   THRR 40-80% of Max Heartrate 119-151    Ratings of Perceived Exertion 11-13    Perceived Dyspnea 0-4      Progression   Progression Continue to progress workloads to maintain intensity without signs/symptoms of physical distress.      Resistance Training   Training Prescription Yes    Weight 5 lb    Reps 10-15             Perform Capillary Blood Glucose checks as needed.  Exercise Prescription Changes:   Exercise Prescription Changes     Row  Name 12/10/22 1500 01/17/23 0800 01/31/23 1600 02/11/23 0900 02/14/23 0700     Response to Exercise   Blood Pressure (Admit) 134/74 118/68 124/70 -- 112/66   Blood Pressure (Exercise) 166/72 168/70 144/78 -- --   Blood Pressure (Exit) 148/72 124/62 104/62 -- 118/68   Heart Rate (Admit) 88 bpm 84 bpm 88 bpm -- 68 bpm   Heart Rate (Exercise) 128 bpm 153 bpm 156 bpm -- 143 bpm   Heart Rate (Exit) 95 bpm 116 bpm 102 bpm -- 107 bpm   Oxygen Saturation (Admit) 100 % -- -- -- --   Oxygen Saturation (Exercise) 96 % -- -- -- --   Rating of Perceived Exertion (Exercise) 13 14 13  -- 13   Perceived Dyspnea (Exercise) 1 -- 0 -- 0   Symptoms None none none -- none   Comments Results First two weeks of exercise First two weeks of exercise -- First two weeks of exercise   Duration -- Continue with 30 min of aerobic exercise without signs/symptoms of physical distress. Continue with 30 min of aerobic exercise without signs/symptoms of physical distress. -- Continue with 30 min of aerobic exercise without signs/symptoms of physical distress.   Intensity -- THRR unchanged THRR unchanged THRR unchanged THRR unchanged     Progression   Progression -- Continue to progress workloads to maintain intensity without signs/symptoms of physical distress. Continue to progress workloads to maintain intensity without signs/symptoms of physical distress. Continue to progress workloads to maintain intensity without signs/symptoms of physical distress. Continue to progress workloads to maintain intensity without signs/symptoms of physical distress.   Average METs -- 4.83 5.1 5.1 5.22     Resistance Training  Training Prescription -- Yes Yes Yes Yes   Weight -- 5 lb 6lb 6lb 6lb   Reps -- 10-15 10-15 10-15 10-15     Interval Training   Interval Training -- No No No No     Treadmill   MPH -- 3 4 4  3.4   Grade -- 3 3.2 3.2 4   Minutes -- 15 15 15 15    METs -- 4.54 5.83 5.83 5.48     NuStep   Level -- 4 -- -- --    Minutes -- 15 -- -- --   METs -- 5.6 -- -- --     Elliptical   Level -- 4 6 6 7    Speed -- 3 1 1  2.8   Minutes -- 15 15 15 15    METs -- -- 4.2 4.2 4.5     REL-XR   Level -- 5 8 8 7    Minutes -- 15 15 15 15    METs -- 6.1 6.7 6.7 7     Home Exercise Plan   Plans to continue exercise at -- -- -- Home (comment)  walking at home on TM --   Frequency -- -- -- Add 2 additional days to program exercise sessions. --   Initial Home Exercises Provided -- -- -- 02/11/23 --     Oxygen   Maintain Oxygen Saturation -- 88% or higher 88% or higher 88% or higher 88% or higher    Row Name 02/28/23 1500 03/12/23 1400           Response to Exercise   Blood Pressure (Admit) 120/74 104/68      Blood Pressure (Exit) 122/60 110/62      Heart Rate (Admit) 79 bpm 77 bpm      Heart Rate (Exercise) 150 bpm 142 bpm      Heart Rate (Exit) 98 bpm 106 bpm      Rating of Perceived Exertion (Exercise) 13 13      Perceived Dyspnea (Exercise) 0 0      Symptoms none none      Duration Continue with 30 min of aerobic exercise without signs/symptoms of physical distress. Continue with 30 min of aerobic exercise without signs/symptoms of physical distress.      Intensity THRR unchanged THRR unchanged        Progression   Progression Continue to progress workloads to maintain intensity without signs/symptoms of physical distress. Continue to progress workloads to maintain intensity without signs/symptoms of physical distress.      Average METs 6 4.9        Resistance Training   Training Prescription Yes Yes      Weight 6lb 6lb      Reps 10-15 10-15        Interval Training   Interval Training No No        Treadmill   MPH 3.5 4.5      Grade 4.5 2.8      Minutes 15 15      METs 5.85 6.18        Elliptical   Level 7 8      Speed 3 3      Minutes 15 15      METs 5 5.4        REL-XR   Level 8 8      Minutes 15 15      METs 7.4 6.7        Home Exercise Plan   Plans to continue  exercise at  Home (comment)  walking at home on TM Home (comment)  walking at home on TM      Frequency Add 2 additional days to program exercise sessions. Add 2 additional days to program exercise sessions.      Initial Home Exercises Provided 02/11/23 02/11/23        Oxygen   Maintain Oxygen Saturation 88% or higher 88% or higher               Exercise Comments:   Exercise Comments     Row Name 01/02/23 1020           Exercise Comments First full day of exercise!  Patient was oriented to gym and equipment including functions, settings, policies, and procedures.  Patient's individual exercise prescription and treatment plan were reviewed.  All starting workloads were established based on the results of the 6 minute walk test done at initial orientation visit.  The plan for exercise progression was also introduced and progression will be customized based on patient's performance and goals.                Exercise Goals and Review:   Exercise Goals     Row Name 12/10/22 1532             Exercise Goals   Increase Physical Activity Yes       Intervention Provide advice, education, support and counseling about physical activity/exercise needs.;Develop an individualized exercise prescription for aerobic and resistive training based on initial evaluation findings, risk stratification, comorbidities and participant's personal goals.       Expected Outcomes Short Term: Attend rehab on a regular basis to increase amount of physical activity.;Long Term: Add in home exercise to make exercise part of routine and to increase amount of physical activity.;Long Term: Exercising regularly at least 3-5 days a week.       Increase Strength and Stamina Yes       Intervention Provide advice, education, support and counseling about physical activity/exercise needs.;Develop an individualized exercise prescription for aerobic and resistive training based on initial evaluation findings, risk stratification,  comorbidities and participant's personal goals.       Expected Outcomes Short Term: Increase workloads from initial exercise prescription for resistance, speed, and METs.;Short Term: Perform resistance training exercises routinely during rehab and add in resistance training at home;Long Term: Improve cardiorespiratory fitness, muscular endurance and strength as measured by increased METs and functional capacity ( )       Able to understand and use rate of perceived exertion (RPE) scale Yes       Intervention Provide education and explanation on how to use RPE scale       Expected Outcomes Short Term: Able to use RPE daily in rehab to express subjective intensity level;Long Term:  Able to use RPE to guide intensity level when exercising independently       Able to understand and use Dyspnea scale Yes       Intervention Provide education and explanation on how to use Dyspnea scale       Expected Outcomes Short Term: Able to use Dyspnea scale daily in rehab to express subjective sense of shortness of breath during exertion;Long Term: Able to use Dyspnea scale to guide intensity level when exercising independently       Knowledge and understanding of Target Heart Rate Range (THRR) Yes       Intervention Provide education and explanation of THRR including how the numbers were predicted and  where they are located for reference       Expected Outcomes Short Term: Able to state/look up THRR;Long Term: Able to use THRR to govern intensity when exercising independently;Short Term: Able to use daily as guideline for intensity in rehab       Able to check pulse independently Yes       Intervention Provide education and demonstration on how to check pulse in carotid and radial arteries.;Review the importance of being able to check your own pulse for safety during independent exercise       Expected Outcomes Short Term: Able to explain why pulse checking is important during independent exercise;Long Term: Able to  check pulse independently and accurately       Understanding of Exercise Prescription Yes       Intervention Provide education, explanation, and written materials on patient's individual exercise prescription       Expected Outcomes Short Term: Able to explain program exercise prescription;Long Term: Able to explain home exercise prescription to exercise independently                Exercise Goals Re-Evaluation :  Exercise Goals Re-Evaluation     Row Name 01/02/23 1021 01/17/23 0844 01/23/23 0924 01/31/23 1641 02/11/23 0944     Exercise Goal Re-Evaluation   Exercise Goals Review Increase Physical Activity;Able to understand and use rate of perceived exertion (RPE) scale;Knowledge and understanding of Target Heart Rate Range (THRR);Understanding of Exercise Prescription;Increase Strength and Stamina;Able to check pulse independently Increase Physical Activity;Increase Strength and Stamina;Understanding of Exercise Prescription Increase Physical Activity;Increase Strength and Stamina;Understanding of Exercise Prescription Increase Physical Activity;Increase Strength and Stamina;Understanding of Exercise Prescription Increase Strength and Stamina;Increase Physical Activity;Able to understand and use rate of perceived exertion (RPE) scale;Able to understand and use Dyspnea scale;Knowledge and understanding of Target Heart Rate Range (THRR);Able to check pulse independently;Understanding of Exercise Prescription   Comments Reviewed RPE  and dyspnea scale, THR and program prescription with pt today.  Pt voiced understanding and was given a copy of goals to take home. Cianni is off to a good start in the program. She was able to increase her treadmill workload to a speed of 3 mph with a 3% incline during her first two weeks of rehab. She also improved to level 4 on the elliptical and level 5 on the XR. We will continue to monitor her progress in the program. Hatice states that she feels she is doing  well with exercise. She reports progressively increasing her workloads on the treadmill and XR. She also states that she feels better after exercise, and has not felt as fatigued. She also states that she has been walking 20 minutes a day on a treadmill at home on the days when she is not coming to rehab as well. We will continue to monitor her progress. Arielys continues to do well and show progress in rehab. She has recently increased her hand weights from 5 to 6 lb. On her exercise machines, she has increased her treadmill speed from to , and on the XR she increased her level from 5 to 8. We will continue to monitor her progress in the program. Reviewed home exercise with pt today.  Pt plans to walk at home on home TM for exercise.  Reviewed THR, pulse, RPE, sign and symptoms, pulse oximetery and when to call 911 or MD.  Also discussed weather considerations and indoor options.  Pt voiced understanding.   Expected Outcomes Short: Use RPE daily to  regulate intensity.  Long: Follow program prescription in THR. Short: Continue to progressively increase exercise workloads. Long: Continue exercise to improve strength and stamina. Short: Continue to walk on days away from rehab. Long: Continue to exercise independently. Short: Continue to follow current exercise prescription, and progressively increase workloads. Long: Continue exercise to improve strength and stamina. Short: add 1-2 days of exercise at home on off days of rehab. Long: Become independent with exercise routine.    Row Name 02/14/23 0704 02/28/23 1524 03/12/23 1451         Exercise Goal Re-Evaluation   Exercise Goals Review Increase Physical Activity;Increase Strength and Stamina;Understanding of Exercise Prescription Increase Physical Activity;Increase Strength and Stamina;Understanding of Exercise Prescription Increase Physical Activity;Increase Strength and Stamina;Understanding of Exercise Prescription     Comments Aliany continues  to do well in rehab. She has recently been able to increase her level on the elliptical from level 6 to 7, she also was able to increase her speed to 2.30mph. She continues to maintain her workload on the treadmill and the XR. We will continue to monitor her progress in the program. Ariday continues to do well in rehab. She has recently been able to increase her workload on the treadmill to a speed of 3.5 mph and a grade of 4.5%. She has been able to maintain her intensity on the elliptical and REXR. We will continue to monitor her progress in the program. Jherica continues to make improvements in rehab. She was recently able to increase her level on the elliptical from level 7 to 8. She was also able to increase her speed on the treadmill from 3. to 4.66mph. We will continue to monitor her progress in the program.     Expected Outcomes Short: Increase XR level to 8. Long: Continue to exercise to improve strength and stamina. Short: Continue to progressively increase treadmill workload. Long: Continue exercise to improve strength and stamina. Short: Continue to progress treadmill, and elliptical workloads. Long: Continue to exercise to improve strength and stamina.              Discharge Exercise Prescription (Final Exercise Prescription Changes):  Exercise Prescription Changes - 03/12/23 1400       Response to Exercise   Blood Pressure (Admit) 104/68    Blood Pressure (Exit) 110/62    Heart Rate (Admit) 77 bpm    Heart Rate (Exercise) 142 bpm    Heart Rate (Exit) 106 bpm    Rating of Perceived Exertion (Exercise) 13    Perceived Dyspnea (Exercise) 0    Symptoms none    Duration Continue with 30 min of aerobic exercise without signs/symptoms of physical distress.    Intensity THRR unchanged      Progression   Progression Continue to progress workloads to maintain intensity without signs/symptoms of physical distress.    Average METs 4.9      Resistance Training   Training  Prescription Yes    Weight 6lb    Reps 10-15      Interval Training   Interval Training No      Treadmill   MPH 4.5    Grade 2.8    Minutes 15    METs 6.18      Elliptical   Level 8    Speed 3    Minutes 15    METs 5.4      REL-XR   Level 8    Minutes 15    METs 6.7      Home  Exercise Plan   Plans to continue exercise at Home (comment)   walking at home on TM   Frequency Add 2 additional days to program exercise sessions.    Initial Home Exercises Provided 02/11/23      Oxygen   Maintain Oxygen Saturation 88% or higher             Nutrition:  Target Goals: Understanding of nutrition guidelines, daily intake of sodium 1500mg , cholesterol 200mg , calories 30% from fat and 7% or less from saturated fats, daily to have 5 or more servings of fruits and vegetables.  Education: All About Nutrition: -Group instruction provided by verbal, written material, interactive activities, discussions, models, and posters to present general guidelines for heart healthy nutrition including fat, fiber, MyPlate, the role of sodium in heart healthy nutrition, utilization of the nutrition label, and utilization of this knowledge for meal planning. Follow up email sent as well. Written material given at graduation.   Biometrics:  Pre Biometrics - 12/10/22 1533       Pre Biometrics   Height 5\' 11"  (1.803 m)    Weight 248 lb 9.6 oz (112.8 kg)    Waist Circumference 38.5 inches    Hip Circumference 49 inches    Waist to Hip Ratio 0.79 %    BMI (Calculated) 34.69    Single Leg Stand 30 seconds             Post Biometrics - 03/20/23 0942        Post  Biometrics   Height 5\' 11"  (1.803 m)    Weight 241 lb 12.8 oz (109.7 kg)    Waist Circumference 35 inches    Hip Circumference 48 inches    Waist to Hip Ratio 0.73 %    BMI (Calculated) 33.74    Single Leg Stand 30 seconds             Nutrition Therapy Plan and Nutrition Goals:  Nutrition Therapy & Goals - 12/10/22  1507       Nutrition Therapy   Diet Cardiac, low na    Protein (specify units) 90    Fiber 25 grams    Whole Grain Foods 3 servings    Saturated Fats 15 max. grams    Fruits and Vegetables 5 servings/day    Sodium 2 grams      Personal Nutrition Goals   Nutrition Goal Eat a complex carb with every meal    Personal Goal #2 Be consistent with breakfast    Personal Goal #3 Continue to watch portions and eat slowly    Comments Patient drinking 90-120oz of water. Cup of coffee daily. She tries to eat 3 times per day. Before her stent she was often missing breakfast and then overeating at lunch/dinner. Says that now she has to take medication with food in the morning she is better with eating 3 meals per day. Set goal to continue to work on consistency and make these changes more permanent. Talked about choosing complex carbs and pairing wit protein to help stay full and stabilize blood sugars. 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 Tries to avoid it. Built out several meals and snacks with non-inflammatory foods. Encouraged plant based protein like beans, lentils,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

## 2023-03-20 NOTE — Patient Instructions (Signed)
Discharge Patient Instructions  Patient Details  Name: Cassandra Robbins MRN: [id number] Date of Birth: [birth year] Referring Provider:  [name], DO   Number of Visits: 36  Reason for Discharge:  Patient reached a stable level of exercise. Patient independent in their exercise. Patient has met program and personal goals.  Diagnosis:  Status post coronary artery stent placement  Initial Exercise Prescription:  Initial Exercise Prescription - 12/10/22 1500       Date of Initial Exercise RX and Referring Provider   Date 12/10/22    Referring Provider Dr. [name], MD      Oxygen   Maintain Oxygen Saturation 88% or higher      Treadmill   MPH 2.6    Grade 0.5    Minutes 15    METs 3.17      NuStep   Level 3   T6 Nustep   SPM 80    Minutes 15    METs 4.49      REL-XR   Level 4    Speed 50    Minutes 15    METs 4.49      Prescription Details   Frequency (times per week) 2    Duration Progress to 30 minutes of continuous aerobic without signs/symptoms of physical distress      Intensity   THRR 40-80% of Max Heartrate 119-151    Ratings of Perceived Exertion 11-13    Perceived Dyspnea 0-4      Progression   Progression Continue to progress workloads to maintain intensity without signs/symptoms of physical distress.      Resistance Training   Training Prescription Yes    Weight 5 lb    Reps 10-15             Functional Capacity:  6 Minute Walk     Row Name 12/10/22 1529 03/20/23 0937       6 Minute Walk   Phase Initial Discharge    Distance 1600 feet 1830 feet    Distance % Change -- 14.4 %    Distance Feet Change -- 230 ft    Walk Time 6 minutes 6 minutes    # of Rest Breaks 0 0    MPH 3.03 3.5    METS 4.49 5    RPE 13 12    Perceived Dyspnea  1 0    VO2 Peak 15.71 17.6    Symptoms No No    Resting HR 88 bpm 67 bpm    Resting BP 134/74 124/70    Resting Oxygen Saturation  100 % 97 %    Exercise Oxygen Saturation  during 6  min walk 96 % 95 %    Max Ex. HR 123 bpm 124 bpm    Max Ex. BP 166/72 172/70    2 Minute Post BP 148/72 --            Nutrition & Weight - Outcomes:  Pre Biometrics - 12/10/22 1533       Pre Biometrics   Height 5\' 11"  (1.803 m)    Weight 248 lb 9.6 oz (112.8 kg)    Waist Circumference 38.5 inches    Hip Circumference 49 inches    Waist to Hip Ratio 0.79 %    BMI (Calculated) 34.69    Single Leg Stand 30 seconds             Post Biometrics - 03/20/23 3086        Post  Biometrics   Height 5\' 11"  (1.803 m)    Weight 241 lb 12.8 oz (109.7 kg)    Waist Circumference 35 inches    Hip Circumference 48 inches    Waist to Hip Ratio 0.73 %    BMI (Calculated) 33.74    Single Leg Stand 30 seconds             Goals reviewed with patient; copy given to patient.
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
## 2023-03-22 ENCOUNTER — Encounter: Payer: 59 | Admitting: *Deleted

## 2023-03-22 DIAGNOSIS — Z955 Presence of coronary angioplasty implant and graft: Secondary | ICD-10-CM

## 2023-03-22 NOTE — Progress Notes (Signed)
Daily Session Note  Patient Details  Name: Cassandra Robbins MRN: [id number] Date of Birth: 1970-10-26 Referring Provider:   Flowsheet Row Cardiac Rehab from 12/10/2022 in [facility] Cardiac and Pulmonary Rehab  Referring Provider Dr. [name], MD       Encounter Date: 03/22/2023  Check In:  Session Check In - 03/22/23 1002       Check-In   Supervising physician immediately available to respond to emergencies See telemetry face sheet for immediately available ER MD    Location [facility]-Cardiac & Pulmonary Rehab    Staff Present Cora Collum, RN, BSN, CCRP;Joseph Hood, RCP,RRT,BSRT;Noah Tickle, [place], Exercise Physiologist    Virtual Visit No    Medication changes reported     No    Fall or balance concerns reported    No    Warm-up and Cool-down Performed on first and last piece of equipment    Resistance Training Performed Yes    VAD Patient? No    PAD/SET Patient? No      Pain Assessment   Currently in Pain? No/denies                Social History   Tobacco Use  Smoking Status Never   Passive exposure: Never  Smokeless Tobacco Never    Goals Met:  Independence with exercise equipment Exercise tolerated well No report of concerns or symptoms today  Goals Unmet:  Not Applicable  Comments: Pt able to follow exercise prescription today without complaint.  Will continue to monitor for progression.    Dr. Bethann Punches is Medical Director for [facility] Cardiac Rehabilitation.  Dr. Vida Rigger is Medical Director for [facility] Pulmonary Rehabilitation.

## 2023-03-25 ENCOUNTER — Encounter: Payer: 59 | Admitting: *Deleted

## 2023-03-25 DIAGNOSIS — Z955 Presence of coronary angioplasty implant and graft: Secondary | ICD-10-CM | POA: Diagnosis not present

## 2023-03-25 NOTE — Progress Notes (Signed)
Daily Session Note  Patient Details  Name: Cassandra Robbins MRN: [id number] Date of Birth: 27-Feb-1971 Referring Provider:   Flowsheet Row Cardiac Rehab from 12/10/2022 in [facility] Cardiac and Pulmonary Rehab  Referring Provider Dr. [name], MD       Encounter Date: 03/25/2023  Check In:  Session Check In - 03/25/23 0926       Check-In   Supervising physician immediately available to respond to emergencies See telemetry face sheet for immediately available ER MD    Location [facility]-Cardiac & Pulmonary Rehab    Staff Present Cora Collum, RN, BSN, CCRP;Margaret Best, MS, Exercise Physiologist;Maxon Conetta BS, , Exercise Physiologist;Yostin Malacara Katrinka Blazing, RN, ADN    Virtual Visit No    Medication changes reported     No    Fall or balance concerns reported    No    Warm-up and Cool-down Performed on first and last piece of equipment    Resistance Training Performed Yes    VAD Patient? No    PAD/SET Patient? No      Pain Assessment   Currently in Pain? No/denies                Social History   Tobacco Use  Smoking Status Never   Passive exposure: Never  Smokeless Tobacco Never    Goals Met:  Independence with exercise equipment Exercise tolerated well No report of concerns or symptoms today Strength training completed today  Goals Unmet:  Not Applicable  Comments: Pt able to follow exercise prescription today without complaint.  Will continue to monitor for progression.    Dr. Bethann Punches is Medical Director for [facility] Cardiac Rehabilitation.  Dr. Vida Rigger is Medical Director for [facility] Pulmonary Rehabilitation.

## 2023-03-27 ENCOUNTER — Encounter: Payer: 59 | Admitting: *Deleted

## 2023-03-27 DIAGNOSIS — Z955 Presence of coronary angioplasty implant and graft: Secondary | ICD-10-CM | POA: Diagnosis not present

## 2023-03-27 NOTE — Progress Notes (Signed)
Daily Session Note  Patient Details  Name: Cassandra Robbins MRN: [id number] Date of Birth: 1971/02/14 Referring Provider:   Flowsheet Row Cardiac Rehab from 12/10/2022 in [facility] Cardiac and Pulmonary Rehab  Referring Provider Dr. [name], MD       Encounter Date: 03/27/2023  Check In:  Session Check In - 03/27/23 0929       Check-In   Supervising physician immediately available to respond to emergencies See telemetry face sheet for immediately available ER MD    Location [facility]-Cardiac & Pulmonary Rehab    Staff Present Rory Percy, MS, Exercise Physiologist;Susanne Bice, RN, BSN, CCRP;Maxon Conetta BS, , Exercise Physiologist;Freyja Govea Katrinka Blazing, RN, ADN    Virtual Visit No    Medication changes reported     No    Fall or balance concerns reported    No    Warm-up and Cool-down Performed on first and last piece of equipment    Resistance Training Performed Yes    VAD Patient? No    PAD/SET Patient? No      Pain Assessment   Currently in Pain? No/denies                Social History   Tobacco Use  Smoking Status Never   Passive exposure: Never  Smokeless Tobacco Never    Goals Met:  Independence with exercise equipment Exercise tolerated well No report of concerns or symptoms today Strength training completed today  Goals Unmet:  Not Applicable  Comments: Pt able to follow exercise prescription today without complaint.  Will continue to monitor for progression.    Dr. Bethann Punches is Medical Director for [facility] Cardiac Rehabilitation.  Dr. Vida Rigger is Medical Director for [facility] Pulmonary Rehabilitation.

## 2023-03-31 DIAGNOSIS — Z419 Encounter for procedure for purposes other than remedying health state, unspecified: Secondary | ICD-10-CM | POA: Diagnosis not present

## 2023-04-01 ENCOUNTER — Encounter: Payer: 59 | Attending: Cardiology | Admitting: *Deleted

## 2023-04-01 DIAGNOSIS — Z955 Presence of coronary angioplasty implant and graft: Secondary | ICD-10-CM | POA: Diagnosis not present

## 2023-04-01 NOTE — Progress Notes (Signed)
Daily Session Note  Patient Details  Name: Cassandra Robbins MRN: [id number] Date of Birth: 1971-04-25 Referring Provider:   Flowsheet Row Cardiac Rehab from 12/10/2022 in [facility] Cardiac and Pulmonary Rehab  Referring Provider Dr. [name], MD       Encounter Date: 04/01/2023  Check In:  Session Check In - 04/01/23 1004       Check-In   Supervising physician immediately available to respond to emergencies See telemetry face sheet for immediately available ER MD    Location [facility]-Cardiac & Pulmonary Rehab    Staff Present Rory Percy, MS, Exercise Physiologist;Kelly Madilyn Fireman, BS, ACSM CEP, Exercise Physiologist;Perlie Stene Katrinka Blazing, RN, ADN;Laureen Manson Passey, BS, RRT, CPFT    Virtual Visit No    Medication changes reported     No    Fall or balance concerns reported    No    Warm-up and Cool-down Performed on first and last piece of equipment    Resistance Training Performed Yes    VAD Patient? No    PAD/SET Patient? No      Pain Assessment   Currently in Pain? No/denies                Social History   Tobacco Use  Smoking Status Never   Passive exposure: Never  Smokeless Tobacco Never    Goals Met:  Independence with exercise equipment Exercise tolerated well No report of concerns or symptoms today Strength training completed today  Goals Unmet:  Not Applicable  Comments: Pt able to follow exercise prescription today without complaint.  Will continue to monitor for progression.    Dr. Bethann Punches is Medical Director for [facility] Cardiac Rehabilitation.  Dr. Vida Rigger is Medical Director for [facility] Pulmonary Rehabilitation.

## 2023-04-03 ENCOUNTER — Encounter: Payer: Self-pay | Admitting: Family Medicine

## 2023-04-03 ENCOUNTER — Ambulatory Visit (INDEPENDENT_AMBULATORY_CARE_PROVIDER_SITE_OTHER): Payer: 59 | Admitting: Family Medicine

## 2023-04-03 VITALS — BP 121/75 | HR 71 | Ht 70.0 in | Wt 245.4 lb

## 2023-04-03 DIAGNOSIS — I2510 Atherosclerotic heart disease of native coronary artery without angina pectoris: Secondary | ICD-10-CM

## 2023-04-03 DIAGNOSIS — Z9861 Coronary angioplasty status: Secondary | ICD-10-CM

## 2023-04-03 DIAGNOSIS — Z0000 Encounter for general adult medical examination without abnormal findings: Secondary | ICD-10-CM | POA: Diagnosis not present

## 2023-04-03 DIAGNOSIS — M609 Myositis, unspecified: Secondary | ICD-10-CM

## 2023-04-03 DIAGNOSIS — F909 Attention-deficit hyperactivity disorder, unspecified type: Secondary | ICD-10-CM

## 2023-04-03 DIAGNOSIS — Z6835 Body mass index (BMI) 35.0-35.9, adult: Secondary | ICD-10-CM

## 2023-04-03 DIAGNOSIS — T466X5A Adverse effect of antihyperlipidemic and antiarteriosclerotic drugs, initial encounter: Secondary | ICD-10-CM | POA: Diagnosis not present

## 2023-04-03 DIAGNOSIS — Z23 Encounter for immunization: Secondary | ICD-10-CM

## 2023-04-03 DIAGNOSIS — F331 Major depressive disorder, recurrent, moderate: Secondary | ICD-10-CM | POA: Diagnosis not present

## 2023-04-03 DIAGNOSIS — E782 Mixed hyperlipidemia: Secondary | ICD-10-CM

## 2023-04-03 LAB — BAYER DCA HB A1C WAIVED: HB A1C (BAYER DCA - WAIVED): 6.2 % — ABNORMAL HIGH (ref 4.8–5.6)

## 2023-04-03 MED ORDER — SEMAGLUTIDE-WEIGHT MANAGEMENT 0.5 MG/0.5ML ~~LOC~~ SOAJ: 0.5000 mg | SUBCUTANEOUS | 1 refills | Status: AC

## 2023-04-03 MED ORDER — AMPHETAMINE-DEXTROAMPHETAMINE 20 MG PO TABS: 20.0000 mg | ORAL_TABLET | Freq: Two times a day (BID) | ORAL | 0 refills | Status: DC

## 2023-04-03 MED ORDER — SEMAGLUTIDE-WEIGHT MANAGEMENT 0.25 MG/0.5ML ~~LOC~~ SOAJ: 0.2500 mg | SUBCUTANEOUS | 1 refills | Status: AC

## 2023-04-03 MED ORDER — AMPHETAMINE-DEXTROAMPHETAMINE 20 MG PO TABS
20.0000 mg | ORAL_TABLET | Freq: Two times a day (BID) | ORAL | 0 refills | Status: DC
Start: 2023-04-17 — End: 2023-07-11

## 2023-04-03 NOTE — Progress Notes (Unsigned)
BP 121/75   Pulse 71   Ht 5\' 10"  (1.778 m)   Wt 245 lb 6.4 oz (111.3 kg)   SpO2 99%   BMI 35.21 kg/m    Subjective:    Patient ID: Cassandra Robbins, female    DOB: 1970/10/23, 52 y.o.   MRN: [id number]  HPI: Cassandra Robbins is a 52 y.o. female presenting on 04/03/2023 for comprehensive medical examination. Current medical complaints include:  OBESITY Duration: chronic- worse recently Previous attempts at weight loss: yes, doing diet and exercise, working out 5 hours a week Complications of obesity: CAD, HLD, depression Peak weight: 255lb Weight loss goal: under 200 Weight loss to date: 10lbs Requesting obesity pharmacotherapy: unsure Current weight loss supplements/medications: no Previous weight loss supplements/meds: no  DEPRESSION Mood status: stable Satisfied with current treatment?: yes Symptom severity: mild  Duration of current treatment : not on anything Psychotherapy/counseling: no  Depressed mood: no Anxious mood: no Anhedonia: no Significant weight loss or gain: yes Insomnia: no  Fatigue: no Feelings of worthlessness or guilt: no Impaired concentration/indecisiveness: no Suicidal ideations: no Hopelessness: no Crying spells: no    04/03/2023    9:30 AM 01/17/2023   10:13 AM 12/10/2022    3:39 PM 10/02/2022    2:43 PM 08/06/2022    2:00 PM  Depression screen PHQ 2/9  Decreased Interest 1 1 0 1 1  Down, Depressed, Hopeless 1 1 0 1 1  PHQ - 2 Score 2 2 0 2 2  Altered sleeping 1 0 0 0 2  Tired, decreased energy 1 0 0 1 2  Change in appetite 1 1 0 0 2  Feeling bad or failure about yourself  0 0 0 0 1  Trouble concentrating 0 0 0 0 1  Moving slowly or fidgety/restless 0 0 0 0 1  Suicidal thoughts 0 0 0 0 1  PHQ-9 Score 5 3 0 3 12  Difficult doing work/chores Somewhat difficult Not difficult at all  Not difficult at all Somewhat difficult   HYPERLIPIDEMIA Hyperlipidemia status: excellent compliance Satisfied with current treatment?  yes Side effects:   no Medication compliance: excellent compliance Past cholesterol meds: repatha Supplements: none Aspirin:  no The 10-year ASCVD risk score (Arnett DK, et al., 2019) is: 1.2%   Values used to calculate the score:     Age: 45 years     Sex: Female     Is Non-Hispanic African American: No     Diabetic: No     Tobacco smoker: No     Systolic Blood Pressure: 121 mmHg     Is BP treated: No     HDL Cholesterol: 57 mg/dL     Total Cholesterol: 173 mg/dL Chest pain:  no Coronary artery disease:  yes Family history CAD:  yes  ADHD FOLLOW UP ADHD status: controlled Satisfied with current therapy: yes Medication compliance:  excellent compliance Controlled substance contract: yes Previous psychiatry evaluation: no Previous medications: no    Taking meds on weekends/vacations: yes Work/school performance:  excellent Difficulty sustaining attention/completing tasks: no Distracted by extraneous stimuli: no Does not listen when spoken to: no  Fidgets with hands or feet: no Unable to stay in seat: no Blurts out/interrupts others: no ADHD Medication Side Effects: no    Decreased appetite: no    Headache: no    Sleeping disturbance pattern: no    Irritability: no    Rebound effects (worse than baseline) off medication: no    Anxiousness: no  Dizziness: no    Tics: no  Clinical coverage for weight loss GLP's   Medication being dispensed is Wegovy 3 mL/28 day. Titration doses are 2 mL/28 days.   [x]  Product being prescribed is FDA approved for the indication, age, weight (if applicable) and not does not exceed dosing limits per the Prescribing Information per the clinical conditions for use.  [x]  Patient's baseline weight measured within the last 45 days as required by provider before dispensing.  [x]  Patient is new to therapy and One of the following:  [x]  The beneficiary is 53 years of age or over and has ONE of the following:  [x]  A BMI greater than or equal to 30  kg/m2  []  A BMI greater than or equal to 27 kg/m2 with at least one weight-related comorbidity/risk factor/complication (i.e. hypertension, type 2 diabetes, obstructive sleep apnea, cardiovascular disease, dyslipidemia)  [x]  If patient has one weight-related comorbidity/risk factor/complication (i.e. hypertension, type 2 diabetes, obstructive sleep apnea, cardiovascular disease, dyslipidemia), please list CAD, atypical angina. HLD, depression Patient suffers from weight-related comorbidity/risk factor/complication CAD, atypical angina. HLD, depression  [x]  The beneficiary is 66 years of age or older with a BMI greater than or equal to 27 kg/m2 AND has established cardiovascular disease (CVD) defined as having a history of myocardial infarction, stroke, or symptomatic peripheral disease, to be documented on the PA form. AND  [x]  The beneficiary is currently on and will continue lifestyle modification including structured nutrition and physical activity, unless physical activity is not clinically appropriate at the time GLP1 therapy commences AND  [x]  The beneficiary will NOT be using the requested agent in combination with another GLP-1 receptor agonist agent AND  [x]  The beneficiary does NOT have any FDA-labeled contraindications to the requested agent, including pregnancy, lactation, history of medullary thyroid cancer or multiple endocrine neoplasia type II.   Last height recorded  Ht 5\' 10"  (1.778 m)  Last weight recorded  Last Weight  Most recent update: 04/03/2023  9:23 AM    Weight  111.3 kg (245 lb 6.4 oz)              Last BMI recorded Estimated body mass index is 35.21 kg/m as calculated from the following:   Height as of this encounter: 5\' 10"  (1.778 m).   Weight as of this encounter: 245 lb 6.4 oz (111.3 kg).     Menopausal Symptoms: no  Depression Screen done today and results listed below:     04/03/2023    9:30 AM 01/17/2023   10:13 AM 12/10/2022    3:39 PM  10/02/2022    2:43 PM 08/06/2022    2:00 PM  Depression screen PHQ 2/9  Decreased Interest 1 1 0 1 1  Down, Depressed, Hopeless 1 1 0 1 1  PHQ - 2 Score 2 2 0 2 2  Altered sleeping 1 0 0 0 2  Tired, decreased energy 1 0 0 1 2  Change in appetite 1 1 0 0 2  Feeling bad or failure about yourself  0 0 0 0 1  Trouble concentrating 0 0 0 0 1  Moving slowly or fidgety/restless 0 0 0 0 1  Suicidal thoughts 0 0 0 0 1  PHQ-9 Score 5 3 0 3 12  Difficult doing work/chores Somewhat difficult Not difficult at all  Not difficult at all Somewhat difficult    Past Medical History:  Past Medical History:  Diagnosis Date   Adult ADHD    Attention deficit disorder  02/02/2021   Conductive hearing loss in left ear    Conductive hearing loss in right ear    COVID 03/21/2020   Depression    Still's disease (HCC)    Toe fracture, right 10/08/2017    Surgical History:  Past Surgical History:  Procedure Laterality Date   COLONOSCOPY WITH PROPOFOL N/A 02/21/2022   Procedure: COLONOSCOPY WITH PROPOFOL;  Surgeon: Toney Reil, MD;  Location: [facility] ENDOSCOPY;  Service: Gastroenterology;  Laterality: N/A;   CORONARY STENT INTERVENTION N/A 10/19/2022   Procedure: CORONARY STENT INTERVENTION;  Surgeon: Marykay Lex, MD;  Location: [facility] INVASIVE CV LAB;  Service: Cardiovascular;  Laterality: N/A;   LAPAROSCOPIC ENDOMETRIOSIS FULGURATION  2007   LEFT HEART CATH AND CORONARY ANGIOGRAPHY N/A 10/19/2022   Procedure: LEFT HEART CATH AND CORONARY ANGIOGRAPHY;  Surgeon: Marykay Lex, MD;  Location[facility] INVASIVE CV LAB;  Service: Cardiovascular;  Laterality: N/A;   SEPTOPLASTY  2005   TUBAL LIGATION      Medications:  Current Outpatient Medications on File Prior to Visit  Medication Sig   acetaminophen (TYLENOL) 500 MG tablet Take 1,000 mg by mouth every 8 (eight) hours as needed for moderate pain.   aspirin EC 81 MG tablet Take 1 tablet (81 mg total) by mouth daily. Swallow whole.   cetirizine (ZYRTEC)  10 MG tablet Take 10 mg by mouth daily.   clopidogrel (PLAVIX) 75 MG tablet Take 1 tablet (75 mg total) by mouth daily with breakfast.   Evolocumab (REPATHA SURECLICK) 140 MG/ML SOAJ Inject 140 mg into the skin every 14 (fourteen) days.   fluticasone (FLONASE) 50 MCG/ACT nasal spray Place 2 sprays into both nostrils daily.   ibuprofen (ADVIL) 200 MG tablet Take 600 mg by mouth every 8 (eight) hours as needed for moderate pain.   nitroGLYCERIN (NITROSTAT) 0.4 MG SL tablet Place 1 tablet (0.4 mg total) under the tongue every 5 (five) minutes as needed for chest pain.   pantoprazole (PROTONIX) 40 MG tablet Take 1 tablet (40 mg total) by mouth daily.   sodium chloride (OCEAN) 0.65 % SOLN nasal spray Place 1 spray into both nostrils as needed for congestion.   ketoconazole (NIZORAL) 2 % cream Apply 1 Application topically daily. (Patient not taking: Reported on 03/07/2023)   No current facility-administered medications on file prior to visit.    Allergies:  Allergies  Allergen Reactions   Penicillins Anaphylaxis   Amoxicillin Rash   Morphine Itching    After third dose "itching"    Social History:  Social History   Socioeconomic History   Marital status: Divorced    Spouse name: Not on file   Number of children: Not on file   Years of education: Not on file   Highest education level: Bachelor's degree (e.g., BA, AB, BS)  Occupational History   Occupation: paramedic  Tobacco Use   Smoking status: Never    Passive exposure: Never   Smokeless tobacco: Never  Vaping Use   Vaping status: Never Used  Substance and Sexual Activity   Alcohol use: Yes    Alcohol/week: 2.0 - 4.0 standard drinks of alcohol    Types: 1 - 2 Glasses of wine, 1 - 2 Shots of liquor per week    Comment: on occasion   Drug use: No   Sexual activity: Yes    Birth control/protection: Other-see comments    Comment: Tibal ligation  Other Topics Concern   Not on file  Social History Narrative   She has been  a  paramedic for 32 years.     Social Determinants of Health   Financial Resource Strain: Low Risk  (04/01/2023)   Overall Financial Resource Strain (CARDIA)    Difficulty of Paying Living Expenses: Not hard at all  Food Insecurity: No Food Insecurity (04/01/2023)   Hunger Vital Sign    Worried About Running Out of Food in the Last Year: Never true    Ran Out of Food in the Last Year: Never true  Transportation Needs: No Transportation Needs (04/01/2023)   PRAPARE - Administrator, Civil Service (Medical): No    Lack of Transportation (Non-Medical): No  Physical Activity: Sufficiently Active (04/01/2023)   Exercise Vital Sign    Days of Exercise per Week: 4 days    Minutes of Exercise per Session: 60 min  Stress: No Stress Concern Present (04/01/2023)   [employer] of Occupational Health - Occupational Stress Questionnaire    Feeling of Stress : Only a little  Social Connections: Socially Integrated (04/01/2023)   Social Connection and Isolation Panel [NHANES]    Frequency of Communication with Friends and Family: More than three times a week    Frequency of Social Gatherings with Friends and Family: More than three times a week    Attends Religious Services: More than 4 times per year    Active Member of [employer] or Organizations: Yes    Attends Engineer, structural: More than 4 times per year    Marital Status: Living with partner  Intimate Partner Violence: Not on file   Social History   Tobacco Use  Smoking Status Never   Passive exposure: Never  Smokeless Tobacco Never   Social History   Substance and Sexual Activity  Alcohol Use Yes   Alcohol/week: 2.0 - 4.0 standard drinks of alcohol   Types: 1 - 2 Glasses of wine, 1 - 2 Shots of liquor per week   Comment: on occasion    Family History:  Family History  Problem Relation Age of Onset   Asthma Mother    Lung disease Mother    Thyroid disease Mother    Cancer Father 52       Nasopharyngeal    Lymphoma Sister    Rheum arthritis Sister    Pulmonary fibrosis Maternal Grandfather    Breast cancer Neg Hx     Past medical history, surgical history, medications, allergies, family history and social history reviewed with patient today and changes made to appropriate areas of the chart.   Review of Systems  Constitutional:  Positive for diaphoresis. Negative for chills, fever, malaise/fatigue and weight loss.  HENT: Negative.    Eyes: Negative.   Respiratory: Negative.    Cardiovascular: Negative.   Gastrointestinal:  Positive for heartburn (with food choices). Negative for abdominal pain, blood in stool, constipation, diarrhea, melena, nausea and vomiting.  Genitourinary: Negative.   Musculoskeletal: Negative.   Skin: Negative.   Neurological: Negative.   Endo/Heme/Allergies:  Negative for environmental allergies and polydipsia. Bruises/bleeds easily.  Psychiatric/Behavioral: Negative.     All other ROS negative except what is listed above and in the HPI.      Objective:    BP 121/75   Pulse 71   Ht 5\' 10"  (1.778 m)   Wt 245 lb 6.4 oz (111.3 kg)   SpO2 99%   BMI 35.21 kg/m   Wt Readings from Last 3 Encounters:  04/03/23 245 lb 6.4 oz (111.3 kg)  03/20/23 241 lb 12.8 oz (109.7  kg)  03/07/23 241 lb 3.2 oz (109.4 kg)    Physical Exam Vitals and nursing note reviewed.  Constitutional:      General: She is not in acute distress.    Appearance: Normal appearance. She is obese. She is not ill-appearing, toxic-appearing or diaphoretic.  HENT:     Head: Normocephalic and atraumatic.     Right Ear: Tympanic membrane, ear canal and external ear normal. There is no impacted cerumen.     Left Ear: Tympanic membrane, ear canal and external ear normal. There is no impacted cerumen.     Nose: Nose normal. No congestion or rhinorrhea.     Mouth/Throat:     Mouth: Mucous membranes are moist.     Pharynx: Oropharynx is clear. No oropharyngeal exudate or posterior oropharyngeal  erythema.  Eyes:     General: No scleral icterus.       Right eye: No discharge.        Left eye: No discharge.     Extraocular Movements: Extraocular movements intact.     Conjunctiva/sclera: Conjunctivae normal.     Pupils: Pupils are equal, round, and reactive to light.  Neck:     Vascular: No carotid bruit.  Cardiovascular:     Rate and Rhythm: Normal rate and regular rhythm.     Pulses: Normal pulses.     Heart sounds: No murmur heard.    No friction rub. No gallop.  Pulmonary:     Effort: Pulmonary effort is normal. No respiratory distress.     Breath sounds: Normal breath sounds. No stridor. No wheezing, rhonchi or rales.  Chest:     Chest wall: No tenderness.  Abdominal:     General: Abdomen is flat. Bowel sounds are normal. There is no distension.     Palpations: Abdomen is soft. There is no mass.     Tenderness: There is no abdominal tenderness. There is no right CVA tenderness, left CVA tenderness, guarding or rebound.     Hernia: No hernia is present.  Genitourinary:    Comments: Breast and pelvic exams deferred with shared decision making Musculoskeletal:        General: No swelling, tenderness, deformity or signs of injury.     Cervical back: Normal range of motion and neck supple. No rigidity. No muscular tenderness.     Right lower leg: No edema.     Left lower leg: No edema.  Lymphadenopathy:     Cervical: No cervical adenopathy.  Skin:    General: Skin is warm and dry.     Capillary Refill: Capillary refill takes less than 2 seconds.     Coloration: Skin is not jaundiced or pale.     Findings: No bruising, erythema, lesion or rash.  Neurological:     General: No focal deficit present.     Mental Status: She is alert and oriented to person, place, and time. Mental status is at baseline.     Cranial Nerves: No cranial nerve deficit.     Sensory: No sensory deficit.     Motor: No weakness.     Coordination: Coordination normal.     Gait: Gait normal.      Deep Tendon Reflexes: Reflexes normal.  Psychiatric:        Mood and Affect: Mood normal.        Behavior: Behavior normal.        Thought Content: Thought content normal.        Judgment: Judgment normal.  Results for orders placed or performed in visit on 04/03/23  Bayer DCA Hb A1c Waived  Result Value Ref Range   HB A1C (BAYER DCA - WAIVED) 6.2 (H) 4.8 - 5.6 %  CBC with Differential/Platelet  Result Value Ref Range   WBC 6.3 3.4 - 10.8 x10E3/uL   RBC 4.91 3.77 - 5.28 x10E6/uL   Hemoglobin 14.1 11.1 - 15.9 g/dL   Hematocrit 40.9 81.1 - 46.6 %   MCV 89 79 - 97 fL   MCH 28.7 26.6 - 33.0 pg   MCHC 32.2 31.5 - 35.7 g/dL   RDW 91.4 78.2 - 95.6 %   Platelets 325 150 - 450 x10E3/uL   Neutrophils 54 Not Estab. %   Lymphs 34 Not Estab. %   Monocytes 6 Not Estab. %   Eos 5 Not Estab. %   Basos 1 Not Estab. %   Neutrophils Absolute 3.4 1.4 - 7.0 x10E3/uL   Lymphocytes Absolute 2.1 0.7 - 3.1 x10E3/uL   Monocytes Absolute 0.4 0.1 - 0.9 x10E3/uL   EOS (ABSOLUTE) 0.3 0.0 - 0.4 x10E3/uL   Basophils Absolute 0.1 0.0 - 0.2 x10E3/uL   Immature Granulocytes 0 Not Estab. %   Immature Grans (Abs) 0.0 0.0 - 0.1 x10E3/uL  Comprehensive metabolic panel  Result Value Ref Range   Glucose 101 (H) 70 - 99 mg/dL   BUN 13 6 - 24 mg/dL   Creatinine, Ser 2.13 0.57 - 1.00 mg/dL   eGFR 95 >08 MV/HQI/6.96   BUN/Creatinine Ratio 17 9 - 23   Sodium 141 134 - 144 mmol/L   Potassium 4.9 3.5 - 5.2 mmol/L   Chloride 101 96 - 106 mmol/L   CO2 23 20 - 29 mmol/L   Calcium 9.3 8.7 - 10.2 mg/dL   Total Protein 7.3 6.0 - 8.5 g/dL   Albumin 4.5 3.8 - 4.9 g/dL   Globulin, Total 2.8 1.5 - 4.5 g/dL   Bilirubin Total 0.3 0.0 - 1.2 mg/dL   Alkaline Phosphatase 146 (H) 44 - 121 IU/L   AST 22 0 - 40 IU/L   ALT 23 0 - 32 IU/L  Lipid Panel w/o Chol/HDL Ratio  Result Value Ref Range   Cholesterol, Total 173 100 - 199 mg/dL   Triglycerides 295 0 - 149 mg/dL   HDL 57 >28 mg/dL   VLDL Cholesterol Cal 21 5 - 40  mg/dL   LDL Chol Calc (NIH) 95 0 - 99 mg/dL  TSH  Result Value Ref Range   TSH 1.360 0.450 - 4.500 uIU/mL      Assessment & Plan:   Problem List Items Addressed This Visit       Cardiovascular and Mediastinum   CAD S/P percutaneous coronary angioplasty    Has been exercising and working on diet without significant benefit. Given coronary artery disease, would benefit from wegovy. Rx sent today. Follow up 3 months after starting meds.         Musculoskeletal and Integument   Statin-induced myositis    Unable to tolerate statins. Tolerating her repatha well. Rechecking labs today. Await results. Treat as needed.         Other   BMI 35.0-35.9,adult (Chronic)    Has been exercising and working on diet without significant benefit. Given coronary artery disease, would benefit from wegovy. Rx sent today. Follow up 3 months after starting meds.       Hyperlipidemia (Chronic)    Under good control on current regimen. Continue current regimen. Continue to monitor.  Call with any concerns. Refills given. Labs drawn today.       Adult ADHD    Under good control on current regimen. Continue current regimen. Continue to monitor. Call with any concerns. Refills given for 3 months. Follow up 3 months.        Relevant Medications   amphetamine-dextroamphetamine (ADDERALL) 20 MG tablet (Start on 04/17/2023)   Moderate recurrent major depression (HCC)    Doing well not on medicine. Continue to monitor. Call with any concerns.        Other Visit Diagnoses     Routine general medical examination at a health care facility    -  Primary   Vaccines up to date. Screening labs checked today. Pap up to date. Continue diet and exercise. Call with any concerns. Continue to monitor.   Relevant Orders   Bayer DCA Hb A1c Waived (Completed)   CBC with Differential/Platelet (Completed)   Comprehensive metabolic panel (Completed)   Lipid Panel w/o Chol/HDL Ratio (Completed)   TSH (Completed)    Needs flu shot       Flu shot given today.   Relevant Orders   Flu vaccine trivalent PF, 6mos and older(Flulaval,Afluria,Fluarix,Fluzone) (Completed)        Follow up plan: Return 6-8 weeks after starting wegovy.   LABORATORY TESTING:  - Pap smear: up to date  IMMUNIZATIONS:   - Tdap: Tetanus vaccination status reviewed: last tetanus booster within 10 years. - Influenza: Up to date - Pneumovax: Not applicable - Prevnar: Not applicable - COVID: Up to date - HPV: Not applicable - Shingrix vaccine: Up to date  SCREENING: -Mammogram: Up to date  - Colonoscopy: Up to date   PATIENT COUNSELING:   Advised to take 1 mg of folate supplement per day if capable of pregnancy.   Sexuality: Discussed sexually transmitted diseases, partner selection, use of condoms, avoidance of unintended pregnancy  and contraceptive alternatives.   Advised to avoid cigarette smoking.  I discussed with the patient that most people either abstain from alcohol or drink within safe limits (<=14/week and <=4 drinks/occasion for males, <=7/weeks and <= 3 drinks/occasion for females) and that the risk for alcohol disorders and other health effects rises proportionally with the number of drinks per week and how often a drinker exceeds daily limits.  Discussed cessation/primary prevention of drug use and availability of treatment for abuse.   Diet: Encouraged to adjust caloric intake to maintain  or achieve ideal body weight, to reduce intake of dietary saturated fat and total fat, to limit sodium intake by avoiding high sodium foods and not adding table salt, and to maintain adequate dietary potassium and calcium preferably from fresh fruits, vegetables, and low-fat dairy products.    stressed the importance of regular exercise  Injury prevention: Discussed safety belts, safety helmets, smoke detector, smoking near bedding or upholstery.   Dental health: Discussed importance of regular tooth brushing,  flossing, and dental visits.    NEXT PREVENTATIVE PHYSICAL DUE IN 1 YEAR. Return 6-8 weeks after starting wegovy.

## 2023-04-04 ENCOUNTER — Encounter: Payer: Self-pay | Admitting: Family Medicine

## 2023-04-04 ENCOUNTER — Telehealth: Payer: Self-pay

## 2023-04-04 LAB — COMPREHENSIVE METABOLIC PANEL
ALT: 23 [IU]/L (ref 0–32)
AST: 22 [IU]/L (ref 0–40)
Albumin: 4.5 g/dL (ref 3.8–4.9)
Alkaline Phosphatase: 146 [IU]/L — ABNORMAL HIGH (ref 44–121)
BUN/Creatinine Ratio: 17 (ref 9–23)
BUN: 13 mg/dL (ref 6–24)
Bilirubin Total: 0.3 mg/dL (ref 0.0–1.2)
CO2: 23 mmol/L (ref 20–29)
Calcium: 9.3 mg/dL (ref 8.7–10.2)
Chloride: 101 mmol/L (ref 96–106)
Creatinine, Ser: 0.75 mg/dL (ref 0.57–1.00)
Globulin, Total: 2.8 g/dL (ref 1.5–4.5)
Glucose: 101 mg/dL — ABNORMAL HIGH (ref 70–99)
Potassium: 4.9 mmol/L (ref 3.5–5.2)
Sodium: 141 mmol/L (ref 134–144)
Total Protein: 7.3 g/dL (ref 6.0–8.5)
eGFR: 95 mL/min/{1.73_m2} (ref >59)

## 2023-04-04 LAB — TSH: TSH: 1.36 u[IU]/mL (ref 0.450–4.500)

## 2023-04-04 LAB — CBC WITH DIFFERENTIAL/PLATELET
Basophils Absolute: 0.1 10*3/uL (ref 0.0–0.2)
Basos: 1 %
EOS (ABSOLUTE): 0.3 10*3/uL (ref 0.0–0.4)
Eos: 5 %
Hematocrit: 43.8 % (ref 34.0–46.6)
Hemoglobin: 14.1 g/dL (ref 11.1–15.9)
Immature Grans (Abs): 0 10*3/uL (ref 0.0–0.1)
Immature Granulocytes: 0 %
Lymphocytes Absolute: 2.1 10*3/uL (ref 0.7–3.1)
Lymphs: 34 %
MCH: 28.7 pg (ref 26.6–33.0)
MCHC: 32.2 g/dL (ref 31.5–35.7)
MCV: 89 fL (ref 79–97)
Monocytes Absolute: 0.4 10*3/uL (ref 0.1–0.9)
Monocytes: 6 %
Neutrophils Absolute: 3.4 10*3/uL (ref 1.4–7.0)
Neutrophils: 54 %
Platelets: 325 10*3/uL (ref 150–450)
RBC: 4.91 x10E6/uL (ref 3.77–5.28)
RDW: 12.4 % (ref 11.7–15.4)
WBC: 6.3 10*3/uL (ref 3.4–10.8)

## 2023-04-04 LAB — LIPID PANEL W/O CHOL/HDL RATIO
Cholesterol, Total: 173 mg/dL (ref 100–199)
HDL: 57 mg/dL (ref >39)
LDL Chol Calc (NIH): 95 mg/dL (ref 0–99)
Triglycerides: 118 mg/dL (ref 0–149)
VLDL Cholesterol Cal: 21 mg/dL (ref 5–40)

## 2023-04-04 NOTE — Assessment & Plan Note (Signed)
Has been exercising and working on diet without significant benefit. Given coronary artery disease, would benefit from wegovy. Rx sent today. Follow up 3 months after starting meds.

## 2023-04-04 NOTE — Telephone Encounter (Signed)
Prior Authorization was initiated via CoverMyMeds for prescription of Wegovy 0.25 MG. Awaiting determination.   KEY: [id number]

## 2023-04-04 NOTE — Telephone Encounter (Signed)
Please do PA

## 2023-04-04 NOTE — Assessment & Plan Note (Signed)
Unable to tolerate statins. Tolerating her repatha well. Rechecking labs today. Await results. Treat as needed.

## 2023-04-04 NOTE — Assessment & Plan Note (Signed)
Under good control on current regimen. Continue current regimen. Continue to monitor. Call with any concerns. Refills given for 3 months. Follow up 3 months.    

## 2023-04-04 NOTE — Assessment & Plan Note (Addendum)
Doing well not on medicine. Continue to monitor. Call with any concerns.

## 2023-04-04 NOTE — Assessment & Plan Note (Signed)
Under good control on current regimen. Continue current regimen. Continue to monitor. Call with any concerns. Refills given. Labs drawn today.   

## 2023-04-05 ENCOUNTER — Encounter: Payer: 59 | Admitting: *Deleted

## 2023-04-05 DIAGNOSIS — Z955 Presence of coronary angioplasty implant and graft: Secondary | ICD-10-CM

## 2023-04-05 NOTE — Progress Notes (Signed)
Daily Session Note  Patient Details  Name: Cassandra Robbins MRN: [id number] Date of Birth: Nov 27, 1970 Referring Provider:   Flowsheet Row Cardiac Rehab from 12/10/2022 in [facility] Cardiac and Pulmonary Rehab  Referring Provider Dr. [name], MD       Encounter Date: 04/05/2023  Check In:  Session Check In - 04/05/23 0948       Check-In   Supervising physician immediately available to respond to emergencies See telemetry face sheet for immediately available ER MD    Location [facility]-Cardiac & Pulmonary Rehab    Staff Present Cora Collum, RN, BSN, CCRP;Joseph Hood, RCP,RRT,BSRT;Noah Tickle, [place], Exercise Physiologist    Virtual Visit No    Medication changes reported     No    Fall or balance concerns reported    No    Warm-up and Cool-down Performed on first and last piece of equipment    Resistance Training Performed Yes    VAD Patient? No    PAD/SET Patient? No      Pain Assessment   Currently in Pain? No/denies                Social History   Tobacco Use  Smoking Status Never   Passive exposure: Never  Smokeless Tobacco Never    Goals Met:  Independence with exercise equipment Exercise tolerated well No report of concerns or symptoms today  Goals Unmet:  Not Applicable  Comments: Pt able to follow exercise prescription today without complaint.  Will continue to monitor for progression.    Dr. Bethann Punches is Medical Director for [facility] Cardiac Rehabilitation.  Dr. Vida Rigger is Medical Director for [facility] Pulmonary Rehabilitation.

## 2023-04-08 ENCOUNTER — Encounter: Payer: 59 | Admitting: *Deleted

## 2023-04-08 DIAGNOSIS — Z955 Presence of coronary angioplasty implant and graft: Secondary | ICD-10-CM | POA: Diagnosis not present

## 2023-04-08 NOTE — Progress Notes (Signed)
Cardiac Individual Treatment Plan  Patient Details  Name: Cassandra Robbins MRN: [id number] Date of Birth: [birth year] Referring Provider:   Flowsheet Row Cardiac Rehab from 12/10/2022 in [facility] Cardiac and Pulmonary Rehab  Referring Provider Dr. [name], MD       Initial Encounter Date:  Flowsheet Row Cardiac Rehab from 12/10/2022 in [facility] Cardiac and Pulmonary Rehab  Date 12/10/22       Visit Diagnosis: Status post coronary artery stent placement  Patient's Home Medications on Admission:  Current Outpatient Medications:    acetaminophen (TYLENOL) 500 MG tablet, Take 1,000 mg by mouth every 8 (eight) hours as needed for moderate pain., Disp: , Rfl:    [START ON 04/17/2023] amphetamine-dextroamphetamine (ADDERALL) 20 MG tablet, Take 1 tablet (20 mg total) by mouth 2 (two) times daily., Disp: 60 tablet, Rfl: 0   [START ON 05/17/2023] amphetamine-dextroamphetamine (ADDERALL) 20 MG tablet, Take 1 tablet (20 mg total) by mouth 2 (two) times daily., Disp: 60 tablet, Rfl: 0   [START ON 06/16/2023] amphetamine-dextroamphetamine (ADDERALL) 20 MG tablet, Take 1 tablet (20 mg total) by mouth 2 (two) times daily., Disp: 60 tablet, Rfl: 0   aspirin EC 81 MG tablet, Take 1 tablet (81 mg total) by mouth daily. Swallow whole., Disp: 90 tablet, Rfl: 3   cetirizine (ZYRTEC) 10 MG tablet, Take 10 mg by mouth daily., Disp: , Rfl:    clopidogrel (PLAVIX) 75 MG tablet, Take 1 tablet (75 mg total) by mouth daily with breakfast., Disp: 90 tablet, Rfl: 3   Evolocumab (REPATHA SURECLICK) 140 MG/ML SOAJ, Inject 140 mg into the skin every 14 (fourteen) days., Disp: 3 mL, Rfl: 7   fluticasone (FLONASE) 50 MCG/ACT nasal spray, Place 2 sprays into both nostrils daily., Disp: 16 g, Rfl: 6   ibuprofen (ADVIL) 200 MG tablet, Take 600 mg by mouth every 8 (eight) hours as needed for moderate pain., Disp: , Rfl:    ketoconazole (NIZORAL) 2 % cream, Apply 1 Application topically daily. (Patient not taking: Reported on  03/07/2023), Disp: 30 g, Rfl: 0   nitroGLYCERIN (NITROSTAT) 0.4 MG SL tablet, Place 1 tablet (0.4 mg total) under the tongue every 5 (five) minutes as needed for chest pain., Disp: 25 tablet, Rfl: 3   pantoprazole (PROTONIX) 40 MG tablet, Take 1 tablet (40 mg total) by mouth daily., Disp: 90 tablet, Rfl: 1   Semaglutide-Weight Management 0.25 MG/0.5ML SOAJ, Inject 0.25 mg into the skin once a week for 28 days., Disp: 2 mL, Rfl: 1   [START ON 05/02/2023] Semaglutide-Weight Management 0.5 MG/0.5ML SOAJ, Inject 0.5 mg into the skin once a week for 28 days., Disp: 2 mL, Rfl: 1   sodium chloride (OCEAN) 0.65 % SOLN nasal spray, Place 1 spray into both nostrils as needed for congestion., Disp: , Rfl:   Past Medical History: 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HCC)    Toe fracture, right 10/08/2017    Tobacco Use: Social History   Tobacco Use  Smoking Status Never   Passive exposure: Never  Smokeless Tobacco Never    Labs: Review Flowsheet  More data exists      Latest Ref Rng & Units 01/26/2021 01/08/2022 10/12/2022 12/17/2022 04/03/2023  Labs for ITP Cardiac and Pulmonary Rehab  Cholestrol 100 - 199  mg/dL 161  096  045  409  811   LDL (calc) 0 - 99 mg/dL 914  782  956  74  95   Direct LDL 0 - 99 mg/dL - - 213  86  -  HDL-C >08 mg/dL 54  57  59  49  57   Trlycerides 0 - 149 mg/dL 657  846  99  962  952   Hemoglobin A1c 4.8 - 5.6 % - 6.1  - - 6.2     Details             Exercise Target Goals: Exercise Program Goal: Individual exercise prescription set using results from initial 6 min walk test and THRR while considering  patient's activity barriers and safety.   Exercise Prescription Goal: Initial exercise prescription builds to 30-45 minutes a day of aerobic activity, 2-3 days per week.  Home  exercise guidelines will be given to patient during program as part of exercise prescription that the participant will acknowledge.   Education: Aerobic Exercise: - Group verbal and visual presentation on the components of exercise prescription. Introduces F.I.T.T principle from ACSM for exercise prescriptions.  Reviews F.I.T.T. principles of aerobic exercise including progression. Written material given at graduation. Flowsheet Row Cardiac Rehab from 12/10/2022 in [facility] Cardiac and Pulmonary Rehab  Education need identified 12/10/22       Education: Resistance Exercise: - Group verbal and visual presentation on the components of exercise prescription. Introduces F.I.T.T principle from ACSM for exercise prescriptions  Reviews F.I.T.T. principles of resistance exercise including progression. Written material given at graduation.    Education: Exercise & Equipment Safety: - Individual verbal instruction and demonstration of equipment use and safety with use of the equipment. Flowsheet Row Cardiac Rehab from 12/10/2022 in [facility] Cardiac and Pulmonary Rehab  Date 12/10/22  Educator NT  Instruction Review Code 1- Verbalizes Understanding       Education: Exercise Physiology & General Exercise Guidelines: - Group verbal and written instruction with models to review the exercise physiology of the cardiovascular system and associated critical values. Provides general exercise guidelines with specific guidelines to those with heart or lung disease.    Education: Flexibility, Balance, Mind/Body Relaxation: - Group verbal and visual presentation with interactive activity on the components of exercise prescription. Introduces F.I.T.T principle from ACSM for exercise prescriptions. Reviews F.I.T.T. principles of flexibility and balance exercise training including progression. Also discusses the mind body connection.  Reviews various relaxation techniques to help reduce and manage stress (i.e. Deep  breathing, progressive muscle relaxation, and visualization). Balance handout provided to take home. Written material given at graduation.   Activity Barriers & Risk Stratification:  Activity Barriers & Cardiac Risk Stratification - 12/10/22 1531       Activity Barriers & Cardiac Risk Stratification   Activity Barriers Arthritis    Cardiac Risk Stratification High             6 Minute Walk:  6 Minute Walk     Row Name 12/10/22 1529 03/20/23 0937       6 Minute Walk   Phase Initial Discharge    Distance 1600 feet 1830 feet    Distance % Change -- 14.4 %    Distance Feet Change -- 230 ft    Walk Time 6 minutes 6 minutes    # of Rest Breaks 0 0    MPH 3.03 3.5    METS 4.49 5    RPE 13 12    Perceived Dyspnea  1 0    VO2 Peak 15.71 17.6    Symptoms No No    Resting HR 88 bpm 67 bpm    Resting BP 134/74 124/70    Resting Oxygen Saturation  100 % 97 %    Exercise Oxygen Saturation  during 6 min walk 96 % 95 %    Max Ex. HR 123 bpm 124 bpm    Max Ex. BP 166/72 172/70    2 Minute Post BP 148/72 --             Oxygen Initial Assessment:   Oxygen Re-Evaluation:   Oxygen Discharge (Final Oxygen Re-Evaluation):   Initial Exercise Prescription:  Initial Exercise Prescription - 12/10/22 1500       Date of Initial Exercise RX and Referring Provider   Date 12/10/22    Referring Provider Dr. [name], MD      Oxygen   Maintain Oxygen Saturation 88% or higher      Treadmill   MPH 2.6    Grade 0.5    Minutes 15    METs 3.17      NuStep   Level 3   T6 Nustep   SPM 80    Minutes 15    METs 4.49      REL-XR   Level 4    Speed 50    Minutes 15    METs 4.49      Prescription Details   Frequency (times per week) 2    Duration Progress to 30 minutes of continuous aerobic without signs/symptoms of physical distress      Intensity   THRR 40-80% of Max Heartrate 119-151    Ratings of Perceived Exertion 11-13    Perceived Dyspnea 0-4       Progression   Progression Continue to progress workloads to maintain intensity without signs/symptoms of physical distress.      Resistance Training   Training Prescription Yes    Weight 5 lb    Reps 10-15             Perform Capillary Blood Glucose checks as needed.  Exercise Prescription Changes:   Exercise Prescription Changes     Row Name 12/10/22 1500 01/17/23 0800 01/31/23 1600 02/11/23 0900 02/14/23 0700     Response to Exercise   Blood Pressure (Admit) 134/74 118/68 124/70 -- 112/66   Blood Pressure (Exercise) 166/72 168/70 144/78 -- --   Blood Pressure (Exit) 148/72 124/62 104/62 -- 118/68   Heart Rate (Admit) 88 bpm 84 bpm 88 bpm -- 68 bpm   Heart Rate (Exercise) 128 bpm 153 bpm 156 bpm -- 143 bpm   Heart Rate (Exit) 95 bpm 116 bpm 102 bpm -- 107 bpm   Oxygen Saturation (Admit) 100 % -- -- -- --   Oxygen Saturation (Exercise) 96 % -- -- -- --   Rating of Perceived Exertion (Exercise) 13 14 13  -- 13   Perceived Dyspnea (Exercise) 1 -- 0 -- 0   Symptoms None none none -- none   Comments Results First two weeks of exercise First two weeks of exercise -- First two weeks of exercise   Duration -- Continue with 30 min of aerobic exercise without signs/symptoms of physical distress. Continue with 30 min of aerobic exercise without signs/symptoms of physical distress. -- Continue with 30 min of aerobic exercise without signs/symptoms of physical distress.   Intensity -- THRR unchanged THRR unchanged THRR unchanged THRR unchanged     Progression  Progression -- Continue to progress workloads to maintain intensity without signs/symptoms of physical distress. Continue to progress workloads to maintain intensity without signs/symptoms of physical distress. Continue to progress workloads to maintain intensity without signs/symptoms of physical distress. Continue to progress workloads to maintain intensity without signs/symptoms of physical distress.   Average METs -- 4.83  5.1 5.1 5.22     Resistance Training   Training Prescription -- Yes Yes Yes Yes   Weight -- 5 lb 6lb 6lb 6lb   Reps -- 10-15 10-15 10-15 10-15     Interval Training   Interval Training -- No No No No     Treadmill   MPH -- 3 4 4  3.4   Grade -- 3 3.2 3.2 4   Minutes -- 15 15 15 15    METs -- 4.54 5.83 5.83 5.48     NuStep   Level -- 4 -- -- --   Minutes -- 15 -- -- --   METs -- 5.6 -- -- --     Elliptical   Level -- 4 6 6 7    Speed -- 3 1 1  2.8   Minutes -- 15 15 15 15    METs -- -- 4.2 4.2 4.5     REL-XR   Level -- 5 8 8 7    Minutes -- 15 15 15 15    METs -- 6.1 6.7 6.7 7     Home Exercise Plan   Plans to continue exercise at -- -- -- Home (comment)  walking at home on TM --   Frequency -- -- -- Add 2 additional days to program exercise sessions. --   Initial Home Exercises Provided -- -- -- 02/11/23 --     Oxygen   Maintain Oxygen Saturation -- 88% or higher 88% or higher 88% or higher 88% or higher    Row Name 02/28/23 1500 03/12/23 1400 03/27/23 0900         Response to Exercise   Blood Pressure (Admit) 120/74 104/68 122/60     Blood Pressure (Exit) 122/60 110/62 112/56     Heart Rate (Admit) 79 bpm 77 bpm 87 bpm     Heart Rate (Exercise) 150 bpm 142 bpm 144 bpm     Heart Rate (Exit) 98 bpm 106 bpm 98 bpm     Rating of Perceived Exertion (Exercise) 13 13 14      Perceived Dyspnea (Exercise) 0 0 --     Symptoms none none none     Duration Continue with 30 min of aerobic exercise without signs/symptoms of physical distress. Continue with 30 min of aerobic exercise without signs/symptoms of physical distress. Continue with 30 min of aerobic exercise without signs/symptoms of physical distress.     Intensity THRR unchanged THRR unchanged THRR unchanged       Progression   Progression Continue to progress workloads to maintain intensity without signs/symptoms of physical distress. Continue to progress workloads to maintain intensity without signs/symptoms of  physical distress. Continue to progress workloads to maintain intensity without signs/symptoms of physical distress.     Average METs 6 4.9 6.24       Resistance Training   Training Prescription Yes Yes Yes     Weight 6lb 6lb 6 lb     Reps 10-15 10-15 10-15       Interval Training   Interval Training No No No       Treadmill   MPH 3.5 4.5 3.4     Grade 4.5 2.8 5.5  Minutes 15 15 15      METs 5.85 6.18 6.18       Elliptical   Level 7 8 9      Speed 3 3 3      Minutes 15 15 15      METs 5 5.4 5.8       REL-XR   Level 8 8 10      Minutes 15 15 15      METs 7.4 6.7 8.5       Home Exercise Plan   Plans to continue exercise at Home (comment)  walking at home on TM Home (comment)  walking at home on TM Home (comment)  walking at home on TM     Frequency Add 2 additional days to program exercise sessions. Add 2 additional days to program exercise sessions. Add 2 additional days to program exercise sessions.     Initial Home Exercises Provided 02/11/23 02/11/23 02/11/23       Oxygen   Maintain Oxygen Saturation 88% or higher 88% or higher 88% or higher              Exercise Comments:   Exercise Comments     Row Name 01/02/23 1020           Exercise Comments First full day of exercise!  Patient was oriented to gym and equipment including functions, settings, policies, and procedures.  Patient's individual exercise prescription and treatment plan were reviewed.  All starting workloads were established based on the results of the 6 minute walk test done at initial orientation visit.  The plan for exercise progression was also introduced and progression will be customized based on patient's performance and goals.                Exercise Goals and Review:   Exercise Goals     Row Name 12/10/22 1532             Exercise Goals   Increase Physical Activity Yes       Intervention Provide advice, education, support and counseling about physical activity/exercise  needs.;Develop an individualized exercise prescription for aerobic and resistive training based on initial evaluation findings, risk stratification, comorbidities and participant's personal goals.       Expected Outcomes Short Term: Attend rehab on a regular basis to increase amount of physical activity.;Long Term: Add in home exercise to make exercise part of routine and to increase amount of physical activity.;Long Term: Exercising regularly at least 3-5 days a week.       Increase Strength and Stamina Yes       Intervention Provide advice, education, support and counseling about physical activity/exercise needs.;Develop an individualized exercise prescription for aerobic and resistive training based on initial evaluation findings, risk stratification, comorbidities and participant's personal goals.       Expected Outcomes Short Term: Increase workloads from initial exercise prescription for resistance, speed, and METs.;Short Term: Perform resistance training exercises routinely during rehab and add in resistance training at home;Long Term: Improve cardiorespiratory fitness, muscular endurance and strength as measured by increased METs and functional capacity ( )       Able to understand and use rate of perceived exertion (RPE) scale Yes       Intervention Provide education and explanation on how to use RPE scale       Expected Outcomes Short Term: Able to use RPE daily in rehab to express subjective intensity level;Long Term:  Able to use RPE to guide intensity level when exercising independently  Able to understand and use Dyspnea scale Yes       Intervention Provide education and explanation on how to use Dyspnea scale       Expected Outcomes Short Term: Able to use Dyspnea scale daily in rehab to express subjective sense of shortness of breath during exertion;Long Term: Able to use Dyspnea scale to guide intensity level when exercising independently       Knowledge and understanding of  Target Heart Rate Range (THRR) Yes       Intervention Provide education and explanation of THRR including how the numbers were predicted and where they are located for reference       Expected Outcomes Short Term: Able to state/look up THRR;Long Term: Able to use THRR to govern intensity when exercising independently;Short Term: Able to use daily as guideline for intensity in rehab       Able to check pulse independently Yes       Intervention Provide education and demonstration on how to check pulse in carotid and radial arteries.;Review the importance of being able to check your own pulse for safety during independent exercise       Expected Outcomes Short Term: Able to explain why pulse checking is important during independent exercise;Long Term: Able to check pulse independently and accurately       Understanding of Exercise Prescription Yes       Intervention Provide education, explanation, and written materials on patient's individual exercise prescription       Expected Outcomes Short Term: Able to explain program exercise prescription;Long Term: Able to explain home exercise prescription to exercise independently                Exercise Goals Re-Evaluation :  Exercise Goals Re-Evaluation     Row Name 01/02/23 1021 01/17/23 0844 01/23/23 0924 01/31/23 1641 02/11/23 0944     Exercise Goal Re-Evaluation   Exercise Goals Review Increase Physical Activity;Able to understand and use rate of perceived exertion (RPE) scale;Knowledge and understanding of Target Heart Rate Range (THRR);Understanding of Exercise Prescription;Increase Strength and Stamina;Able to check pulse independently Increase Physical Activity;Increase Strength and Stamina;Understanding of Exercise Prescription Increase Physical Activity;Increase Strength and Stamina;Understanding of Exercise Prescription Increase Physical Activity;Increase Strength and Stamina;Understanding of Exercise Prescription Increase Strength and  Stamina;Increase Physical Activity;Able to understand and use rate of perceived exertion (RPE) scale;Able to understand and use Dyspnea scale;Knowledge and understanding of Target Heart Rate Range (THRR);Able to check pulse independently;Understanding of Exercise Prescription   Comments Reviewed RPE  and dyspnea scale, THR and program prescription with pt today.  Pt voiced understanding and was given a copy of goals to take home. Cassandra Robbins is off to a good start in the program. She was able to increase her treadmill workload to a speed of 3 mph with a 3% incline during her first two weeks of rehab. She also improved to level 4 on the elliptical and level 5 on the XR. We will continue to monitor her progress in the program. Cassandra Robbins states that she feels she is doing well with exercise. She reports progressively increasing her workloads on the treadmill and XR. She also states that she feels better after exercise, and has not felt as fatigued. She also states that she has been walking 20 minutes a day on a treadmill at home on the days when she is not coming to rehab as well. We will continue to monitor her progress. Cassandra Robbins continues to do well and show progress in rehab. She has  recently increased her hand weights from 5 to 6 lb. On her exercise machines, she has increased her treadmill speed from to , and on the XR she increased her level from 5 to 8. We will continue to monitor her progress in the program. Reviewed home exercise with pt today.  Pt plans to walk at home on home TM for exercise.  Reviewed THR, pulse, RPE, sign and symptoms, pulse oximetery and when to call 911 or MD.  Also discussed weather considerations and indoor options.  Pt voiced understanding.   Expected Outcomes Short: Use RPE daily to regulate intensity.  Long: Follow program prescription in THR. Short: Continue to progressively increase exercise workloads. Long: Continue exercise to improve strength and stamina. Short: Continue to  walk on days away from rehab. Long: Continue to exercise independently. Short: Continue to follow current exercise prescription, and progressively increase workloads. Long: Continue exercise to improve strength and stamina. Short: add 1-2 days of exercise at home on off days of rehab. Long: Become independent with exercise routine.    Row Name 02/14/23 2706 02/28/23 1524 03/12/23 1451 03/27/23 0955       Exercise Goal Re-Evaluation   Exercise Goals Review Increase Physical Activity;Increase Strength and Stamina;Understanding of Exercise Prescription Increase Physical Activity;Increase Strength and Stamina;Understanding of Exercise Prescription Increase Physical Activity;Increase Strength and Stamina;Understanding of Exercise Prescription Increase Physical Activity;Increase Strength and Stamina;Understanding of Exercise Prescription    Comments Cassandra Robbins continues to do well in rehab. She has recently been able to increase her level on the elliptical from level 6 to 7, she also was able to increase her speed to 2.44mph. She continues to maintain her workload on the treadmill and the XR. We will continue to monitor her progress in the program. Cassandra Robbins continues to do well in rehab. She has recently been able to increase her workload on the treadmill to a speed of 3.5 mph and a grade of 4.5%. She has been able to maintain her intensity on the elliptical and REXR. We will continue to monitor her progress in the program. Cassandra Robbins continues to make improvements in rehab. She was recently able to increase her level on the elliptical from level 7 to 8. She was also able to increase her speed on the treadmill from 3. to 4.41mph. We will continue to monitor her progress in the program. Cassandra Robbins continues to do well in rehab and is close to graduating. She recently completed her post and improved by 14.4%! She also improved to level 10 on the XR and level 9 on the elliptical. She also increased her workload on the  treadmill to a speed of 3.4 mph and an incline of 5.5%. We will continue to monitor her progress until she gradutes from the program.    Expected Outcomes Short: Increase XR level to 8. Long: Continue to exercise to improve strength and stamina. Short: Continue to progressively increase treadmill workload. Long: Continue exercise to improve strength and stamina. Short: Continue to progress treadmill, and elliptical workloads. Long: Continue to exercise to improve strength and stamina. Short: Graduate. Long: Continue to exercise independently.             Discharge Exercise Prescription (Final Exercise Prescription Changes):  Exercise Prescription Changes - 03/27/23 0900       Response to Exercise   Blood Pressure (Admit) 122/60    Blood Pressure (Exit) 112/56    Heart Rate (Admit) 87 bpm    Heart Rate (Exercise) 144 bpm    Heart  Rate (Exit) 98 bpm    Rating of Perceived Exertion (Exercise) 14    Symptoms none    Duration Continue with 30 min of aerobic exercise without signs/symptoms of physical distress.    Intensity THRR unchanged      Progression   Progression Continue to progress workloads to maintain intensity without signs/symptoms of physical distress.    Average METs 6.24      Resistance Training   Training Prescription Yes    Weight 6 lb    Reps 10-15      Interval Training   Interval Training No      Treadmill   MPH 3.4    Grade 5.5    Minutes 15    METs 6.18      Elliptical   Level 9    Speed 3    Minutes 15    METs 5.8      REL-XR   Level 10    Minutes 15    METs 8.5      Home Exercise Plan   Plans to continue exercise at Home (comment)   walking at home on TM   Frequency Add 2 additional days to program exercise sessions.    Initial Home Exercises Provided 02/11/23      Oxygen   Maintain Oxygen Saturation 88% or higher             Nutrition:  Target Goals: Understanding of nutrition guidelines, daily intake of sodium 1500mg ,  cholesterol 200mg , calories 30% from fat and 7% or less from saturated fats, daily to have 5 or more servings of fruits and vegetables.  Education: All About Nutrition: -Group instruction provided by verbal, written material, interactive activities, discussions, models, and posters to present general guidelines for heart healthy nutrition including fat, fiber, MyPlate, the role of sodium in heart healthy nutrition, utilization of the nutrition label, and utilization of this knowledge for meal planning. Follow up email sent as well. Written material given at graduation.   Biometrics:  Pre Biometrics - 12/10/22 1533       Pre Biometrics   Height 5\' 11"  (1.803 m)    Weight 248 lb 9.6 oz (112.8 kg)    Waist Circumference 38.5 inches    Hip Circumference 49 inches    Waist to Hip Ratio 0.79 %    BMI (Calculated) 34.69    Single Leg Stand 30 seconds             Post Biometrics - 03/20/23 0942        Post  Biometrics   Height 5\' 11"  (1.803 m)    Weight 241 lb 12.8 oz (109.7 kg)    Waist

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
## 2023-04-08 NOTE — Progress Notes (Signed)
Discharge Summary:  Cassandra Robbins  (DOB: Nov 15, 1970)   [place] graduated today from  rehab with 36 sessions completed.  Details of the patient's exercise prescription and what She needs to do in order to continue the prescription and progress were discussed with patient.  Patient was given a copy of prescription and goals.  Patient verbalized understanding. Navi plans to continue to exercise by walking at home on the treadmill.   6 Minute Walk     Row Name 12/10/22 1529 03/20/23 0937       6 Minute Walk   Phase Initial Discharge    Distance 1600 feet 1830 feet    Distance % Change -- 14.4 %    Distance Feet Change -- 230 ft    Walk Time 6 minutes 6 minutes    # of Rest Breaks 0 0    MPH 3.03 3.5    METS 4.49 5    RPE 13 12    Perceived Dyspnea  1 0    VO2 Peak 15.71 17.6    Symptoms No No    Resting HR 88 bpm 67 bpm    Resting BP 134/74 124/70    Resting Oxygen Saturation  100 % 97 %    Exercise Oxygen Saturation  during 6 min walk 96 % 95 %    Max Ex. HR 123 bpm 124 bpm    Max Ex. BP 166/72 172/70    2 Minute Post BP 148/72 --

## 2023-04-10 ENCOUNTER — Encounter: Payer: 59 | Admitting: *Deleted

## 2023-05-01 DIAGNOSIS — Z419 Encounter for procedure for purposes other than remedying health state, unspecified: Secondary | ICD-10-CM | POA: Diagnosis not present

## 2023-05-07 ENCOUNTER — Other Ambulatory Visit: Payer: Self-pay | Admitting: *Deleted

## 2023-05-07 DIAGNOSIS — E785 Hyperlipidemia, unspecified: Secondary | ICD-10-CM

## 2023-05-08 LAB — LIPID PANEL
Chol/HDL Ratio: 2.9 {ratio} (ref 0.0–4.4)
Cholesterol, Total: 180 mg/dL (ref 100–199)
HDL: 63 mg/dL (ref >39)
LDL Chol Calc (NIH): 99 mg/dL (ref 0–99)
Triglycerides: 101 mg/dL (ref 0–149)
VLDL Cholesterol Cal: 18 mg/dL (ref 5–40)

## 2023-05-10 ENCOUNTER — Encounter: Payer: Self-pay | Admitting: Physician Assistant

## 2023-05-10 ENCOUNTER — Ambulatory Visit: Payer: 59 | Attending: Physician Assistant | Admitting: Physician Assistant

## 2023-05-10 VITALS — BP 124/83 | HR 85 | Ht 70.0 in | Wt 246.0 lb

## 2023-05-10 DIAGNOSIS — Z789 Other specified health status: Secondary | ICD-10-CM | POA: Diagnosis not present

## 2023-05-10 DIAGNOSIS — E785 Hyperlipidemia, unspecified: Secondary | ICD-10-CM | POA: Diagnosis not present

## 2023-05-10 DIAGNOSIS — I2510 Atherosclerotic heart disease of native coronary artery without angina pectoris: Secondary | ICD-10-CM

## 2023-05-10 NOTE — Patient Instructions (Signed)
 Medication Instructions:  Your Physician recommend you continue on your current medication as directed.    *If you need a refill on your cardiac medications before your next appointment, please call your pharmacy*   Lab Work: None ordered at this time    Follow-Up: At [facility], you and your health needs are our priority.  As part of our continuing mission to provide you with exceptional heart care, we have created designated Provider Care Teams.  These Care Teams include your primary Cardiologist (physician) and Advanced Practice Providers (APPs -  Physician Assistants and Nurse Practitioners) who all work together to provide you with the care you need, when you need it.  Your next appointment:   6 month(s)  Provider:   You may see Alm Clay, MD or one of the following Advanced Practice Providers on your designated Care Team:   Lonni Meager, NP Bernardino Bring, PA-C Cadence Franchester, PA-C Tylene Lunch, NP Barnie Hila, NP

## 2023-05-10 NOTE — Progress Notes (Signed)
 Cardiology Office Note    Date:  05/10/2023   ID:  Cassandra Robbins, DOB 19-May-1970, MRN [id number]  PCP:  Vicci Duwaine SQUIBB, DO  Cardiologist:  Alm Clay, MD  Electrophysiologist:  None   Chief Complaint: Follow up  History of Present Illness:   Cassandra Robbins is a 53 y.o. female with history of CAD status post PCI/DES to the RCA and LPAV in 09/2022, HLD, and stills disease who presents for follow-up of his CAD.   She was evaluated by Dr. Clay in 08/2022 for shortness of breath and stridor following URI in early 2024.  Subsequent coronary CTA in 09/2022 showed a calcium  score of 0.  There was noncalcified plaque in the RCA estimated at 50 to 69% with a ctFFR of 0.72.  Echo in 09/2022 showed an EF of 55 to 60%, no regional wall motion normalities, normal LV diastolic function parameters, normal RV systolic function and ventricular cavity size, mild mitral regurgitation, and an estimated right atrial pressure of 3 mmHg.  LHC on 10/19/2022 showed severe two-vessel CAD involving the mid RCA with 80% stenosis in proximal LPAV with 80% stenosis with successful PCI/DES to the proximal to mid RCA and LPAV.  There was nonobstructive disease involving the proximal LAD of 40% at P1/D1 takeoff, as well as 20 to 30% mid RCA stenosis just distal to the significant lesion.  Normal LVEDP.  Procedure was complicated by significant coronary spasm involving the LAD resolved with intracoronary nitroglycerin  and additional sedation, with postprocedure complicated by headache and visual changes with CTA head nonacute.  Neurology felt the patient had nitroglycerin  or spasm induced migraine.  She was seen in the office in 01/2023 and did note an occasional sensation in her throat similar to what she experienced eating after PCI, though not as severe or exactly similar.  There was question if she was having some vasospasm leading to the initiation of amlodipine  2.5 mg.  She was taking rosuvastatin  twice weekly and  continued to note myalgias and nausea.  It was noted she was also previously intolerant to atorvastatin .  She was transitioned from rosuvastatin  to Repatha .  She was last seen in the office in 03/2023 noting an increase in fatigue following the initiation of amlodipine  and no change in the throat discomfort, that she subsequently attributed to allergies.  No symptoms suggestive of prior angina at that time.  Amlodipine  was subsequently discontinued.  She comes in doing well from a cardiac perspective and is without symptoms of angina or cardiac decompensation.  No further sensations of throat discomfort.  No dyspnea, dizziness, palpitations, presyncope, or syncope.  No falls or symptoms concerning for bleeding.  Adherent and tolerating Repatha .  Follows a heart healthy diet.  Was just started on Wegovy  and would like to see how her weight and numbers trend on this prior to initiating further lipid-lowering therapy.  Otherwise, she is doing well and does not have any acute cardiac concerns at this time.   Labs independently reviewed: 05/2023 - TC 180, TG 101, HDL 63, LDL 99 03/2023 - TSH normal, BUN 13, serum creatinine 0.75, potassium 4.9, albumin 4.5, AST/ALT normal, Hgb 14.1, PLT 325, A1c 6.2  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HCC)    Toe fracture, right  10/08/2017    Past Surgical History:  Procedure Laterality Date   COLONOSCOPY WITH PROPOFOL  N/A 02/21/2022   Procedure: COLONOSCOPY WITH PROPOFOL ;  Surgeon: Unk Corinn Skiff, MD;  Location: [facility] ENDOSCOPY;  Service: Gastroenterology;  Laterality: N/A;   CORONARY STENT INTERVENTION N/A 10/19/2022   Procedure: CORONARY STENT INTERVENTION;  Surgeon: Anner Alm ORN, MD;  Location: [facility] INVASIVE CV LAB;  Service: Cardiovascular;  Laterality: N/A;    LAPAROSCOPIC ENDOMETRIOSIS FULGURATION  2007   LEFT HEART CATH AND CORONARY ANGIOGRAPHY N/A 10/19/2022   Procedure: LEFT HEART CATH AND CORONARY ANGIOGRAPHY;  Surgeon: Anner Alm ORN, MD;  Location: [facility] INVASIVE CV LAB;  Service: Cardiovascular;  Laterality: N/A;   SEPTOPLASTY  2005   TUBAL LIGATION      Current Medications: Current Meds  Medication Sig   acetaminophen  (TYLENOL ) 500 MG tablet Take 1,000 mg by mouth every 8 (eight) hours as needed for moderate pain.   amphetamine -dextroamphetamine  (ADDERALL) 20 MG tablet Take 1 tablet (20 mg total) by mouth 2 (two) times daily.   [START ON 05/17/2023] amphetamine -dextroamphetamine  (ADDERALL) 20 MG tablet Take 1 tablet (20 mg total) by mouth 2 (two) times daily.   [START ON 06/16/2023] amphetamine -dextroamphetamine  (ADDERALL) 20 MG tablet Take 1 tablet (20 mg total) by mouth 2 (two) times daily.   aspirin  EC 81 MG tablet Take 1 tablet (81 mg total) by mouth daily. Swallow whole.   cetirizine (ZYRTEC) 10 MG tablet Take 10 mg by mouth daily.   clopidogrel  (PLAVIX ) 75 MG tablet Take 1 tablet (75 mg total) by mouth daily with breakfast.   Evolocumab  (REPATHA  SURECLICK) 140 MG/ML SOAJ Inject 140 mg into the skin every 14 (fourteen) days.   fluticasone  (FLONASE ) 50 MCG/ACT nasal spray Place 2 sprays into both nostrils daily.   ibuprofen (ADVIL) 200 MG tablet Take 600 mg by mouth every 8 (eight) hours as needed for moderate pain.   ketoconazole  (NIZORAL ) 2 % cream Apply 1 Application topically daily.   nitroGLYCERIN  (NITROSTAT ) 0.4 MG SL tablet Place 1 tablet (0.4 mg total) under the tongue every 5 (five) minutes as needed for chest pain.   pantoprazole  (PROTONIX ) 40 MG tablet Take 1 tablet (40 mg total) by mouth daily.   Semaglutide -Weight Management 0.5 MG/0.5ML SOAJ Inject 0.5 mg into the skin once a week for 28 days. (Patient taking differently: Inject 0.5 mg into the skin once a week. .25 mg for 2 weeks)   sodium chloride  (OCEAN) 0.65 % SOLN nasal  spray Place 1 spray into both nostrils as needed for congestion.    Allergies:   Penicillins, Amoxicillin, and Morphine    Social History   Socioeconomic History   Marital status: Divorced    Spouse name: Not on file   Number of children: Not on file   Years of education: Not on file   Highest education level: Bachelor's degree (e.g., BA, AB, BS)  Occupational History   Occupation: paramedic  Tobacco Use   Smoking status: Never    Passive exposure: Never   Smokeless tobacco: Never  Vaping Use   Vaping status: Never Used  Substance and Sexual Activity   Alcohol use: Yes    Alcohol/week: 2.0 - 4.0 standard drinks of alcohol    Types: 1 - 2 Glasses of wine, 1 - 2 Shots of liquor per week    Comment: on occasion   Drug use: No   Sexual activity: Yes    Birth control/protection: Other-see comments    Comment: Tibal ligation  Other Topics Concern  Not on file  Social History Narrative   She has been a paramedic for 32 years.     Social Drivers of Corporate Investment Banker Strain: Low Risk  (04/01/2023)   Overall Financial Resource Strain (CARDIA)    Difficulty of Paying Living Expenses: Not hard at all  Food Insecurity: No Food Insecurity (04/01/2023)   Hunger Vital Sign    Worried About Running Out of Food in the Last Year: Never true    Ran Out of Food in the Last Year: Never true  Transportation Needs: No Transportation Needs (04/01/2023)   PRAPARE - Administrator, Civil Service (Medical): No    Lack of Transportation (Non-Medical): No  Physical Activity: Sufficiently Active (04/01/2023)   Exercise Vital Sign    Days of Exercise per Week: 4 days    Minutes of Exercise per Session: 60 min  Stress: No Stress Concern Present (04/01/2023)   [employer] of Occupational Health - Occupational Stress Questionnaire    Feeling of Stress : Only a little  Social Connections: Socially Integrated (04/01/2023)   Social Connection and Isolation Panel [NHANES]     Frequency of Communication with Friends and Family: More than three times a week    Frequency of Social Gatherings with Friends and Family: More than three times a week    Attends Religious Services: More than 4 times per year    Active Member of [employer] or Organizations: Yes    Attends Engineer, Structural: More than 4 times per year    Marital Status: Living with partner     Family History:  The patient's family history includes Asthma in her mother; Cancer (age of onset: 27) in her father; Lung disease in her mother; Lymphoma in her sister; Pulmonary fibrosis in her maternal grandfather; Rheum arthritis in her sister; Thyroid  disease in her mother. There is no history of Breast cancer.  ROS:   12-point review of systems is negative unless otherwise noted in the HPI.   EKGs/Labs/Other Studies Reviewed:    Studies reviewed were summarized above. The additional studies were reviewed today:  LHC 10/19/2022:   Prox LAD to Mid LAD lesion is 40% stenosed. => Notable spasm after PCI, relieved with nitroglycerin .   ---------------------------------------------------   Prox RCA to Mid RCA lesion is 80% stenosed.   A drug-eluting stent was successfully placed using a SYNERGY XD 3.50X16 => postdilated to 3.8 mm. Post intervention, there is a 0% residual stenosis.TIMI-3 flow pre and post   ---------------------------------------------------   LPAV lesion is 80% stenosed with 80% stenosed side branch in 2nd LPL.   Score score flex angioplasty performed followed by successful placement of a A drug-eluting stent using a SYNERGY XD 3.0X20.  Postdilated to 3.6 mm; Post intervention, there is a 0% residual stenosis -> going from LPA V into LPL 2; TIMI-3 flow pre and post   Mid RCA lesion is 30% stenosed.   ----------------------------------------------------   LV end diastolic pressure is normal.   There is no aortic valve stenosis.   ---------------------------------------------------    Recommend uninterrupted dual antiplatelet therapy with Aspirin  81mg  daily and Clopidogrel  75mg  daily for a minimum of 6 months (stable ischemic heart disease-Class I recommendation).   Will reduce to maintenance Plavix  monotherapy 75 mg daily to complete additional 18 months.  Can be held for procedures after 6 months.   POST-CATH DIAGNOSES Severe two-vessel CAD involving mid RCA 80% stenosis and proximal (LPAV)-LPL-2 80% stenosis both were by FFR ct\ Successful  DES PCI of proximal to mid RCA a 80% stenosis reduced to 0% with Synergy DES 3.5 mm x 16 mm (postdilated to 3.8 mm). TIMI-3 flow pre and post. Successful DES PCI of LPAV/LPL 2 reducing 80% to 0% with Synergy DES 3.0 mm by 20 mm (postdilated to 3.6 mm.). TIMI-3 flow pre and post Proximal LAD 40% stenosis at SP1/D1 takeoff, also 20 to 30% mid RCA just distal to the significant lesion. Normal LVEDP Significant coronary spasm after post dilation of LCx lesion-spasm of the proximal LAD for percent stenosis resolved with IC and additional sedation.  She did have significant discomfort with this both the throat discomfort which was her angina as well as true substernal chest pressure. This resolved after nitroglycerin  and additional sedation; it also coincided with brachial/radial arterial spasm on the guide catheter. Postprocedure patient had mild headache with visual after maladies. Code stroke called, neurology suspected nitroglycerin  or spasm induced migraine headache but agreed with CT of head.      RECOMMENDATIONS Based on the 10 spasm, plan is to treat with IV NTG, however may change to verapamil  per neurology. CT scan of the head to evaluate visual symptoms. Will monitor overnight outpatient with extended recovery. __________   2D echo 10/17/2022: 1. Left ventricular ejection fraction, by estimation, is 55 to 60%. Left  ventricular ejection fraction by 2D MOD biplane is 56.9 %. The left  ventricle has normal function. The left  ventricle has no regional wall  motion abnormalities. Left ventricular  diastolic parameters were normal. The average left ventricular global  longitudinal strain is -22.2 %. The global longitudinal strain is normal.   2. Right ventricular systolic function is normal. The right ventricular  size is normal.   3. The mitral valve is normal in structure. Mild mitral valve  regurgitation.   4. The aortic valve was not well visualized. Aortic valve regurgitation  is not visualized.   5. The inferior vena cava is normal in size with greater than 50%  respiratory variability, suggesting right atrial pressure of 3 mmHg.  ___________   Coronary CTA 10/08/2022: FINDINGS: Aorta:  Normal size.  No calcifications.  No dissection.   Aortic Valve:  Trileaflet.  No calcifications.   Coronary Arteries:  Normal coronary origin.  Right dominance.   RCA is a dominant artery. There is non calcified plaque proximally causing moderate stenosis (50-69%).   Left main gives rise to LAD and LCX arteries. LM has no disease.   LAD has no plaque.   LCX is a non-dominant artery.  There is no plaque.   Other findings:   Normal pulmonary vein drainage into the left atrium.   Normal left atrial appendage without a thrombus.   Normal size of the pulmonary artery.   IMPRESSION: 1. Coronary calcium  score of 0. 2. Normal coronary origin with right dominance. 3. Non calcified plaque in the proximal RCA causing moderate stenosis (50-69%). 4. CAD-RADS 3. Moderate stenosis. Consider symptom-guided anti-ischemic pharmacotherapy as well as risk factor modification per guideline directed care. 5. Additional analysis with CT FFR will be submitted and reported separately.   ctFFR: 1. Left Main:  No significant stenosis. 2. LAD: No significant stenosis.  FFRct 0.92 3. LCX: No significant stenosis.  FFRct 0.98 4. RCA: significant stenosis in the proximal RCA.  FFRct 0.72   IMPRESSION: 1. CT FFR analysis  showed significant stenosis in the proximal RCA. FFRct 0.72 2.  Recommend cardiac catheterization.   EKG:  EKG is ordered today.  The EKG  ordered today demonstrates NSR, 85 bpm, no acute st/t changes  Recent Labs: 10/02/2022: BNP 3.8 04/03/2023: ALT 23; BUN 13; Creatinine, Ser 0.75; Hemoglobin 14.1; Platelets 325; Potassium 4.9; Sodium 141; TSH 1.360  Recent Lipid Panel    Component Value Date/Time   CHOL 180 05/07/2023 1045   TRIG 101 05/07/2023 1045   HDL 63 05/07/2023 1045   CHOLHDL 2.9 05/07/2023 1045   CHOLHDL 3.0 12/17/2022 1153   VLDL 22 12/17/2022 1153   LDLCALC 99 05/07/2023 1045   LDLDIRECT 86 12/17/2022 1153    PHYSICAL EXAM:    VS:  BP 124/83 (BP Location: Left Arm, Patient Position: Sitting, Cuff Size: Normal)   Pulse 85   Ht 5' 10 (1.778 m)   Wt 246 lb (111.6 kg)   SpO2 98%   BMI 35.30 kg/m   BMI: Body mass index is 35.3 kg/m.  Physical Exam Vitals reviewed.  Constitutional:      Appearance: She is well-developed.  HENT:     Head: Normocephalic and atraumatic.  Eyes:     General:        Right eye: No discharge.        Left eye: No discharge.  Neck:     Vascular: No JVD.  Cardiovascular:     Rate and Rhythm: Normal rate and regular rhythm.     Heart sounds: Normal heart sounds, S1 normal and S2 normal. Heart sounds not distant. No midsystolic click and no opening snap. No murmur heard.    No friction rub.  Pulmonary:     Effort: Pulmonary effort is normal. No respiratory distress.     Breath sounds: Normal breath sounds. No decreased breath sounds, wheezing, rhonchi or rales.  Chest:     Chest wall: No tenderness.  Abdominal:     General: There is no distension.  Musculoskeletal:     Cervical back: Normal range of motion.  Skin:    General: Skin is warm and dry.     Nails: There is no clubbing.  Neurological:     Mental Status: She is alert and oriented to person, place, and time.  Psychiatric:        Speech: Speech normal.        Behavior:  Behavior normal.        Thought Content: Thought content normal.        Judgment: Judgment normal.     Wt Readings from Last 3 Encounters:  05/10/23 246 lb (111.6 kg)  04/03/23 245 lb 6.4 oz (111.3 kg)  03/20/23 241 lb 12.8 oz (109.7 kg)     ASSESSMENT & PLAN:   CAD involving the native coronary arteries without angina: She is doing well and without symptoms concerning for angina or cardiac decompensation.  She remains on DAPT with aspirin  81 mg and clopidogrel  75 mg along with Repatha  as outlined below.  No indication for further ischemic testing at this time.  HLD with statin intolerance: LDL 99 in 05/2023 with target LDL less than 70.  This is improved from an LDL of 160 in 09/2022.  Intolerant to atorvastatin  and rosuvastatin , even at twice weekly dosing.  Remains on Repatha  and tolerating.  Prefers to see how weight and numbers trend following recently added Wegovy .  We will plan to obtain fasting lipid panel in 6 months.  May need to consider addition of bempedoic acid.     Disposition: F/u with Dr. Anner or an APP in 6 months.   Medication Adjustments/Labs and Tests Ordered: Current  medicines are reviewed at length with the patient today.  Concerns regarding medicines are outlined above. Medication changes, Labs and Tests ordered today are summarized above and listed in the Patient Instructions accessible in Encounters.   Signed, Bernardino Bring, PA-C 05/10/2023 12:24 PM     [facility] [place] [address] [place], [place] [address] [phone]

## 2023-05-16 ENCOUNTER — Other Ambulatory Visit: Payer: Self-pay | Admitting: Family Medicine

## 2023-05-16 NOTE — Telephone Encounter (Signed)
Please refuse, RX is at the pharmacy to be filled tomorrow.

## 2023-05-16 NOTE — Telephone Encounter (Signed)
Requested medication (s) are due for refill today: yes  Requested medication (s) are on the active medication list: yes  Last refill:  04/03/23  Future visit scheduled: yes  Notes to clinic:  Unable to refill per protocol, cannot delegate.      Requested Prescriptions  Pending Prescriptions Disp Refills   amphetamine-dextroamphetamine (ADDERALL) 20 MG tablet [Pharmacy Med Name: DEXTROAMP-AMPHETAMIN 20 MG TAB] 60 tablet 0    Sig: Take 1 tablet (20 mg total) by mouth 2 (two) timesdaily.     Not Delegated - Psychiatry:  Stimulants/ADHD Failed - 05/16/2023  1:54 PM      Failed - This refill cannot be delegated      Failed - Urine Drug Screen completed in last 360 days      Passed - Last BP in normal range    BP Readings from Last 1 Encounters:  05/10/23 124/83         Passed - Last Heart Rate in normal range    Pulse Readings from Last 1 Encounters:  05/10/23 85         Passed - Valid encounter within last 6 months    Recent Outpatient Visits           1 month ago Routine general medical examination at a health care facility   [facility] [place], Megan P, DO   3 months ago Adult ADHD   [facility] [place], [place] P, DO   7 months ago Peripheral edema   [facility] [place], Megan P, DO   9 months ago SOB (shortness of breath)   [facility], Megan P, DO   10 months ago SOB (shortness of breath)   [facility] [name], DO       Future Appointments             In 6 days [name], DO [facility]

## 2023-05-22 ENCOUNTER — Encounter: Payer: Self-pay | Admitting: Family Medicine

## 2023-05-22 ENCOUNTER — Ambulatory Visit (INDEPENDENT_AMBULATORY_CARE_PROVIDER_SITE_OTHER): Payer: 59 | Admitting: Family Medicine

## 2023-05-22 DIAGNOSIS — Z6835 Body mass index (BMI) 35.0-35.9, adult: Secondary | ICD-10-CM

## 2023-05-22 NOTE — Assessment & Plan Note (Signed)
Due to CAD, HLD and depression. Tolerating medicine well. 2 weeks into the medicine and down at least 1 pound. Will continue titration up and recheck in about 6 weeks. Call with any concerns.

## 2023-05-22 NOTE — Progress Notes (Signed)
BP 113/72   Pulse 74   Temp 98.7 F (37.1 C) (Oral)   Wt 244 lb 6.4 oz (110.9 kg)   SpO2 96%   BMI 35.07 kg/m    Subjective:    Patient ID: Cassandra Robbins, female    DOB: 17-Dec-1970, 53 y.o.   MRN: [id number]  HPI: Cassandra Robbins is a 53 y.o. female  Chief Complaint  Patient presents with   Weight Check    Patient says today is her 3rd dose, but she hasn't taken it yet. Patient says she feels fine. Patient says she notices some nausea with it. Patient says she is still learning what the cut off is to not eat too much.    OBESITY- has taken 2 doses of wegovy so far Duration: chronic- worse recently Previous attempts at weight loss: yes, doing diet and exercise, working out 5 hours a week Complications of obesity: CAD, HLD, depression Peak weight: 255lb (245 since starting wegovy) Weight loss goal: under 200 Weight loss to date: 11lbs (1lb since starting wegovy) Requesting obesity pharmacotherapy: yes Current weight loss supplements/medications: yes Previous weight loss supplements/meds: no   Tolerating medicine well. Feeling well. No concerns.    Relevant past medical, surgical, family and social history reviewed and updated as indicated. Interim medical history since our last visit reviewed. Allergies and medications reviewed and updated.  Review of Systems  Constitutional: Negative.   Respiratory: Negative.    Cardiovascular: Negative.   Musculoskeletal: Negative.   Psychiatric/Behavioral: Negative.      Per HPI unless specifically indicated above     Objective:    BP 113/72   Pulse 74   Temp 98.7 F (37.1 C) (Oral)   Wt 244 lb 6.4 oz (110.9 kg)   SpO2 96%   BMI 35.07 kg/m   Wt Readings from Last 3 Encounters:  05/22/23 244 lb 6.4 oz (110.9 kg)  05/10/23 246 lb (111.6 kg)  04/03/23 245 lb 6.4 oz (111.3 kg)    Physical Exam Vitals and nursing note reviewed.  Constitutional:      General: She is not in acute distress.    Appearance: Normal  appearance. She is obese. She is not ill-appearing, toxic-appearing or diaphoretic.  HENT:     Head: Normocephalic and atraumatic.     Right Ear: External ear normal.     Left Ear: External ear normal.     Nose: Nose normal.     Mouth/Throat:     Mouth: Mucous membranes are moist.     Pharynx: Oropharynx is clear.  Eyes:     General: No scleral icterus.       Right eye: No discharge.        Left eye: No discharge.     Extraocular Movements: Extraocular movements intact.     Conjunctiva/sclera: Conjunctivae normal.     Pupils: Pupils are equal, round, and reactive to light.  Cardiovascular:     Rate and Rhythm: Normal rate and regular rhythm.     Pulses: Normal pulses.     Heart sounds: Normal heart sounds. No murmur heard.    No friction rub. No gallop.  Pulmonary:     Effort: Pulmonary effort is normal. No respiratory distress.     Breath sounds: Normal breath sounds. No stridor. No wheezing, rhonchi or rales.  Chest:     Chest wall: No tenderness.  Musculoskeletal:        General: Normal range of motion.     Cervical back: Normal  range of motion and neck supple.  Skin:    General: Skin is warm and dry.     Capillary Refill: Capillary refill takes less than 2 seconds.     Coloration: Skin is not jaundiced or pale.     Findings: No bruising, erythema, lesion or rash.  Neurological:     General: No focal deficit present.     Mental Status: She is alert and oriented to person, place, and time. Mental status is at baseline.  Psychiatric:        Mood and Affect: Mood normal.        Behavior: Behavior normal.        Thought Content: Thought content normal.        Judgment: Judgment normal.     Results for orders placed or performed in visit on 05/07/23  Lipid panel   Collection Time: 05/07/23 10:45 AM  Result Value Ref Range   Cholesterol, Total 180 100 - 199 mg/dL   Triglycerides 161 0 - 149 mg/dL   HDL 63 >09 mg/dL   VLDL Cholesterol Cal 18 5 - 40 mg/dL   LDL Chol  Calc (NIH) 99 0 - 99 mg/dL   Chol/HDL Ratio 2.9 0.0 - 4.4 ratio      Assessment & Plan:   Problem List Items Addressed This Visit       Other   BMI 35.0-35.9,adult (Chronic)   Tolerating medicine well. 2 weeks into the medicine and down at least 1 pound. Will continue titration up and recheck in about 6 weeks. Call with any concerns.       Obesity, morbid (HCC) - Primary   Due to CAD, HLD and depression. Tolerating medicine well. 2 weeks into the medicine and down at least 1 pound. Will continue titration up and recheck in about 6 weeks. Call with any concerns.       Relevant Medications   WEGOVY 0.25 MG/0.5ML SOAJ     Follow up plan: Return in about 7 weeks (around 07/10/2023), or before 07/16/23.

## 2023-05-22 NOTE — Assessment & Plan Note (Signed)
Tolerating medicine well. 2 weeks into the medicine and down at least 1 pound. Will continue titration up and recheck in about 6 weeks. Call with any concerns.

## 2023-05-28 ENCOUNTER — Encounter: Payer: Self-pay | Admitting: Family Medicine

## 2023-05-28 MED ORDER — KETOCONAZOLE 2 % EX CREA: 1.0000 | TOPICAL_CREAM | Freq: Every day | CUTANEOUS | 0 refills | Status: AC

## 2023-06-01 DIAGNOSIS — Z419 Encounter for procedure for purposes other than remedying health state, unspecified: Secondary | ICD-10-CM | POA: Diagnosis not present

## 2023-06-21 ENCOUNTER — Encounter: Payer: Self-pay | Admitting: Family Medicine

## 2023-06-22 ENCOUNTER — Encounter: Payer: Self-pay | Admitting: Family Medicine

## 2023-06-24 MED ORDER — AMPHETAMINE-DEXTROAMPHETAMINE 20 MG PO TABS: 20.0000 mg | ORAL_TABLET | Freq: Two times a day (BID) | ORAL | 0 refills | Status: DC

## 2023-06-29 DIAGNOSIS — Z419 Encounter for procedure for purposes other than remedying health state, unspecified: Secondary | ICD-10-CM | POA: Diagnosis not present

## 2023-07-11 ENCOUNTER — Encounter: Payer: Self-pay | Admitting: Family Medicine

## 2023-07-11 ENCOUNTER — Ambulatory Visit (INDEPENDENT_AMBULATORY_CARE_PROVIDER_SITE_OTHER): Payer: 59 | Admitting: Family Medicine

## 2023-07-11 VITALS — BP 116/78 | HR 66 | Temp 98.9°F | Resp 16 | Ht 70.0 in | Wt 239.8 lb

## 2023-07-11 DIAGNOSIS — F909 Attention-deficit hyperactivity disorder, unspecified type: Secondary | ICD-10-CM | POA: Diagnosis not present

## 2023-07-11 MED ORDER — AMPHETAMINE-DEXTROAMPHETAMINE 20 MG PO TABS: 20.0000 mg | ORAL_TABLET | Freq: Two times a day (BID) | ORAL | 0 refills | Status: DC

## 2023-07-11 MED ORDER — WEGOVY 0.5 MG/0.5ML ~~LOC~~ SOAJ: 0.5000 mg | SUBCUTANEOUS | 0 refills | Status: DC

## 2023-07-11 MED ORDER — AMPHETAMINE-DEXTROAMPHETAMINE 20 MG PO TABS
20.0000 mg | ORAL_TABLET | Freq: Two times a day (BID) | ORAL | 0 refills | Status: DC
Start: 2023-07-11 — End: 2023-08-22

## 2023-07-11 MED ORDER — WEGOVY 1 MG/0.5ML ~~LOC~~ SOAJ: 1.0000 mg | SUBCUTANEOUS | 2 refills | Status: DC

## 2023-07-11 NOTE — Progress Notes (Signed)
 BP 116/78 (BP Location: Left Arm, Patient Position: Sitting, Cuff Size: Large)   Pulse 66   Temp 98.9 F (37.2 C) (Oral)   Resp 16   Ht 5\' 10"  (1.778 m)   Wt 239 lb 12.8 oz (108.8 kg)   SpO2 99%   BMI 34.41 kg/m    Subjective:    Patient ID: Cassandra Robbins, female    DOB: [birth year], 53 y.o.   MRN: [id number]  HPI: Cassandra Robbins is a 53 y.o. female  Chief Complaint  Patient presents with   ADHD    Stable doing well.    Weight Loss    Will reapply for Wegovy through [facility] Medicaid. Needs rx sent to [facility].    WEIGHT GAIN- hasn't been on her medicine for about 2 weeks due to insurance issues Duration: chronic Previous attempts at weight loss: yes Complications of obesity: CAD, HLD,  Peak weight: 255 (246 when started wegovy) Weight loss goal: under 200 Weight loss to date: 16lbs (7lbs since starting medicine) Requesting obesity pharmacotherapy: yes Current weight loss supplements/medications: yes Previous weight loss supplements/meds: no  ADHD FOLLOW UP ADHD status: controlled Satisfied with current therapy: yes Medication compliance:  excellent compliance Controlled substance contract: no Previous psychiatry evaluation: no Previous medications: no    Taking meds on weekends/vacations: yes Work/school performance:  good Difficulty sustaining attention/completing tasks: no Distracted by extraneous stimuli: no Does not listen when spoken to: no  Fidgets with hands or feet: no Unable to stay in seat: no Blurts out/interrupts others: no ADHD Medication Side Effects: no    Decreased appetite: no    Headache: no    Sleeping disturbance pattern: no    Irritability: no    Rebound effects (worse than baseline) off medication: no    Anxiousness: no    Dizziness: no    Tics: no   Relevant past medical, surgical, family and social history reviewed and updated as indicated. Interim medical history since our last visit reviewed. Allergies and medications  reviewed and updated.  Review of Systems  Constitutional: Negative.   Respiratory: Negative.    Cardiovascular: Negative.   Gastrointestinal: Negative.   Musculoskeletal: Negative.   Skin: Negative.   Psychiatric/Behavioral: Negative.      Per HPI unless specifically indicated above     Objective:    BP 116/78 (BP Location: Left Arm, Patient Position: Sitting, Cuff Size: Large)   Pulse 66   Temp 98.9 F (37.2 C) (Oral)   Resp 16   Ht 5\' 10"  (1.778 m)   Wt 239 lb 12.8 oz (108.8 kg)   SpO2 99%   BMI 34.41 kg/m   Wt Readings from Last 3 Encounters:  07/11/23 239 lb 12.8 oz (108.8 kg)  05/22/23 244 lb 6.4 oz (110.9 kg)  05/10/23 246 lb (111.6 kg)    Physical Exam Vitals and nursing note reviewed.  Constitutional:      General: She is not in acute distress.    Appearance: Normal appearance. She is obese. She is not ill-appearing, toxic-appearing or diaphoretic.  HENT:     Head: Normocephalic and atraumatic.     Right Ear: External ear normal.     Left Ear: External ear normal.     Nose: Nose normal.     Mouth/Throat:     Mouth: Mucous membranes are moist.     Pharynx: Oropharynx is clear.  Eyes:     General: No scleral icterus.       Right eye: No discharge.  Left eye: No discharge.     Extraocular Movements: Extraocular movements intact.     Conjunctiva/sclera: Conjunctivae normal.     Pupils: Pupils are equal, round, and reactive to light.  Cardiovascular:     Rate and Rhythm: Normal rate and regular rhythm.     Pulses: Normal pulses.     Heart sounds: Normal heart sounds. No murmur heard.    No friction rub. No gallop.  Pulmonary:     Effort: Pulmonary effort is normal. No respiratory distress.     Breath sounds: Normal breath sounds. No stridor. No wheezing, rhonchi or rales.  Chest:     Chest wall: No tenderness.  Musculoskeletal:        General: Normal range of motion.     Cervical back: Normal range of motion and neck supple.  Skin:     General: Skin is warm and dry.     Capillary Refill: Capillary refill takes less than 2 seconds.     Coloration: Skin is not jaundiced or pale.     Findings: No bruising, erythema, lesion or rash.  Neurological:     General: No focal deficit present.     Mental Status: She is alert and oriented to person, place, and time. Mental status is at baseline.  Psychiatric:        Mood and Affect: Mood normal.        Behavior: Behavior normal.        Thought Content: Thought content normal.        Judgment: Judgment normal.     Results for orders placed or performed in visit on 05/07/23  Lipid panel   Collection Time: 05/07/23 10:45 AM  Result Value Ref Range   Cholesterol, Total 180 100 - 199 mg/dL   Triglycerides 045 0 - 149 mg/dL   HDL 63 >40 mg/dL   VLDL Cholesterol Cal 18 5 - 40 mg/dL   LDL Chol Calc (NIH) 99 0 - 99 mg/dL   Chol/HDL Ratio 2.9 0.0 - 4.4 ratio      Assessment & Plan:   Problem List Items Addressed This Visit       Other   Adult ADHD - Primary   Under good control on current regimen. Continue current regimen. Continue to monitor. Call with any concerns. Refills given for 3 months. Follow up 3 months.        Relevant Medications   amphetamine-dextroamphetamine (ADDERALL) 20 MG tablet (Start on 09/09/2023)   Obesity, morbid (HCC)   Down 2.8% of body weight. Tolerating her wegovy well. Will continue titration up to 1mg  and recheck in 6 weeks. Call with any concerns.       Relevant Medications   Semaglutide-Weight Management (WEGOVY) 0.5 MG/0.5ML SOAJ   Semaglutide-Weight Management (WEGOVY) 1 MG/0.5ML SOAJ   amphetamine-dextroamphetamine (ADDERALL) 20 MG tablet (Start on 09/09/2023)   amphetamine-dextroamphetamine (ADDERALL) 20 MG tablet (Start on 08/10/2023)   amphetamine-dextroamphetamine (ADDERALL) 20 MG tablet     Follow up plan: Return in about 6 weeks (around 08/22/2023).

## 2023-07-11 NOTE — Progress Notes (Signed)
 Clinical coverage for weight loss GLP's   Medication being dispensed is Wegovy 3 mL/28 day. Titration doses are 2 mL/28 days.   [x]  Product being prescribed is FDA approved for the indication, age, weight (if applicable) and not does not exceed dosing limits per the Prescribing Information per the clinical conditions for use.  [x]  Patient's baseline weight measured within the last 45 days as required by provider before dispensing.  [x]  Patient is weight management therapy and One of the following:   [x]  The beneficiary is 54 years of age or over and has ONE of the following:  [x]  A BMI greater than or equal to 30 kg/m2  []  A BMI greater than or equal to 27 kg/m2 with at least one weight-related comorbidity/risk factor/complication (i.e. hypertension, type 2 diabetes, obstructive sleep apnea, cardiovascular disease, dyslipidemia)   If patient has one weight-related comorbidity/risk factor/complication (i.e. hypertension, type 2 diabetes, obstructive sleep apnea, cardiovascular disease, dyslipidemia), please list  Patient suffers from weight-related comorbidity/risk factor/complication CAD S/P percutaneous coronary angioplasty and hyperlipidemia.    [x]  The beneficiary is 54 years of age or older with a BMI greater than or equal to 27 kg/m2 AND has established cardiovascular disease (CVD) defined as having a history of myocardial infarction, stroke, or symptomatic peripheral disease, to be documented on the PA form. AND  [x]  The beneficiary is currently on and will continue lifestyle modification including structured nutrition and physical activity, unless physical activity is not clinically appropriate at the time GLP1 therapy commences AND  [x]  The beneficiary will NOT be using the requested agent in combination with another GLP-1 receptor agonist agent AND  [x]  The beneficiary does NOT have any FDA-labeled contraindications to the requested agent, including pregnancy, lactation, history  of medullary thyroid cancer or multiple endocrine neoplasia type II.   Last BMI/Weight/Height recorded Estimated body mass index is 35.07 kg/m as calculated from the following:   Height as of 05/10/23: 5\' 10"  (1.778 m).   Weight as of 05/22/23: 244 lb 6.4 oz (110.9 kg).

## 2023-07-11 NOTE — Assessment & Plan Note (Signed)
 Under good control on current regimen. Continue current regimen. Continue to monitor. Call with any concerns. Refills given for 3 months. Follow up 3 months.

## 2023-07-11 NOTE — Assessment & Plan Note (Signed)
 Down 2.8% of body weight. Tolerating her wegovy well. Will continue titration up to 1mg  and recheck in 6 weeks. Call with any concerns.

## 2023-07-17 ENCOUNTER — Encounter: Payer: 59 | Admitting: Family Medicine

## 2023-08-10 DIAGNOSIS — Z419 Encounter for procedure for purposes other than remedying health state, unspecified: Secondary | ICD-10-CM | POA: Diagnosis not present

## 2023-08-22 ENCOUNTER — Encounter: Payer: Self-pay | Admitting: Family Medicine

## 2023-08-22 ENCOUNTER — Ambulatory Visit (INDEPENDENT_AMBULATORY_CARE_PROVIDER_SITE_OTHER): Admitting: Family Medicine

## 2023-08-22 VITALS — BP 149/92 | HR 69 | Ht 70.0 in | Wt 237.8 lb

## 2023-08-22 DIAGNOSIS — Z6835 Body mass index (BMI) 35.0-35.9, adult: Secondary | ICD-10-CM | POA: Diagnosis not present

## 2023-08-22 DIAGNOSIS — E669 Obesity, unspecified: Secondary | ICD-10-CM

## 2023-08-22 MED ORDER — WEGOVY 1.7 MG/0.75ML ~~LOC~~ SOAJ: 1.7000 mg | SUBCUTANEOUS | 0 refills | Status: DC

## 2023-08-22 MED ORDER — WEGOVY 2.4 MG/0.75ML ~~LOC~~ SOAJ: 2.4000 mg | SUBCUTANEOUS | 2 refills | Status: DC

## 2023-08-22 NOTE — Assessment & Plan Note (Signed)
 Tolerating her medicine well. Down 3.7% of her body weight. Will continue to titrate her up on her wegovy  and recheck in 6 weeks. Call with any concerns.

## 2023-08-22 NOTE — Progress Notes (Signed)
 BP (!) 149/92 (BP Location: Left Arm, Patient Position: Sitting, Cuff Size: Large)   Pulse 69   Ht 5\' 10"  (1.778 m)   Wt 237 lb 12.8 oz (107.9 kg)   SpO2 97%   BMI 34.12 kg/m    Subjective:    Patient ID: Cassandra Robbins, female    DOB: 09-15-1970, 53 y.o.   MRN: [id number]  HPI: Cassandra Robbins is a 53 y.o. female  Chief Complaint  Patient presents with   Obesity   OBESITY Duration: chronic Previous attempts at weight loss: yes Complications of obesity: CAD, HLD,  Peak weight: 255 (246 when started wegovy ) Weight loss goal: under 200 Weight loss to date: 18lbs (9lbs since starting medicine) Requesting obesity pharmacotherapy: yes Current weight loss supplements/medications: yes Previous weight loss supplements/meds: no  Relevant past medical, surgical, family and social history reviewed and updated as indicated. Interim medical history since our last visit reviewed. Allergies and medications reviewed and updated.  Review of Systems  Constitutional: Negative.   Respiratory: Negative.    Cardiovascular: Negative.   Musculoskeletal: Negative.   Neurological: Negative.   Psychiatric/Behavioral: Negative.      Per HPI unless specifically indicated above     Objective:    BP (!) 149/92 (BP Location: Left Arm, Patient Position: Sitting, Cuff Size: Large)   Pulse 69   Ht 5\' 10"  (1.778 m)   Wt 237 lb 12.8 oz (107.9 kg)   SpO2 97%   BMI 34.12 kg/m   Wt Readings from Last 3 Encounters:  08/22/23 237 lb 12.8 oz (107.9 kg)  07/11/23 239 lb 12.8 oz (108.8 kg)  05/22/23 244 lb 6.4 oz (110.9 kg)    Physical Exam Vitals and nursing note reviewed.  Constitutional:      General: She is not in acute distress.    Appearance: Normal appearance. She is not ill-appearing, toxic-appearing or diaphoretic.  HENT:     Head: Normocephalic and atraumatic.     Right Ear: External ear normal.     Left Ear: External ear normal.     Nose: Nose normal.     Mouth/Throat:      Mouth: Mucous membranes are moist.     Pharynx: Oropharynx is clear.  Eyes:     General: No scleral icterus.       Right eye: No discharge.        Left eye: No discharge.     Extraocular Movements: Extraocular movements intact.     Conjunctiva/sclera: Conjunctivae normal.     Pupils: Pupils are equal, round, and reactive to light.  Cardiovascular:     Rate and Rhythm: Normal rate and regular rhythm.     Pulses: Normal pulses.     Heart sounds: Normal heart sounds. No murmur heard.    No friction rub. No gallop.  Pulmonary:     Effort: Pulmonary effort is normal. No respiratory distress.     Breath sounds: Normal breath sounds. No stridor. No wheezing, rhonchi or rales.  Chest:     Chest wall: No tenderness.  Musculoskeletal:        General: Normal range of motion.     Cervical back: Normal range of motion and neck supple.  Skin:    General: Skin is warm and dry.     Capillary Refill: Capillary refill takes less than 2 seconds.     Coloration: Skin is not jaundiced or pale.     Findings: No bruising, erythema, lesion or rash.  Neurological:  General: No focal deficit present.     Mental Status: She is alert and oriented to person, place, and time. Mental status is at baseline.  Psychiatric:        Mood and Affect: Mood normal.        Behavior: Behavior normal.        Thought Content: Thought content normal.        Judgment: Judgment normal.     Results for orders placed or performed in visit on 05/07/23  Lipid panel   Collection Time: 05/07/23 10:45 AM  Result Value Ref Range   Cholesterol, Total 180 100 - 199 mg/dL   Triglycerides 161 0 - 149 mg/dL   HDL 63 >09 mg/dL   VLDL Cholesterol Cal 18 5 - 40 mg/dL   LDL Chol Calc (NIH) 99 0 - 99 mg/dL   Chol/HDL Ratio 2.9 0.0 - 4.4 ratio      Assessment & Plan:   Problem List Items Addressed This Visit       Other   BMI 35.0-35.9,adult - Primary (Chronic)   Tolerating her medicine well. Down 3.7% of her body weight.  Will continue to titrate her up on her wegovy  and recheck in 6 weeks. Call with any concerns.         Follow up plan: Return in about 6 weeks (around 10/03/2023).

## 2023-09-03 ENCOUNTER — Telehealth: Payer: Self-pay

## 2023-09-03 ENCOUNTER — Other Ambulatory Visit (HOSPITAL_COMMUNITY): Payer: Self-pay

## 2023-09-03 NOTE — Telephone Encounter (Signed)
 Pharmacy Patient Advocate Encounter   Received notification from CoverMyMeds that prior authorization for REPATHA  is required/requested.   Insurance verification completed.   The patient is insured through [facility] [place] .   Per test claim: PA required; PA submitted to above mentioned insurance via CoverMyMeds Key/confirmation #/EOC [id number] Status is pending

## 2023-09-03 NOTE — Telephone Encounter (Signed)
 Pharmacy Patient Advocate Encounter  Received notification from [facility] Medicaid that Prior Authorization for REPATHA  has been APPROVED from 09/03/23 to 09/02/24. Ran test claim, Copay is $4. This test claim was processed through [facility]- copay amounts may vary at other pharmacies due to pharmacy/plan contracts, or as the patient moves through the different stages of their insurance plan.

## 2023-09-09 DIAGNOSIS — Z419 Encounter for procedure for purposes other than remedying health state, unspecified: Secondary | ICD-10-CM | POA: Diagnosis not present

## 2023-10-03 ENCOUNTER — Encounter: Payer: Self-pay | Admitting: Family Medicine

## 2023-10-03 ENCOUNTER — Ambulatory Visit (INDEPENDENT_AMBULATORY_CARE_PROVIDER_SITE_OTHER): Admitting: Family Medicine

## 2023-10-03 VITALS — BP 124/80 | HR 78 | Temp 97.4°F | Wt 235.0 lb

## 2023-10-03 DIAGNOSIS — M609 Myositis, unspecified: Secondary | ICD-10-CM | POA: Diagnosis not present

## 2023-10-03 DIAGNOSIS — F909 Attention-deficit hyperactivity disorder, unspecified type: Secondary | ICD-10-CM

## 2023-10-03 DIAGNOSIS — T466X5A Adverse effect of antihyperlipidemic and antiarteriosclerotic drugs, initial encounter: Secondary | ICD-10-CM

## 2023-10-03 DIAGNOSIS — E782 Mixed hyperlipidemia: Secondary | ICD-10-CM | POA: Diagnosis not present

## 2023-10-03 LAB — BAYER DCA HB A1C WAIVED: HB A1C (BAYER DCA - WAIVED): 5.7 % — ABNORMAL HIGH (ref 4.8–5.6)

## 2023-10-03 MED ORDER — AMPHETAMINE-DEXTROAMPHETAMINE 20 MG PO TABS
20.0000 mg | ORAL_TABLET | Freq: Two times a day (BID) | ORAL | 0 refills | Status: DC
Start: 2023-10-09 — End: 2023-11-18

## 2023-10-03 MED ORDER — AMPHETAMINE-DEXTROAMPHETAMINE 20 MG PO TABS: 20.0000 mg | ORAL_TABLET | Freq: Two times a day (BID) | ORAL | 0 refills | Status: AC

## 2023-10-03 MED ORDER — AMPHETAMINE-DEXTROAMPHETAMINE 20 MG PO TABS: 20.0000 mg | ORAL_TABLET | Freq: Two times a day (BID) | ORAL | 0 refills | Status: DC

## 2023-10-03 NOTE — Progress Notes (Signed)
 BP 124/80   Pulse 78   Temp (!) 97.4 F (36.3 C) (Oral)   Wt 235 lb (106.6 kg)   SpO2 100%   BMI 33.72 kg/m    Subjective:    Patient ID: Cassandra Robbins, female    DOB: 25-Feb-1971, 53 y.o.   MRN: [id number]  HPI: Cassandra Robbins is a 53 y.o. female  Chief Complaint  Patient presents with   Obesity   ADHD   OBESITY- just took her last 1.7mg  wegovy  on Tuesday. Tolerating her medicine well. Has had some heartburn, but otherwise feeling well.  Duration: chronic Previous attempts at weight loss: yes Complications of obesity: CAD, HLD,  Peak weight: 255 (246 when started wegovy ) Weight loss goal: under 200 Weight loss to date: 20lbs (11lbs since starting medicine) Requesting obesity pharmacotherapy: yes Current weight loss supplements/medications: yes Previous weight loss supplements/meds: no  ADHD FOLLOW UP ADHD status: controlled Satisfied with current therapy: yes Medication compliance:  excellent compliance Controlled substance contract: yes Previous psychiatry evaluation: yes Previous medications: yes    Taking meds on weekends/vacations: occasionally Work/school performance:  good Difficulty sustaining attention/completing tasks: no Distracted by extraneous stimuli: no Does not listen when spoken to: no  Fidgets with hands or feet: no Unable to stay in seat: no Blurts out/interrupts others: no ADHD Medication Side Effects: no    Decreased appetite: no    Headache: no    Sleeping disturbance pattern: no    Irritability: no    Rebound effects (worse than baseline) off medication: no    Anxiousness: no    Dizziness: no    Tics: no   HYPERLIPIDEMIA Hyperlipidemia status: excellent compliance Satisfied with current treatment?  yes Side effects:  yes Medication compliance: excellent compliance Past cholesterol meds: repatha  Supplements: none Aspirin :  yes The 10-year ASCVD risk score (Arnett DK, et al., 2019) is: 1.3%   Values used to calculate the  score:     Age: 52 years     Sex: Female     Is Non-Hispanic African American: No     Diabetic: No     Tobacco smoker: No     Systolic Blood Pressure: 124 mmHg     Is BP treated: No     HDL Cholesterol: 63 mg/dL     Total Cholesterol: 180 mg/dL Chest pain:  no Coronary artery disease:  yes  Relevant past medical, surgical, family and social history reviewed and updated as indicated. Interim medical history since our last visit reviewed. Allergies and medications reviewed and updated.  Review of Systems  Constitutional: Negative.   Respiratory: Negative.    Cardiovascular: Negative.   Musculoskeletal: Negative.   Neurological: Negative.   Psychiatric/Behavioral: Negative.      Per HPI unless specifically indicated above     Objective:     BP 124/80   Pulse 78   Temp (!) 97.4 F (36.3 C) (Oral)   Wt 235 lb (106.6 kg)   SpO2 100%   BMI 33.72 kg/m   Wt Readings from Last 3 Encounters:  10/03/23 235 lb (106.6 kg)  08/22/23 237 lb 12.8 oz (107.9 kg)  07/11/23 239 lb 12.8 oz (108.8 kg)    Physical Exam Vitals and nursing note reviewed.  Constitutional:      General: She is not in acute distress.    Appearance: Normal appearance. She is obese. She is not ill-appearing, toxic-appearing or diaphoretic.  HENT:     Head: Normocephalic and atraumatic.     Right  Ear: External ear normal.     Left Ear: External ear normal.     Nose: Nose normal.     Mouth/Throat:     Mouth: Mucous membranes are moist.     Pharynx: Oropharynx is clear.  Eyes:     General: No scleral icterus.       Right eye: No discharge.        Left eye: No discharge.     Extraocular Movements: Extraocular movements intact.     Conjunctiva/sclera: Conjunctivae normal.     Pupils: Pupils are equal, round, and reactive to light.  Cardiovascular:     Rate and Rhythm: Normal rate and regular rhythm.     Pulses: Normal pulses.     Heart sounds: Normal heart sounds. No murmur heard.    No friction rub.  No gallop.  Pulmonary:     Effort: Pulmonary effort is normal. No respiratory distress.     Breath sounds: Normal breath sounds. No stridor. No wheezing, rhonchi or rales.  Chest:     Chest wall: No tenderness.  Musculoskeletal:        General: Normal range of motion.     Cervical back: Normal range of motion and neck supple.  Skin:    General: Skin is warm and dry.     Capillary Refill: Capillary refill takes less than 2 seconds.     Coloration: Skin is not jaundiced or pale.     Findings: No bruising, erythema, lesion or rash.  Neurological:     General: No focal deficit present.     Mental Status: She is alert and oriented to person, place, and time. Mental status is at baseline.  Psychiatric:        Mood and Affect: Mood normal.        Behavior: Behavior normal.        Thought Content: Thought content normal.        Judgment: Judgment normal.     Results for orders placed or performed in visit on 05/07/23  Lipid panel   Collection Time: 05/07/23 10:45 AM  Result Value Ref Range   Cholesterol, Total 180 100 - 199 mg/dL   Triglycerides 696 0 - 149 mg/dL   HDL 63 >29 mg/dL   VLDL Cholesterol Cal 18 5 - 40 mg/dL   LDL Chol Calc (NIH) 99 0 - 99 mg/dL   Chol/HDL Ratio 2.9 0.0 - 4.4 ratio      Assessment & Plan:   Problem List Items Addressed This Visit       Musculoskeletal and Integument   Statin-induced myositis   Unable to tolerate statins. Continue repatha . Call with any concerns.         Other   Hyperlipidemia (Chronic)   Under good control on current regimen. Continue current regimen. Continue to monitor. Call with any concerns. Refills given. Labs drawn today.        Relevant Orders   CBC with Differential/Platelet   Comprehensive metabolic panel with GFR   Lipid Panel w/o Chol/HDL Ratio   Adult ADHD - Primary   Under good control on current regimen. Continue current regimen. Continue to monitor. Call with any concerns. Refills given for 3 months.  Follow up 3 months.        Relevant Orders   CBC with Differential/Platelet   Comprehensive metabolic panel with GFR   Obesity, morbid (HCC)   Down 4.47% of her body weight, not on max dose of wegovy . Will continue titration and  recheck in 6 weeks. Call with any concerns.       Relevant Medications   amphetamine -dextroamphetamine  (ADDERALL) 20 MG tablet (Start on 10/09/2023)   amphetamine -dextroamphetamine  (ADDERALL) 20 MG tablet (Start on 11/08/2023)   amphetamine -dextroamphetamine  (ADDERALL) 20 MG tablet (Start on 12/08/2023)   Other Relevant Orders   Bayer DCA Hb A1c Waived   CBC with Differential/Platelet   Comprehensive metabolic panel with GFR     Follow up plan: Return in about 6 weeks (around 11/14/2023).

## 2023-10-03 NOTE — Assessment & Plan Note (Signed)
 Under good control on current regimen. Continue current regimen. Continue to monitor. Call with any concerns. Refills given. Labs drawn today.

## 2023-10-03 NOTE — Assessment & Plan Note (Signed)
 Unable to tolerate statins. Continue repatha. Call with any concerns.

## 2023-10-03 NOTE — Assessment & Plan Note (Signed)
 Under good control on current regimen. Continue current regimen. Continue to monitor. Call with any concerns. Refills given for 3 months. Follow up 3 months.

## 2023-10-03 NOTE — Assessment & Plan Note (Signed)
 Down 4.47% of her body weight, not on max dose of wegovy . Will continue titration and recheck in 6 weeks. Call with any concerns.

## 2023-10-04 ENCOUNTER — Telehealth: Payer: Self-pay

## 2023-10-04 ENCOUNTER — Other Ambulatory Visit (HOSPITAL_COMMUNITY): Payer: Self-pay

## 2023-10-04 ENCOUNTER — Ambulatory Visit: Admitting: Family Medicine

## 2023-10-04 ENCOUNTER — Encounter: Payer: Self-pay | Admitting: Family Medicine

## 2023-10-04 LAB — CBC WITH DIFFERENTIAL/PLATELET
Basophils Absolute: 0.1 10*3/uL (ref 0.0–0.2)
Basos: 1 %
EOS (ABSOLUTE): 0.2 10*3/uL (ref 0.0–0.4)
Eos: 4 %
Hematocrit: 44.2 % (ref 34.0–46.6)
Hemoglobin: 14.3 g/dL (ref 11.1–15.9)
Immature Grans (Abs): 0 10*3/uL (ref 0.0–0.1)
Immature Granulocytes: 0 %
Lymphocytes Absolute: 2.3 10*3/uL (ref 0.7–3.1)
Lymphs: 40 %
MCH: 28.8 pg (ref 26.6–33.0)
MCHC: 32.4 g/dL (ref 31.5–35.7)
MCV: 89 fL (ref 79–97)
Monocytes Absolute: 0.3 10*3/uL (ref 0.1–0.9)
Monocytes: 5 %
Neutrophils Absolute: 2.9 10*3/uL (ref 1.4–7.0)
Neutrophils: 50 %
Platelets: 348 10*3/uL (ref 150–450)
RBC: 4.97 x10E6/uL (ref 3.77–5.28)
RDW: 12.8 % (ref 11.7–15.4)
WBC: 5.7 10*3/uL (ref 3.4–10.8)

## 2023-10-04 LAB — COMPREHENSIVE METABOLIC PANEL WITH GFR
ALT: 19 IU/L (ref 0–32)
AST: 19 IU/L (ref 0–40)
Albumin: 4.6 g/dL (ref 3.8–4.9)
Alkaline Phosphatase: 134 IU/L — ABNORMAL HIGH (ref 44–121)
BUN/Creatinine Ratio: 24 — ABNORMAL HIGH (ref 9–23)
BUN: 18 mg/dL (ref 6–24)
Bilirubin Total: 0.2 mg/dL (ref 0.0–1.2)
CO2: 23 mmol/L (ref 20–29)
Calcium: 9.4 mg/dL (ref 8.7–10.2)
Chloride: 103 mmol/L (ref 96–106)
Creatinine, Ser: 0.75 mg/dL (ref 0.57–1.00)
Globulin, Total: 2.8 g/dL (ref 1.5–4.5)
Glucose: 83 mg/dL (ref 70–99)
Potassium: 4.4 mmol/L (ref 3.5–5.2)
Sodium: 140 mmol/L (ref 134–144)
Total Protein: 7.4 g/dL (ref 6.0–8.5)
eGFR: 95 mL/min/{1.73_m2} (ref >59)

## 2023-10-04 LAB — LIPID PANEL W/O CHOL/HDL RATIO
Cholesterol, Total: 228 mg/dL — ABNORMAL HIGH (ref 100–199)
HDL: 59 mg/dL (ref >39)
LDL Chol Calc (NIH): 158 mg/dL — ABNORMAL HIGH (ref 0–99)
Triglycerides: 66 mg/dL (ref 0–149)
VLDL Cholesterol Cal: 11 mg/dL (ref 5–40)

## 2023-10-04 NOTE — Telephone Encounter (Signed)
 Pharmacy Patient Advocate Encounter   Received notification from Onbase that prior authorization for Wegovy  2.4MG /0.75ML auto-injectors  is required/requested.   Insurance verification completed.   The patient is insured through [facility] [place] .   Per test claim: PA required; PA submitted to above mentioned insurance via CoverMyMeds Key/confirmation #/EOC [id number] Status is pending

## 2023-10-07 ENCOUNTER — Other Ambulatory Visit (HOSPITAL_COMMUNITY): Payer: Self-pay

## 2023-10-07 ENCOUNTER — Ambulatory Visit: Payer: Self-pay | Admitting: Family Medicine

## 2023-10-07 NOTE — Telephone Encounter (Signed)
 PA request has been Approved. New Encounter has been or will be created for follow up. For additional info see Pharmacy Prior Auth telephone encounter from 10/04/2023.

## 2023-10-07 NOTE — Telephone Encounter (Signed)
 Pharmacy Patient Advocate Encounter  Received notification from [facility] [employer] that Prior Authorization for Wegovy  2.4MG /0.75ML auto-injectors  has been APPROVED from 10/04/23 to 10/03/24. Ran test claim, Copay is $4. This test claim was processed through [facility]- copay amounts may vary at other pharmacies due to pharmacy/plan contracts, or as the patient moves through the different stages of their insurance plan.   PA #/Case ID/Reference #: Cassandra Robbins

## 2023-10-10 DIAGNOSIS — Z419 Encounter for procedure for purposes other than remedying health state, unspecified: Secondary | ICD-10-CM | POA: Diagnosis not present

## 2023-11-09 DIAGNOSIS — Z419 Encounter for procedure for purposes other than remedying health state, unspecified: Secondary | ICD-10-CM | POA: Diagnosis not present

## 2023-11-18 ENCOUNTER — Encounter: Payer: Self-pay | Admitting: Family Medicine

## 2023-11-18 ENCOUNTER — Ambulatory Visit: Admitting: Family Medicine

## 2023-11-18 DIAGNOSIS — M65341 Trigger finger, right ring finger: Secondary | ICD-10-CM | POA: Diagnosis not present

## 2023-11-18 NOTE — Assessment & Plan Note (Signed)
 Congratulated patient on continued weight loss! Down 6.1% of her body weight since starting wegovy . Continue current regimen. Continue to monitor. Call with any concerns.

## 2023-11-18 NOTE — Progress Notes (Signed)
 BP 120/80   Pulse 73   Temp 97.6 F (36.4 C) (Oral)   Ht 5' 10 (1.778 m)   Wt 229 lb (103.9 kg)   SpO2 99%   BMI 32.86 kg/m    Subjective:    Patient ID: Cassandra Robbins Sheen, female    DOB: [birth year], 53 y.o.   MRN: [id number]  HPI: Cassandra Robbins is a 53 y.o. female  Chief Complaint  Patient presents with   Obesity   OBESITY- Tolerating her medicine well. Has had some heartburn, but otherwise feeling well.  Duration: chronic Previous attempts at weight loss: yes Complications of obesity: CAD, HLD,  Peak weight: 255 (246 when started wegovy ) Weight loss goal: under 200 Weight loss to date: 26lbs (17lbs since starting medicine) Requesting obesity pharmacotherapy: yes Current weight loss supplements/medications: yes Previous weight loss supplements/meds: no  Relevant past medical, surgical, family and social history reviewed and updated as indicated. Interim medical history since our last visit reviewed. Allergies and medications reviewed and updated.  Review of Systems  Constitutional: Negative.   Respiratory: Negative.    Cardiovascular: Negative.   Gastrointestinal: Negative.  Negative for abdominal distention, abdominal pain, anal bleeding, blood in stool, constipation, diarrhea, nausea, rectal pain and vomiting.       + heartburn  Musculoskeletal: Negative.   Psychiatric/Behavioral: Negative.      Per HPI unless specifically indicated above     Objective:    BP 120/80   Pulse 73   Temp 97.6 F (36.4 C) (Oral)   Ht 5' 10 (1.778 m)   Wt 229 lb (103.9 kg)   SpO2 99%   BMI 32.86 kg/m   Wt Readings from Last 3 Encounters:  11/18/23 229 lb (103.9 kg)  10/03/23 235 lb (106.6 kg)  08/22/23 237 lb 12.8 oz (107.9 kg)    Physical Exam Vitals and nursing note reviewed.  Constitutional:      General: She is not in acute distress.    Appearance: Normal appearance. She is obese. She is not ill-appearing, toxic-appearing or diaphoretic.  HENT:     Head:  Normocephalic and atraumatic.     Right Ear: External ear normal.     Left Ear: External ear normal.     Nose: Nose normal.     Mouth/Throat:     Mouth: Mucous membranes are moist.     Pharynx: Oropharynx is clear.  Eyes:     General: No scleral icterus.       Right eye: No discharge.        Left eye: No discharge.     Extraocular Movements: Extraocular movements intact.     Conjunctiva/sclera: Conjunctivae normal.     Pupils: Pupils are equal, round, and reactive to light.  Cardiovascular:     Rate and Rhythm: Normal rate and regular rhythm.     Pulses: Normal pulses.     Heart sounds: Normal heart sounds. No murmur heard.    No friction rub. No gallop.  Pulmonary:     Effort: Pulmonary effort is normal. No respiratory distress.     Breath sounds: Normal breath sounds. No stridor. No wheezing, rhonchi or rales.  Chest:     Chest wall: No tenderness.  Musculoskeletal:        General: Normal range of motion.     Cervical back: Normal range of motion and neck supple.  Skin:    General: Skin is warm and dry.     Capillary Refill: Capillary refill takes less  than 2 seconds.     Coloration: Skin is not jaundiced or pale.     Findings: No bruising, erythema, lesion or rash.  Neurological:     General: No focal deficit present.     Mental Status: She is alert and oriented to person, place, and time. Mental status is at baseline.  Psychiatric:        Mood and Affect: Mood normal.        Behavior: Behavior normal.        Thought Content: Thought content normal.        Judgment: Judgment normal.     Results for orders placed or performed in visit on 10/03/23  Bayer DCA Hb A1c Waived   Collection Time: 10/03/23 11:43 AM  Result Value Ref Range   HB A1C (BAYER DCA - WAIVED) 5.7 (H) 4.8 - 5.6 %  CBC with Differential/Platelet   Collection Time: 10/03/23 11:44 AM  Result Value Ref Range   WBC 5.7 3.4 - 10.8 x10E3/uL   RBC 4.97 3.77 - 5.28 x10E6/uL   Hemoglobin 14.3 11.1 - 15.9  g/dL   Hematocrit 55.7 65.9 - 46.6 %   MCV 89 79 - 97 fL   MCH 28.8 26.6 - 33.0 pg   MCHC 32.4 31.5 - 35.7 g/dL   RDW 87.1 88.2 - 84.5 %   Platelets 348 150 - 450 x10E3/uL   Neutrophils 50 Not Estab. %   Lymphs 40 Not Estab. %   Monocytes 5 Not Estab. %   Eos 4 Not Estab. %   Basos 1 Not Estab. %   Neutrophils Absolute 2.9 1.4 - 7.0 x10E3/uL   Lymphocytes Absolute 2.3 0.7 - 3.1 x10E3/uL   Monocytes Absolute 0.3 0.1 - 0.9 x10E3/uL   EOS (ABSOLUTE) 0.2 0.0 - 0.4 x10E3/uL   Basophils Absolute 0.1 0.0 - 0.2 x10E3/uL   Immature Granulocytes 0 Not Estab. %   Immature Grans (Abs) 0.0 0.0 - 0.1 x10E3/uL  Comprehensive metabolic panel with GFR   Collection Time: 10/03/23 11:44 AM  Result Value Ref Range   Glucose 83 70 - 99 mg/dL   BUN 18 6 - 24 mg/dL   Creatinine, Ser 9.24 0.57 - 1.00 mg/dL   eGFR 95 >40 fO/fpw/8.26   BUN/Creatinine Ratio 24 (H) 9 - 23   Sodium 140 134 - 144 mmol/L   Potassium 4.4 3.5 - 5.2 mmol/L   Chloride 103 96 - 106 mmol/L   CO2 23 20 - 29 mmol/L   Calcium  9.4 8.7 - 10.2 mg/dL   Total Protein 7.4 6.0 - 8.5 g/dL   Albumin 4.6 3.8 - 4.9 g/dL   Globulin, Total 2.8 1.5 - 4.5 g/dL   Bilirubin Total 0.2 0.0 - 1.2 mg/dL   Alkaline Phosphatase 134 (H) 44 - 121 IU/L   AST 19 0 - 40 IU/L   ALT 19 0 - 32 IU/L  Lipid Panel w/o Chol/HDL Ratio   Collection Time: 10/03/23 11:44 AM  Result Value Ref Range   Cholesterol, Total 228 (H) 100 - 199 mg/dL   Triglycerides 66 0 - 149 mg/dL   HDL 59 >60 mg/dL   VLDL Cholesterol Cal 11 5 - 40 mg/dL   LDL Chol Calc (NIH) 841 (H) 0 - 99 mg/dL      Assessment & Plan:   Problem List Items Addressed This Visit       Other   Obesity, morbid (HCC) - Primary   Congratulated patient on continued weight loss! Down 6.1% of  her body weight since starting wegovy . Continue current regimen. Continue to monitor. Call with any concerns.         Follow up plan: Return in about 6 weeks (around 12/30/2023) for virtual OK.

## 2023-11-18 NOTE — Addendum Note (Signed)
 Addended by: VICCI DUWAINE SQUIBB on: 11/18/2023 09:29 AM   Modules accepted: Orders

## 2023-11-26 ENCOUNTER — Other Ambulatory Visit: Payer: Self-pay | Admitting: Cardiology

## 2023-12-23 ENCOUNTER — Other Ambulatory Visit: Payer: Self-pay | Admitting: Family Medicine

## 2023-12-25 NOTE — Telephone Encounter (Signed)
 Requested Prescriptions  Pending Prescriptions Disp Refills   semaglutide -weight management (WEGOVY ) 2.4 MG/0.75ML SOAJ SQ injection [Pharmacy Med Name: WEGOVY  2.4MG /0.75ML INJ (4 PENS)] 3 mL 2    Sig: ADMINISTER 2.4 MG UNDER THE SKIN 1 TIME A WEEK     Endocrinology:  Diabetes - GLP-1 Receptor Agonists - semaglutide  Failed - 12/25/2023  9:18 AM      Failed - HBA1C in normal range and within 180 days    HB A1C (BAYER DCA - WAIVED)  Date Value Ref Range Status  10/03/2023 5.7 (H) 4.8 - 5.6 % Final    Comment:             Prediabetes: 5.7 - 6.4          Diabetes: >6.4          Glycemic control for adults with diabetes: <7.0          Passed - Cr in normal range and within 360 days    Creatinine, Ser  Date Value Ref Range Status  10/03/2023 0.75 0.57 - 1.00 mg/dL Final         Passed - Valid encounter within last 6 months    Recent Outpatient Visits           1 month ago Obesity, morbid (HCC)   [place] [facility] [place], Megan P, DO   2 months ago Adult ADHD   [place] [facility] [place], Megan P, DO   4 months ago BMI 35.0-35.9,adult   [place] [facility] [place], Megan P, DO   5 months ago Adult ADHD   [place] [facility] [place], [place], DO

## 2024-01-02 ENCOUNTER — Encounter: Payer: Self-pay | Admitting: Family Medicine

## 2024-01-02 ENCOUNTER — Telehealth: Payer: Self-pay | Admitting: Family Medicine

## 2024-01-02 VITALS — Ht 70.0 in | Wt 223.0 lb

## 2024-01-02 DIAGNOSIS — F909 Attention-deficit hyperactivity disorder, unspecified type: Secondary | ICD-10-CM

## 2024-01-02 MED ORDER — WEGOVY 2.4 MG/0.75ML ~~LOC~~ SOAJ: 2.4000 mg | SUBCUTANEOUS | 1 refills | Status: AC

## 2024-01-02 MED ORDER — AMPHETAMINE-DEXTROAMPHETAMINE 20 MG PO TABS: 20.0000 mg | ORAL_TABLET | Freq: Two times a day (BID) | ORAL | 0 refills | Status: AC

## 2024-01-02 NOTE — Progress Notes (Signed)
 Scheduled

## 2024-01-02 NOTE — Progress Notes (Signed)
 Ht 5' 10 (1.778 m)   Wt 223 lb (101.2 kg)   BMI 32.00 kg/m    Subjective:    Patient ID: Cassandra Robbins, female    DOB: 1970/12/26, 53 y.o.   MRN: [id number]  HPI: Cassandra Robbins is a 53 y.o. female  Chief Complaint  Patient presents with   Obesity    Wegovy  overall doing well on it and no concerns.    ADHD    Adderal going well and no issues.    OBESITY- Tolerating her medicine well. She still has had some heartburn, but otherwise feeling well. Goes away with omeprazole Duration: chronic Previous attempts at weight loss: yes Complications of obesity: CAD, HLD,  Peak weight: 255 (246 when started wegovy ) Weight loss goal: under 200 Weight loss to date: 32lbs (23lbs since starting medicine) Requesting obesity pharmacotherapy: yes Current weight loss supplements/medications: yes Previous weight loss supplements/meds: no  ADHD FOLLOW UP ADHD status: controlled Satisfied with current therapy: yes Medication compliance:  excellent compliance Controlled substance contract: yes Previous psychiatry evaluation: yes Previous medications: yes    Taking meds on weekends/vacations: yes Work/school performance:  excellent Difficulty sustaining attention/completing tasks: no Distracted by extraneous stimuli: no Does not listen when spoken to: no  Fidgets with hands or feet: no Unable to stay in seat: no Blurts out/interrupts others: no ADHD Medication Side Effects: no    Decreased appetite: no    Headache: no    Sleeping disturbance pattern: no    Irritability: no    Rebound effects (worse than baseline) off medication: no    Anxiousness: no    Dizziness: no    Tics: no   Relevant past medical, surgical, family and social history reviewed and updated as indicated. Interim medical history since our last visit reviewed. Allergies and medications reviewed and updated.  Review of Systems  Constitutional: Negative.   Respiratory: Negative.    Cardiovascular: Negative.    Gastrointestinal: Negative.   Musculoskeletal: Negative.   Neurological: Negative.   Psychiatric/Behavioral: Negative.      Per HPI unless specifically indicated above     Objective:    Ht 5' 10 (1.778 m)   Wt 223 lb (101.2 kg)   BMI 32.00 kg/m   Wt Readings from Last 3 Encounters:  01/02/24 223 lb (101.2 kg)  11/18/23 229 lb (103.9 kg)  10/03/23 235 lb (106.6 kg)    Physical Exam Vitals and nursing note reviewed.  Constitutional:      General: She is not in acute distress.    Appearance: Normal appearance. She is not ill-appearing, toxic-appearing or diaphoretic.  HENT:     Head: Normocephalic and atraumatic.     Right Ear: External ear normal.     Left Ear: External ear normal.     Nose: Nose normal.     Mouth/Throat:     Mouth: Mucous membranes are moist.     Pharynx: Oropharynx is clear.  Eyes:     General: No scleral icterus.       Right eye: No discharge.        Left eye: No discharge.     Conjunctiva/sclera: Conjunctivae normal.     Pupils: Pupils are equal, round, and reactive to light.  Pulmonary:     Effort: Pulmonary effort is normal. No respiratory distress.     Comments: Speaking in full sentences Musculoskeletal:        General: Normal range of motion.     Cervical back: Normal range of  motion.  Skin:    Coloration: Skin is not jaundiced or pale.     Findings: No bruising, erythema, lesion or rash.  Neurological:     Mental Status: She is alert and oriented to person, place, and time. Mental status is at baseline.  Psychiatric:        Mood and Affect: Mood normal.        Behavior: Behavior normal.        Thought Content: Thought content normal.        Judgment: Judgment normal.     Results for orders placed or performed in visit on 10/03/23  Bayer DCA Hb A1c Waived   Collection Time: 10/03/23 11:43 AM  Result Value Ref Range   HB A1C (BAYER DCA - WAIVED) 5.7 (H) 4.8 - 5.6 %  CBC with Differential/Platelet   Collection Time: 10/03/23  11:44 AM  Result Value Ref Range   WBC 5.7 3.4 - 10.8 x10E3/uL   RBC 4.97 3.77 - 5.28 x10E6/uL   Hemoglobin 14.3 11.1 - 15.9 g/dL   Hematocrit 55.7 65.9 - 46.6 %   MCV 89 79 - 97 fL   MCH 28.8 26.6 - 33.0 pg   MCHC 32.4 31.5 - 35.7 g/dL   RDW 87.1 88.2 - 84.5 %   Platelets 348 150 - 450 x10E3/uL   Neutrophils 50 Not Estab. %   Lymphs 40 Not Estab. %   Monocytes 5 Not Estab. %   Eos 4 Not Estab. %   Basos 1 Not Estab. %   Neutrophils Absolute 2.9 1.4 - 7.0 x10E3/uL   Lymphocytes Absolute 2.3 0.7 - 3.1 x10E3/uL   Monocytes Absolute 0.3 0.1 - 0.9 x10E3/uL   EOS (ABSOLUTE) 0.2 0.0 - 0.4 x10E3/uL   Basophils Absolute 0.1 0.0 - 0.2 x10E3/uL   Immature Granulocytes 0 Not Estab. %   Immature Grans (Abs) 0.0 0.0 - 0.1 x10E3/uL  Comprehensive metabolic panel with GFR   Collection Time: 10/03/23 11:44 AM  Result Value Ref Range   Glucose 83 70 - 99 mg/dL   BUN 18 6 - 24 mg/dL   Creatinine, Ser 9.24 0.57 - 1.00 mg/dL   eGFR 95 >40 fO/fpw/8.26   BUN/Creatinine Ratio 24 (H) 9 - 23   Sodium 140 134 - 144 mmol/L   Potassium 4.4 3.5 - 5.2 mmol/L   Chloride 103 96 - 106 mmol/L   CO2 23 20 - 29 mmol/L   Calcium  9.4 8.7 - 10.2 mg/dL   Total Protein 7.4 6.0 - 8.5 g/dL   Albumin 4.6 3.8 - 4.9 g/dL   Globulin, Total 2.8 1.5 - 4.5 g/dL   Bilirubin Total 0.2 0.0 - 1.2 mg/dL   Alkaline Phosphatase 134 (H) 44 - 121 IU/L   AST 19 0 - 40 IU/L   ALT 19 0 - 32 IU/L  Lipid Panel w/o Chol/HDL Ratio   Collection Time: 10/03/23 11:44 AM  Result Value Ref Range   Cholesterol, Total 228 (H) 100 - 199 mg/dL   Triglycerides 66 0 - 149 mg/dL   HDL 59 >60 mg/dL   VLDL Cholesterol Cal 11 5 - 40 mg/dL   LDL Chol Calc (NIH) 841 (H) 0 - 99 mg/dL      Assessment & Plan:   Problem List Items Addressed This Visit       Other   Adult ADHD - Primary   Obesity, morbid (HCC)   Relevant Medications   semaglutide -weight management (WEGOVY ) 2.4 MG/0.75ML SOAJ SQ injection   amphetamine -dextroamphetamine   (  ADDERALL) 20 MG tablet (Start on 01/07/2024)   amphetamine -dextroamphetamine  (ADDERALL) 20 MG tablet (Start on 02/06/2024)   amphetamine -dextroamphetamine  (ADDERALL) 20 MG tablet (Start on 03/07/2024)     Follow up plan: Return in about 3 months (around 04/03/2024) for physical.    This visit was completed via video visit through MyChart due to the restrictions of the COVID-19 pandemic. All issues as above were discussed and addressed. Physical exam was done as above through visual confirmation on video through MyChart. If it was felt that the patient should be evaluated in the office, they were directed there. The patient verbally consented to this visit. Location of the patient: home Location of the provider: work Those involved with this call:  Provider: Duwaine Louder, DO CMA: York Fogo, CMA, Front Desk/Registration: Claretta Maiden  Time spent on call: 25 minutes with patient face to face via video conference. More than 50% of this time was spent in counseling and coordination of care. 40 minutes total spent in review of patient's record and preparation of their chart.

## 2024-01-06 ENCOUNTER — Encounter: Payer: Self-pay | Admitting: Family Medicine

## 2024-04-06 ENCOUNTER — Encounter: Payer: Self-pay | Admitting: Family Medicine

## 2024-04-24 ENCOUNTER — Other Ambulatory Visit (HOSPITAL_COMMUNITY): Payer: Self-pay

## 2024-05-08 ENCOUNTER — Encounter: Payer: Self-pay | Admitting: Family Medicine

## 2024-06-16 ENCOUNTER — Encounter: Admitting: Family Medicine
